# Patient Record
Sex: Female | Born: 1960 | Race: White | Hispanic: No | Marital: Married | Smoking: Former smoker
Health system: Southern US, Community
[De-identification: ages and names within clinical notes are randomized; demographics above are authoritative.]

## PROBLEM LIST (undated history)

## (undated) DIAGNOSIS — Z940 Kidney transplant status: Secondary | ICD-10-CM

## (undated) DIAGNOSIS — C50919 Malignant neoplasm of unspecified site of unspecified female breast: Secondary | ICD-10-CM

## (undated) DIAGNOSIS — Z8679 Personal history of other diseases of the circulatory system: Secondary | ICD-10-CM

## (undated) DIAGNOSIS — Z8632 Personal history of gestational diabetes: Secondary | ICD-10-CM

## (undated) DIAGNOSIS — D631 Anemia in chronic kidney disease: Secondary | ICD-10-CM

## (undated) DIAGNOSIS — Z923 Personal history of irradiation: Secondary | ICD-10-CM

## (undated) DIAGNOSIS — I10 Essential (primary) hypertension: Secondary | ICD-10-CM

## (undated) DIAGNOSIS — Z853 Personal history of malignant neoplasm of breast: Principal | ICD-10-CM

## (undated) DIAGNOSIS — I671 Cerebral aneurysm, nonruptured: Secondary | ICD-10-CM

## (undated) DIAGNOSIS — N039 Chronic nephritic syndrome with unspecified morphologic changes: Secondary | ICD-10-CM

## (undated) HISTORY — DX: Personal history of malignant neoplasm of breast: Z85.3

## (undated) HISTORY — DX: Kidney transplant status: Z94.0

## (undated) HISTORY — DX: Personal history of other diseases of the circulatory system: Z86.79

## (undated) HISTORY — PX: KIDNEY TRANSPLANT: SHX239

## (undated) HISTORY — DX: Anemia in chronic kidney disease: D63.1

## (undated) HISTORY — PX: CEREBRAL ANEURYSM REPAIR: SHX164

## (undated) HISTORY — DX: Cerebral aneurysm, nonruptured: I67.1

## (undated) HISTORY — DX: Chronic nephritic syndrome with unspecified morphologic changes: N03.9

## (undated) HISTORY — PX: BREAST BIOPSY: SHX20

## (undated) HISTORY — DX: Personal history of gestational diabetes: Z86.32

## (undated) HISTORY — PX: BREAST LUMPECTOMY: SHX2

---

## 1979-01-28 HISTORY — PX: CEREBRAL ANEURYSM REPAIR: SHX164

## 1998-02-24 ENCOUNTER — Ambulatory Visit (HOSPITAL_COMMUNITY): Admission: RE | Admit: 1998-02-24 | Discharge: 1998-02-24 | Payer: Self-pay | Admitting: Family Medicine

## 1998-02-24 ENCOUNTER — Encounter: Payer: Self-pay | Admitting: Family Medicine

## 1998-04-12 ENCOUNTER — Encounter: Admission: RE | Admit: 1998-04-12 | Discharge: 1998-07-11 | Payer: Self-pay | Admitting: *Deleted

## 1998-08-07 ENCOUNTER — Encounter: Admission: RE | Admit: 1998-08-07 | Discharge: 1998-11-05 | Payer: Self-pay | Admitting: *Deleted

## 1999-11-11 ENCOUNTER — Encounter: Payer: Self-pay | Admitting: Obstetrics and Gynecology

## 1999-11-11 ENCOUNTER — Encounter: Admission: RE | Admit: 1999-11-11 | Discharge: 1999-11-11 | Payer: Self-pay | Admitting: Obstetrics and Gynecology

## 1999-11-15 ENCOUNTER — Encounter: Payer: Self-pay | Admitting: Obstetrics and Gynecology

## 1999-11-15 ENCOUNTER — Encounter: Admission: RE | Admit: 1999-11-15 | Discharge: 1999-11-15 | Payer: Self-pay | Admitting: Obstetrics and Gynecology

## 2001-05-25 ENCOUNTER — Encounter: Payer: Self-pay | Admitting: Nephrology

## 2001-05-25 ENCOUNTER — Encounter: Admission: RE | Admit: 2001-05-25 | Discharge: 2001-05-25 | Payer: Self-pay | Admitting: Nephrology

## 2001-06-07 ENCOUNTER — Encounter (HOSPITAL_COMMUNITY): Admission: RE | Admit: 2001-06-07 | Discharge: 2001-09-05 | Payer: Self-pay | Admitting: Nephrology

## 2001-06-28 ENCOUNTER — Encounter: Admission: RE | Admit: 2001-06-28 | Discharge: 2001-06-28 | Payer: Self-pay | Admitting: Nephrology

## 2001-06-28 ENCOUNTER — Encounter: Payer: Self-pay | Admitting: Nephrology

## 2001-06-30 ENCOUNTER — Encounter: Payer: Self-pay | Admitting: General Surgery

## 2001-06-30 ENCOUNTER — Other Ambulatory Visit: Admission: RE | Admit: 2001-06-30 | Discharge: 2001-06-30 | Payer: Self-pay | Admitting: *Deleted

## 2001-07-01 ENCOUNTER — Ambulatory Visit (HOSPITAL_COMMUNITY): Admission: RE | Admit: 2001-07-01 | Discharge: 2001-07-01 | Payer: Self-pay | Admitting: General Surgery

## 2001-11-16 ENCOUNTER — Encounter (HOSPITAL_COMMUNITY): Admission: RE | Admit: 2001-11-16 | Discharge: 2002-02-14 | Payer: Self-pay | Admitting: Nephrology

## 2002-02-23 ENCOUNTER — Encounter (HOSPITAL_COMMUNITY): Admission: RE | Admit: 2002-02-23 | Discharge: 2002-05-24 | Payer: Self-pay | Admitting: Nephrology

## 2002-04-25 ENCOUNTER — Encounter: Admission: RE | Admit: 2002-04-25 | Discharge: 2002-07-24 | Payer: Self-pay

## 2002-07-05 ENCOUNTER — Encounter: Admission: RE | Admit: 2002-07-05 | Discharge: 2002-07-05 | Payer: Self-pay | Admitting: Family Medicine

## 2002-07-05 ENCOUNTER — Encounter: Payer: Self-pay | Admitting: Family Medicine

## 2003-05-25 ENCOUNTER — Encounter (HOSPITAL_COMMUNITY): Admission: RE | Admit: 2003-05-25 | Discharge: 2003-08-23 | Payer: Self-pay | Admitting: Nephrology

## 2003-07-05 ENCOUNTER — Ambulatory Visit (HOSPITAL_COMMUNITY): Admission: RE | Admit: 2003-07-05 | Discharge: 2003-07-05 | Payer: Self-pay | Admitting: Gastroenterology

## 2003-07-05 ENCOUNTER — Encounter (INDEPENDENT_AMBULATORY_CARE_PROVIDER_SITE_OTHER): Payer: Self-pay | Admitting: *Deleted

## 2003-08-15 ENCOUNTER — Encounter: Admission: RE | Admit: 2003-08-15 | Discharge: 2003-08-15 | Payer: Self-pay | Admitting: Family Medicine

## 2004-07-25 ENCOUNTER — Encounter (HOSPITAL_COMMUNITY): Admission: RE | Admit: 2004-07-25 | Discharge: 2004-08-06 | Payer: Self-pay | Admitting: Anesthesiology

## 2004-09-02 ENCOUNTER — Encounter: Admission: RE | Admit: 2004-09-02 | Discharge: 2004-09-02 | Payer: Self-pay | Admitting: Family Medicine

## 2004-11-04 ENCOUNTER — Encounter (HOSPITAL_COMMUNITY): Admission: RE | Admit: 2004-11-04 | Discharge: 2005-02-02 | Payer: Self-pay | Admitting: Nephrology

## 2005-08-04 ENCOUNTER — Encounter (HOSPITAL_COMMUNITY): Admission: RE | Admit: 2005-08-04 | Discharge: 2005-10-15 | Payer: Self-pay | Admitting: Nephrology

## 2005-12-01 ENCOUNTER — Encounter: Admission: RE | Admit: 2005-12-01 | Discharge: 2005-12-01 | Payer: Self-pay | Admitting: Family Medicine

## 2006-12-17 ENCOUNTER — Encounter: Admission: RE | Admit: 2006-12-17 | Discharge: 2006-12-17 | Payer: Self-pay | Admitting: Family Medicine

## 2007-12-27 ENCOUNTER — Encounter: Admission: RE | Admit: 2007-12-27 | Discharge: 2007-12-27 | Payer: Self-pay | Admitting: Family Medicine

## 2008-01-17 ENCOUNTER — Encounter: Admission: RE | Admit: 2008-01-17 | Discharge: 2008-01-17 | Payer: Self-pay | Admitting: Family Medicine

## 2009-01-22 ENCOUNTER — Encounter: Admission: RE | Admit: 2009-01-22 | Discharge: 2009-01-22 | Payer: Self-pay | Admitting: Family Medicine

## 2009-02-13 ENCOUNTER — Encounter: Admission: RE | Admit: 2009-02-13 | Discharge: 2009-02-13 | Payer: Self-pay | Admitting: Family Medicine

## 2009-02-14 ENCOUNTER — Encounter: Admission: RE | Admit: 2009-02-14 | Discharge: 2009-02-14 | Payer: Self-pay | Admitting: Family Medicine

## 2009-03-05 ENCOUNTER — Encounter: Admission: RE | Admit: 2009-03-05 | Discharge: 2009-03-05 | Payer: Self-pay | Admitting: General Surgery

## 2009-04-02 ENCOUNTER — Ambulatory Visit (HOSPITAL_COMMUNITY): Admission: RE | Admit: 2009-04-02 | Discharge: 2009-04-02 | Payer: Self-pay | Admitting: General Surgery

## 2009-04-02 ENCOUNTER — Other Ambulatory Visit (INDEPENDENT_AMBULATORY_CARE_PROVIDER_SITE_OTHER): Payer: Self-pay | Admitting: General Surgery

## 2009-04-02 ENCOUNTER — Encounter: Admission: RE | Admit: 2009-04-02 | Discharge: 2009-04-02 | Payer: Self-pay | Admitting: General Surgery

## 2009-04-20 ENCOUNTER — Ambulatory Visit: Payer: Self-pay | Admitting: Oncology

## 2009-05-21 ENCOUNTER — Ambulatory Visit: Admission: RE | Admit: 2009-05-21 | Discharge: 2009-07-19 | Payer: Self-pay | Admitting: Radiation Oncology

## 2009-06-13 ENCOUNTER — Encounter: Admission: RE | Admit: 2009-06-13 | Discharge: 2009-06-13 | Payer: Self-pay | Admitting: Oncology

## 2009-07-03 ENCOUNTER — Ambulatory Visit: Payer: Self-pay | Admitting: Oncology

## 2009-07-06 ENCOUNTER — Other Ambulatory Visit: Admission: RE | Admit: 2009-07-06 | Discharge: 2009-07-06 | Payer: Self-pay | Admitting: Family Medicine

## 2009-08-31 ENCOUNTER — Ambulatory Visit: Payer: Self-pay | Admitting: Oncology

## 2009-10-09 ENCOUNTER — Encounter (HOSPITAL_COMMUNITY): Admission: RE | Admit: 2009-10-09 | Discharge: 2009-10-18 | Payer: Self-pay | Admitting: Nephrology

## 2010-02-16 ENCOUNTER — Encounter: Payer: Self-pay | Admitting: Nephrology

## 2010-02-17 ENCOUNTER — Encounter: Payer: Self-pay | Admitting: Family Medicine

## 2010-03-07 ENCOUNTER — Other Ambulatory Visit: Payer: Self-pay | Admitting: Radiation Oncology

## 2010-03-07 DIAGNOSIS — Z9889 Other specified postprocedural states: Secondary | ICD-10-CM

## 2010-03-08 ENCOUNTER — Ambulatory Visit: Payer: Self-pay | Attending: Radiation Oncology | Admitting: Radiation Oncology

## 2010-04-01 ENCOUNTER — Ambulatory Visit
Admission: RE | Admit: 2010-04-01 | Discharge: 2010-04-01 | Disposition: A | Discharge status: Home / Self Care | Payer: Self-pay | Source: Ambulatory Visit | Attending: Radiation Oncology | Admitting: Radiation Oncology

## 2010-04-01 DIAGNOSIS — Z9889 Other specified postprocedural states: Secondary | ICD-10-CM

## 2010-04-16 ENCOUNTER — Encounter (HOSPITAL_COMMUNITY): Payer: 59 | Attending: Nephrology

## 2010-04-16 DIAGNOSIS — D509 Iron deficiency anemia, unspecified: Secondary | ICD-10-CM | POA: Insufficient documentation

## 2010-04-21 LAB — DIFFERENTIAL
Basophils Relative: 0 % (ref 0–1)
Lymphocytes Relative: 8 % — ABNORMAL LOW (ref 12–46)
Lymphs Abs: 0.6 10*3/uL — ABNORMAL LOW (ref 0.7–4.0)
Monocytes Absolute: 0.4 10*3/uL (ref 0.1–1.0)
Neutrophils Relative %: 88 % — ABNORMAL HIGH (ref 43–77)

## 2010-04-21 LAB — CBC
HCT: 31 % — ABNORMAL LOW (ref 36.0–46.0)
MCV: 78.6 fL (ref 78.0–100.0)
RDW: 14.3 % (ref 11.5–15.5)

## 2010-04-21 LAB — COMPREHENSIVE METABOLIC PANEL
ALT: 22 U/L (ref 0–35)
Alkaline Phosphatase: 56 U/L (ref 39–117)
BUN: 30 mg/dL — ABNORMAL HIGH (ref 6–23)
CO2: 23 mEq/L (ref 19–32)
Calcium: 8.4 mg/dL (ref 8.4–10.5)
GFR calc non Af Amer: 36 mL/min — ABNORMAL LOW (ref >60)
Glucose, Bld: 175 mg/dL — ABNORMAL HIGH (ref 70–99)
Sodium: 139 mEq/L (ref 135–145)
Total Bilirubin: 0.3 mg/dL (ref 0.3–1.2)

## 2010-04-21 LAB — GLUCOSE, CAPILLARY
Glucose-Capillary: 154 mg/dL — ABNORMAL HIGH (ref 70–99)
Glucose-Capillary: 207 mg/dL — ABNORMAL HIGH (ref 70–99)

## 2010-04-22 ENCOUNTER — Encounter (HOSPITAL_COMMUNITY): Payer: 59

## 2010-06-07 ENCOUNTER — Other Ambulatory Visit: Payer: Self-pay | Admitting: Oncology

## 2010-06-07 ENCOUNTER — Encounter (HOSPITAL_BASED_OUTPATIENT_CLINIC_OR_DEPARTMENT_OTHER): Payer: 59 | Admitting: Oncology

## 2010-06-07 DIAGNOSIS — Z170 Estrogen receptor positive status [ER+]: Secondary | ICD-10-CM

## 2010-06-07 DIAGNOSIS — C50419 Malignant neoplasm of upper-outer quadrant of unspecified female breast: Secondary | ICD-10-CM

## 2010-06-07 LAB — CBC WITH DIFFERENTIAL/PLATELET
BASO%: 0.1 % (ref 0.0–2.0)
EOS%: 0.1 % (ref 0.0–7.0)
MCH: 27.3 pg (ref 25.1–34.0)
MCV: 85.9 fL (ref 79.5–101.0)
MONO%: 3.4 % (ref 0.0–14.0)
RBC: 3.96 10*6/uL (ref 3.70–5.45)
RDW: 14.6 % — ABNORMAL HIGH (ref 11.2–14.5)
lymph#: 0.6 10*3/uL — ABNORMAL LOW (ref 0.9–3.3)
nRBC: 0 % (ref 0–0)

## 2010-06-13 ENCOUNTER — Encounter (HOSPITAL_BASED_OUTPATIENT_CLINIC_OR_DEPARTMENT_OTHER): Payer: 59 | Admitting: Oncology

## 2010-06-13 DIAGNOSIS — Z940 Kidney transplant status: Secondary | ICD-10-CM

## 2010-06-13 DIAGNOSIS — C50419 Malignant neoplasm of upper-outer quadrant of unspecified female breast: Secondary | ICD-10-CM

## 2010-06-13 DIAGNOSIS — Z170 Estrogen receptor positive status [ER+]: Secondary | ICD-10-CM

## 2010-06-14 NOTE — Op Note (Signed)
NAME:  Vanessa [name], Vanessa [name]                     ACCOUNT NO.:  [id number]   MEDICAL RECORD NO.:  [id number]                   PATIENT TYPE:  AMB   LOCATION:  ENDO                                 FACILITY:  [facility]   PHYSICIAN:  Danise Edge, M.D.                DATE OF BIRTH:  10-Dec-1960   DATE OF PROCEDURE:  07/05/2003  DATE OF DISCHARGE:                                 OPERATIVE REPORT   PROCEDURE:  Colonoscopy with polypectomy.   INDICATIONS:  Ms. Tionna Gigante is a 50 year old female born Apr 29, 1960.  Ms. Spradlin has iron deficiency anemia, based on a hemoglobin  of 9.0 and a low serum ferritin.  She reports no gastrointestinal bleeding.  Her father underwent surgery for colon cancer at age of 13.  Her twin sister  had a screening colonoscopy which was normal.  Her younger sister had a  screening colonoscopy, with removal of a colon polyp.   MEDICATION ALLERGIES:  NONE.   CHRONIC MEDICATIONS:  1. Prograf.  2. CellCept.  3. Prednisone.  4. Amaryl.  5. Lasix.  6. Atenolol.  7. Multivitamin.   PAST MEDICAL HISTORY:  1. End-stage kidney disease.  2. 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  3. Pre-eclampsia with her second child.  4. Living related donor kidney transplant, August 27, 2001.  5. Hypertension.  6. Obesity.  7. Type 2 diabetes mellitus.  8. Status post kidney transplant.  9. 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   FAMILY HISTORY:  Father diagnosed with colon cancer at age 13.  Sister with  colon polyp removed colonoscopically.   ENDOSCOPIST:  Charolett Bumpers, M.D.   PREMEDICATION:  1. Demerol 50 mg.  2. Versed 7.5 mg.   DESCRIPTION OF PROCEDURE:  After obtaining informed consent, Ms. Quimby  was placed in the left lateral decubitus position.  I administered  intravenous Demerol and intravenous Versed to achieve conscious sedation for  the procedure.  The patient's blood pressure, oxygen  saturation and cardiac  rhythm were monitored throughout the procedure and documented in the medical  record.   Anal inspection and digital rectal examinations were  normal.  The Olympus  adjustable pediatric video colonoscope was introduced into the rectum and  easily advanced to the cecum.  Colonic preparation for the examination today  was excellent.   FINDINGS:  RECTUM:  Normal.  SIGMOID COLON and DESCENDING COLON:  At approximately 50 cm from the anal  verge, two 2 mm polyps were removed with the electrocautery snare; submitted  for pathologic interpretation.  SPLENIC FLEXURE:  Normal.  TRANSVERSE COLON:  Normal.  HEPATIC FLEXURE:  Normal.  ASCENDING COLON:  Normal.  CECUM AND ILEOCECAL VALVE:  Normal.   ASSESSMENT:  1. Two small polyps were removed from the left colon with the electrocautery     snare; submitted for pathologic interpretation.  2. Otherwise normal proctocolonoscopy to  the cecum.   RECOMMENDATIONS:  Repeat colonoscopy in five years.                                               Danise Edge, M.D.    MJ/MEDQ  D:  07/05/2003  T:  07/05/2003  Job:  [id number]   cc:   Wilber Bihari. Caryn Section, M.D.  [address]  [place]  [place] [address]  Fax: [phone]   Elana Alm. Nicholos Johns, M.D.  [address]  [place]  [place] [address]  Fax: [phone]

## 2010-10-16 ENCOUNTER — Other Ambulatory Visit: Payer: Self-pay | Admitting: Family Medicine

## 2010-10-16 ENCOUNTER — Other Ambulatory Visit (HOSPITAL_COMMUNITY)
Admission: RE | Admit: 2010-10-16 | Discharge: 2010-10-16 | Disposition: A | Discharge status: Home / Self Care | Payer: 59 | Source: Ambulatory Visit | Attending: Family Medicine | Admitting: Family Medicine

## 2010-10-16 DIAGNOSIS — Z01419 Encounter for gynecological examination (general) (routine) without abnormal findings: Secondary | ICD-10-CM | POA: Insufficient documentation

## 2011-02-27 ENCOUNTER — Other Ambulatory Visit: Payer: Self-pay | Admitting: Radiation Oncology

## 2011-02-27 DIAGNOSIS — Z9889 Other specified postprocedural states: Secondary | ICD-10-CM

## 2011-02-27 DIAGNOSIS — Z853 Personal history of malignant neoplasm of breast: Secondary | ICD-10-CM

## 2011-04-02 ENCOUNTER — Ambulatory Visit
Admission: RE | Admit: 2011-04-02 | Discharge: 2011-04-02 | Disposition: A | Discharge status: Home / Self Care | Payer: BC Managed Care – PPO | Source: Ambulatory Visit | Attending: Radiation Oncology | Admitting: Radiation Oncology

## 2011-04-02 DIAGNOSIS — Z853 Personal history of malignant neoplasm of breast: Secondary | ICD-10-CM

## 2011-04-02 DIAGNOSIS — Z9889 Other specified postprocedural states: Secondary | ICD-10-CM

## 2011-06-06 ENCOUNTER — Other Ambulatory Visit: Payer: Self-pay | Admitting: Oncology

## 2011-06-09 ENCOUNTER — Other Ambulatory Visit: Payer: 59 | Admitting: Lab

## 2011-06-16 ENCOUNTER — Ambulatory Visit: Payer: 59 | Admitting: Oncology

## 2012-02-08 ENCOUNTER — Emergency Department (HOSPITAL_COMMUNITY)
Admission: EM | Admit: 2012-02-08 | Discharge: 2012-02-08 | Disposition: A | Discharge status: Home / Self Care | Payer: BC Managed Care – PPO | Source: Home / Self Care | Attending: Emergency Medicine | Admitting: Emergency Medicine

## 2012-02-08 ENCOUNTER — Encounter (HOSPITAL_COMMUNITY): Payer: Self-pay | Admitting: *Deleted

## 2012-02-08 ENCOUNTER — Emergency Department (INDEPENDENT_AMBULATORY_CARE_PROVIDER_SITE_OTHER): Payer: BC Managed Care – PPO

## 2012-02-08 DIAGNOSIS — M109 Gout, unspecified: Secondary | ICD-10-CM

## 2012-02-08 LAB — URIC ACID: Uric Acid, Serum: 8.4 mg/dL — ABNORMAL HIGH (ref 2.4–7.0)

## 2012-02-08 MED ORDER — COLCHICINE 0.6 MG PO TABS: ORAL_TABLET | ORAL | Status: DC

## 2012-02-08 MED ORDER — PREDNISONE 20 MG PO TABS: 20.0000 mg | ORAL_TABLET | Freq: Two times a day (BID) | ORAL | Status: DC

## 2012-02-08 NOTE — ED Provider Notes (Signed)
Chief Complaint  Patient presents with  . Foot Pain    History of Present Illness:   Vanessa [name] is a 52 year old female who has had a three-week history of pain over the dorsum of the right foot. This got better then got worse. She denies any injury to the foot or leg. It's swollen, red, hot, and tender. She denies any fever, chills, other joint pain, or previous history of pain or swelling like this before or history of gout. She does have a history of chronic kidney disease. She donated a kidney, then her remaining kidney failed and she had to have transplant. She follows up with her nephrologist once a year. Her kidney function has been good.  Review of Systems:  Other than noted above, the patient denies any of the following symptoms: Systemic:  No fevers, chills, sweats, or aches.  No fatigue or tiredness. Musculoskeletal:  No joint pain, arthritis, bursitis, swelling, back pain, or neck pain. Neurological:  No muscular weakness, paresthesias, headache, or trouble with speech or coordination.  No dizziness.  PMFSH:  Past medical history, family history, social history, meds, and allergies were reviewed.  Physical Exam:   Vital signs:  BP 131/77  Pulse 80  Temp 98.5 F (36.9 C) (Oral)  Resp 16  SpO2 98%  LMP 01/16/2012 Gen:  Alert and oriented times 3.  In no distress. Musculoskeletal:  There was erythema, swelling, and tenderness to palpation, and heat over the dorsum of the foot.  Otherwise, all joints had a full a ROM with no swelling, bruising or deformity.  No edema, pulses full. Extremities were warm and pink.  Capillary refill was brisk.  Skin:  Clear, warm and dry.  No rash. Neuro:  Alert and oriented times 3.  Muscle strength was normal.  Sensation was intact to light touch.   Radiology:  Dg Foot Complete Right  02/08/2012  *RADIOLOGY REPORT*  Clinical Data: Pain, swelling dorsally  RIGHT FOOT COMPLETE - 3+ VIEW  Comparison: None.  Findings: Normal alignment without acute  fracture.  Preserved joint spaces.  No significant arthropathy.  No focal soft tissue swelling.  Calcifications along the Achilles tendon posteriorly compatible with chronic tendonitis.  IMPRESSION: No acute osseous finding.   Original Report Authenticated By: Judie Petit. Miles Costain, M.D.    I reviewed the images independently and personally and concur with the radiologist's findings.  Results for orders placed during the hospital encounter of 02/08/12  URIC ACID      Component Value Range   Uric Acid, Serum 8.4 (*) 2.4 - 7.0 mg/dL   Assessment:  The encounter diagnosis was Gout.  Plan:   1.  The following meds were prescribed:   New Prescriptions   COLCHICINE 0.6 MG TABLET    Take 2 now and 1 in 1 hour.  May repeat dose once daily.  For gout attack.   PREDNISONE (DELTASONE) 20 MG TABLET    Take 1 tablet (20 mg total) by mouth 2 (two) times daily.   2.  The patient was instructed in symptomatic care, including rest and activity, elevation, application of ice and compression.  Appropriate handouts were given. 3.  The patient was told to return if becoming worse in any way, if no better in 3 or 4 days, and given some red flag symptoms that would indicate earlier return.   4.  The patient was told to follow up with her primary care physician about the elevated uric acid.    Reuben Likes, MD 02/08/12 1501

## 2012-02-08 NOTE — ED Notes (Signed)
Paptient complains of right foot pain that started 2-3 weeks ago but went away; pain came back last Thursday and progessed to point of not being able to bear weight on right foot. Some swelling +1 and redness right foot. Denies nausea, vomiting, diarrhea, fever/chills.

## 2012-04-01 ENCOUNTER — Other Ambulatory Visit: Payer: Self-pay | Admitting: Family Medicine

## 2012-04-01 DIAGNOSIS — Z853 Personal history of malignant neoplasm of breast: Secondary | ICD-10-CM

## 2012-04-06 ENCOUNTER — Other Ambulatory Visit: Payer: Self-pay | Admitting: Oncology

## 2012-05-06 ENCOUNTER — Other Ambulatory Visit: Payer: Self-pay | Admitting: Emergency Medicine

## 2012-05-06 MED ORDER — TAMOXIFEN CITRATE 20 MG PO TABS: 20.0000 mg | ORAL_TABLET | Freq: Every day | ORAL | Status: DC

## 2012-05-12 ENCOUNTER — Ambulatory Visit
Admission: RE | Admit: 2012-05-12 | Discharge: 2012-05-12 | Disposition: A | Discharge status: Home / Self Care | Payer: BC Managed Care – PPO | Source: Ambulatory Visit | Attending: Family Medicine | Admitting: Family Medicine

## 2012-05-12 DIAGNOSIS — Z853 Personal history of malignant neoplasm of breast: Secondary | ICD-10-CM

## 2012-12-15 ENCOUNTER — Other Ambulatory Visit: Payer: Self-pay | Admitting: Oncology

## 2012-12-15 DIAGNOSIS — C50412 Malignant neoplasm of upper-outer quadrant of left female breast: Secondary | ICD-10-CM

## 2012-12-15 NOTE — Telephone Encounter (Signed)
Dr. Darnelle Catalan notified of eRx Tamoxifen refill request.  Patient seen last on 06-13-2010.  FTKA 06-16-2011 with no pending appointments.  Noted 05-12-2012 mammogram was ordered by Dr. Elias Else with [facility] [phone]).  Verbal order received and read back from Dr. Darnelle Catalan for two months refill with f/u appointment wit [facility].  Called Mrs. Vanessa [name] to inform her the tamoxifen will be refilled x 2 months with an appointment request for January with Vanessa [name].  If appointment is not kept no further refills.  Requests an appointment as late as possible.  Will notify schedulers of this request.

## 2012-12-16 ENCOUNTER — Telehealth: Payer: Self-pay | Admitting: Oncology

## 2012-12-16 NOTE — Telephone Encounter (Signed)
, °

## 2013-02-02 ENCOUNTER — Telehealth: Payer: Self-pay | Admitting: *Deleted

## 2013-02-02 NOTE — Telephone Encounter (Signed)
Pt called to cancel her appts for 1.20.15 and to rs. gv appts for 2.18.15 w/ labs@ 2:45p and ov@ 3:15p. Pt is aware...td

## 2013-02-15 ENCOUNTER — Other Ambulatory Visit: Payer: BC Managed Care – PPO

## 2013-02-15 ENCOUNTER — Ambulatory Visit: Payer: BC Managed Care – PPO | Admitting: Physician Assistant

## 2013-02-19 ENCOUNTER — Other Ambulatory Visit: Payer: Self-pay | Admitting: Oncology

## 2013-03-07 ENCOUNTER — Other Ambulatory Visit: Payer: Self-pay | Admitting: Family Medicine

## 2013-03-07 DIAGNOSIS — Z853 Personal history of malignant neoplasm of breast: Secondary | ICD-10-CM

## 2013-03-16 ENCOUNTER — Telehealth: Payer: Self-pay | Admitting: Oncology

## 2013-03-16 ENCOUNTER — Ambulatory Visit (HOSPITAL_BASED_OUTPATIENT_CLINIC_OR_DEPARTMENT_OTHER): Payer: BC Managed Care – PPO | Admitting: Physician Assistant

## 2013-03-16 ENCOUNTER — Encounter: Payer: Self-pay | Admitting: Physician Assistant

## 2013-03-16 ENCOUNTER — Encounter (INDEPENDENT_AMBULATORY_CARE_PROVIDER_SITE_OTHER): Payer: Self-pay

## 2013-03-16 ENCOUNTER — Other Ambulatory Visit (HOSPITAL_BASED_OUTPATIENT_CLINIC_OR_DEPARTMENT_OTHER): Payer: BC Managed Care – PPO

## 2013-03-16 VITALS — BP 117/70 | HR 73 | Temp 98.8°F | Resp 18 | Ht 62.0 in | Wt 105.8 lb

## 2013-03-16 DIAGNOSIS — Z170 Estrogen receptor positive status [ER+]: Secondary | ICD-10-CM

## 2013-03-16 DIAGNOSIS — C50412 Malignant neoplasm of upper-outer quadrant of left female breast: Secondary | ICD-10-CM

## 2013-03-16 DIAGNOSIS — Z940 Kidney transplant status: Secondary | ICD-10-CM

## 2013-03-16 DIAGNOSIS — C50119 Malignant neoplasm of central portion of unspecified female breast: Secondary | ICD-10-CM

## 2013-03-16 DIAGNOSIS — Z8632 Personal history of gestational diabetes: Secondary | ICD-10-CM | POA: Insufficient documentation

## 2013-03-16 DIAGNOSIS — Z853 Personal history of malignant neoplasm of breast: Secondary | ICD-10-CM | POA: Insufficient documentation

## 2013-03-16 DIAGNOSIS — I671 Cerebral aneurysm, nonruptured: Secondary | ICD-10-CM | POA: Insufficient documentation

## 2013-03-16 DIAGNOSIS — N189 Chronic kidney disease, unspecified: Secondary | ICD-10-CM

## 2013-03-16 DIAGNOSIS — Z8679 Personal history of other diseases of the circulatory system: Secondary | ICD-10-CM | POA: Insufficient documentation

## 2013-03-16 DIAGNOSIS — D631 Anemia in chronic kidney disease: Secondary | ICD-10-CM

## 2013-03-16 DIAGNOSIS — N039 Chronic nephritic syndrome with unspecified morphologic changes: Secondary | ICD-10-CM

## 2013-03-16 HISTORY — DX: Anemia in chronic kidney disease: D63.1

## 2013-03-16 HISTORY — DX: Kidney transplant status: Z94.0

## 2013-03-16 HISTORY — DX: Personal history of malignant neoplasm of breast: Z85.3

## 2013-03-16 HISTORY — DX: Personal history of other diseases of the circulatory system: Z86.79

## 2013-03-16 HISTORY — DX: Personal history of gestational diabetes: Z86.32

## 2013-03-16 HISTORY — DX: Cerebral aneurysm, nonruptured: I67.1

## 2013-03-16 LAB — CBC WITH DIFFERENTIAL/PLATELET
BASO%: 0.3 % (ref 0.0–2.0)
BASOS ABS: 0 10*3/uL (ref 0.0–0.1)
EOS ABS: 0 10*3/uL (ref 0.0–0.5)
EOS%: 0.2 % (ref 0.0–7.0)
HEMATOCRIT: 29.3 % — AB (ref 34.8–46.6)
HEMOGLOBIN: 9.3 g/dL — AB (ref 11.6–15.9)
LYMPH%: 7.5 % — AB (ref 14.0–49.7)
MCH: 28.2 pg (ref 25.1–34.0)
MCHC: 31.6 g/dL (ref 31.5–36.0)
MCV: 89 fL (ref 79.5–101.0)
MONO#: 0.3 10*3/uL (ref 0.1–0.9)
MONO%: 4.9 % (ref 0.0–14.0)
NEUT%: 87.1 % — ABNORMAL HIGH (ref 38.4–76.8)
NEUTROS ABS: 5.7 10*3/uL (ref 1.5–6.5)
PLATELETS: 238 10*3/uL (ref 145–400)
RBC: 3.29 10*6/uL — ABNORMAL LOW (ref 3.70–5.45)
RDW: 14.5 % (ref 11.2–14.5)
WBC: 6.6 10*3/uL (ref 3.9–10.3)
lymph#: 0.5 10*3/uL — ABNORMAL LOW (ref 0.9–3.3)

## 2013-03-16 MED ORDER — TAMOXIFEN CITRATE 20 MG PO TABS: 20.0000 mg | ORAL_TABLET | Freq: Every day | ORAL | Status: DC

## 2013-03-16 NOTE — Progress Notes (Signed)
[facility]  Telephone:([phone]) [phone] Fax:(336) [phone]     ID: Vanessa Vanessa [name] OB: 1960-06-02  MR#: [id number]  DJM#:[id number]  PCP: Vanessa Austria, MD GYN:   SU: Vanessa Messing, MD [place]:  Vanessa Silversmith, MD OTHER MD: Vanessa Prose, MD;  Vanessa Skeens, MD;  Vanessa Grippe, MD  CHIEF COMPLAINT:  Hx left Breast Cancer  HISTORY OF PRESENT ILLNESS: From Vanessa Vanessa [name] original office note, dated 05/02/2009:  Vanessa Vanessa [name] (pronounced March-e-soda) had routine mammography 01/17/2009 which suggested a possible mass in the left breast.  She was recalled for diagnostic mammography and ultrasonography 02/13/2009.This showed very dense breasts but spot compression views confirmed a mass-like density in the upper central portion of the left breast, measuring up to 2.1 cm.  This was not palpable by Vanessa Vanessa [name].  An ultrasound showed only normal appearing fibroglandular tissue.    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  The tumor was strongly estrogen receptor positive at 98%, progesterone receptor positive at 99%, with a borderline high proliferation fraction at 23%, and Her-2 negative by CISH with a ratio of 1.36.    With this information, the patient was referred to Vanessa Vanessa [name], and bilateral breast MRIs, and she was set up for breast-specific gamma imaging at [facility] (the patient was not able to have an MRI because of prior surgeries) on February 23, 2009.  This showed intense focal increased activity at 11 o'clock in the left breast, corresponding with the prior biopsy site.  There was also patchy increased activity through both breasts, most prominently in the upper outer quadrant bilaterally.  Repeat ultrasound of both upper outer quadrants and biopsies were suggested.  These were performed on February 7th at [facility] and Vanessa Vanessa [name]  found multiple hypoechoic nodularities bilaterally.  In particular, in the upper outer quadrant of the right breast, there was a hypoechoic nodule at 11 o'clock and in the left breast, a similar nodule at 3 o'clock.  These two areas were biopsied ([id number]) and showed only fibrocytic changes.    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  No evidence of lymphovascular invasion and 0 of 6 lymph nodes involved.  (These 6 lymph nodes were hot and had blue dye per Vanessa Vanessa [name] description).  The patient accordingly stages as a T1b N0 MX.    Subsequent treatment history is as follows.   INTERVAL HISTORY: Vanessa Vanessa [name] returns alone today for routine followup of her left breast carcinoma, it has been almost 3 years since she was seen here in our office, her last visit having been in may of 2012. Overall, she tells me she is doing well. She has continued on tamoxifen with no complications, and specifically no hot flashes, vaginal changes, abnormal vaginal bleeding, or blood clots.  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 This was followed by her nephrologist, Dr. Joelyn [name], and she sees him at least every 3 months. She has been getting Aranesp through his office. Her hemoglobin was a little lower than usual today at 9.3. For the most part, she is asymptomatic, with no increased fatigue or shortness of breath. She denies  any signs of abnormal bleeding. I will mention that she is past due for her screening colonoscopy, last done in 2005.   REVIEW OF SYSTEMS: Vanessa Vanessa [name] has had no recent illnesses and denies any fevers or chills. She's had no rashes or skin changes and denies any abnormal bleeding. Her appetite is  good, and she has had no nausea or emesis. She denies any change in bowel or bladder habits. She is exercising on a daily basis, typically walking. She denies any cough, increased shortness of breath, peripheral swelling, chest pain, or palpitations. She also denies any abnormal headaches, dizziness, or change in vision, and has had no new or unusual myalgias, arthralgias, or bony pain.  A detailed review of systems is otherwise stable and noncontributory.   PAST MEDICAL HISTORY: Past Medical History  Diagnosis Date  . History of left breast cancer 03/16/2013  . Anemia in chronic kidney disease(285.21) 03/16/2013  . H/O kidney transplant 03/16/2013  . Brain aneurysm 03/16/2013    Age 53 (1981)  . Hx of primary hypertension 03/16/2013  . Hx gestational diabetes 03/16/2013    PAST SURGICAL HISTORY: History reviewed. No pertinent past surgical history.  FAMILY HISTORY:  (Updated February 2015) History reviewed. No pertinent family history. The patient's father is alive at age 80.  He has diabetes and Parkinson's disease. The patient's mother is 3.  She apparently had a glioblastoma removed and treated at [facility] in September of 2009.  This recurred in February 2014, but she is currenty doing well.  Again, her mother was the donor for Vanessa Vanessa [name]'s kidney.  The patient has her twin sister who is doing very well and a younger sister.  There is no history of breast or ovarian cancer in the family.    GYNECOLOGIC HISTORY: (Updated February 2015)  She is GX P2, first pregnancy to term at age 53.   Her periods are irregular, LMP March 2014.   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 Her husband of 30+ years, Vanessa Vanessa [name] (goes by Vanessa Vanessa [name]) is self-employed in maintenance although currently unemployed.  Their 55 year old Vanessa [name], Vanessa Vanessa [name], works full-time and is also in school.  Daughter Vanessa Vanessa [name], 13, goes to [employer].  The patient attends a friend's  church.    ADVANCED DIRECTIVES:    HEALTH MAINTENANCE:  (Updated February 2015) History  Substance Use Topics  . Smoking status: Former Smoker -- 0.50 packs/day for 2 years    Types: Cigarettes  . Smokeless tobacco: Never Used  . Alcohol Use: No     Colonoscopy:  2005/Dr. Wynetta Emery (was due again in 2010)  PAP:  Sept 2012/Dr. Sun  Bone density:  May 2011, Normal   Lipid panel:  [facility], Dr. Alyson Ingles   No Known Allergies  Current Outpatient Prescriptions  Medication Sig Dispense Refill  . colchicine 0.6 MG tablet Take 2 now and 1 in 1 hour.  May repeat dose once daily.  For gout attack.  12 tablet  0  . furosemide (LASIX) 20 MG tablet Take 20 mg by mouth daily. Take 1/2 tablet daily.      . mycophenolate (CELLCEPT) 500 MG tablet       . predniSONE (DELTASONE) 20 MG tablet Take 1 tablet (20 mg total) by mouth 2 (two) times daily.  10 tablet  0  . SYNTHROID 137 MCG tablet       . tacrolimus (PROGRAF) 1 MG capsule       . tamoxifen (NOLVADEX) 20 MG tablet  Take 1 tablet (20 mg total) by mouth daily.  90 tablet  3  . ULORIC 40 MG tablet       . amoxicillin (AMOXIL) 500 MG capsule        No current facility-administered medications for this visit.    OBJECTIVE:  Well-appearing Caucasian female, in no acute distress Filed Vitals:   03/16/13 1525  BP: 117/70  Pulse: 73  Temp: 98.8 F (37.1 C)  Resp: 18     Body mass index is 19.35 kg/(m^2).    ECOG FS: 0 Filed Weights   03/16/13 1525  Weight: 105 lb 12.8 oz (47.991 kg)   Physical Exam: HEENT:  Sclerae anicteric. Conjunctivae pale.  Oropharynx clear and moist. Neck supple, trachea midline. Palpable thyromegaly.  NODES:  No cervical or supraclavicular lymphadenopathy palpated.  BREAST EXAM:  Right breast is unremarkable. Left breast is status post lumpectomy with no suspicious nodularities or skin changes, and no evidence of local recurrence. Breast tissue is dense bilaterally. Axillae are benign bilaterally, no palpable  lymphadenopathy. LUNGS:  Clear to auscultation bilaterally with good excursion.  No wheezes or rhonchi HEART:  Regular rate and rhythm.  ABDOMEN:  Soft, thin, nontender.  Positive bowel sounds.  MSK:  No focal spinal tenderness to palpation. Good range of motion bilaterally in the upper extremities. EXTREMITIES:  No peripheral edema.   SKIN:  Benign with no visible rashes or skin lesions. No excessive ecchymoses and no petechiae. Mild pallor. NEURO:  Nonfocal. Well oriented.  Appropriate affect.     LAB RESULTS:    Lab Results  Component Value Date   WBC 6.6 03/16/2013   NEUTROABS 5.7 03/16/2013   HGB 9.3* 03/16/2013   HCT 29.3* 03/16/2013   MCV 89.0 03/16/2013   PLT 238 03/16/2013      Chemistry      Component Value Date/Time   NA 139 04/02/2009 0924   K 3.9 04/02/2009 0924   CL 111 04/02/2009 0924   CO2 23 04/02/2009 0924   BUN 30* 04/02/2009 0924   CREATININE 1.51* 04/02/2009 0924      Component Value Date/Time   CALCIUM 8.4 04/02/2009 0924   ALKPHOS 56 04/02/2009 0924   AST 18 04/02/2009 0924   ALT 22 04/02/2009 0924   BILITOT 0.3 04/02/2009 0924      STUDIES:  Most recent bilateral mammogram was 05/12/2012 was unremarkable with the exception of dense breast tissue bilaterally  Most recent documented bone density was in 2011, normal.  ASSESSMENT: 53 y.o. Vanessa Vanessa [name] woman: 1. Remote History of Renal transplant, with persistent anemia followed by her nephrologist, Dr. Joelyn [name]. Receives Aranesp injections as needed. 2. Status post left lumpectomy and sentinel lymph node sampling March 2011 for a T1b N0 grade 1 invasive ductal carcinoma, with an MIB - 1 of 23%.  The tumor was strongly estrogen and progesterone receptor positive, HER-2 negative.   3. She completed radiation in June 2011, at which point she started tamoxifen.      PLAN: Over half of our 45 minute appointment today was spent reviewing the patient's recent history since she had not been seen for almost 3 years. We  reviewed her history, answered her questions, discussed her treatment plan, and coordinating care.   With regards to her breast cancer, January seems to be doing well, and I have refilled her tamoxifen for another he is you're. We will see her again next February. The original plan was to continue tamoxifen for 5 years (through June 2016), but we  will need to discuss whether or not to discontinue tamoxifen after 5 years, or continue for total of 10.  In the meanwhile, Efrat will continue to be followed closely by her other physicians with regards to her comorbidities, including her anemia. I encouraged her to make an appointment with her gastroenterologist as soon as possible for a screening colonoscopy. It was recommended back in 2005 that this be repeated in 5 years, so she is actually 5 years past due. She is scheduled for her next bilateral mammogram in April, and will have a bone density at the same time.  All this was reviewed in detail with Vanessa Reichmann who voices understanding and agreement with our plan. She knows to call with any changes or problems prior to her next appointment.  Eupha Lobb, PA-C   03/16/2013 4:13 PM

## 2013-03-16 NOTE — Telephone Encounter (Signed)
, °

## 2013-04-23 ENCOUNTER — Other Ambulatory Visit: Payer: Self-pay | Admitting: Oncology

## 2013-05-16 ENCOUNTER — Other Ambulatory Visit: Payer: BC Managed Care – PPO

## 2013-05-20 ENCOUNTER — Other Ambulatory Visit: Payer: BC Managed Care – PPO

## 2013-06-03 ENCOUNTER — Ambulatory Visit
Admission: RE | Admit: 2013-06-03 | Discharge: 2013-06-03 | Disposition: A | Discharge status: Home / Self Care | Payer: BC Managed Care – PPO | Source: Ambulatory Visit | Attending: Physician Assistant | Admitting: Physician Assistant

## 2013-06-03 ENCOUNTER — Ambulatory Visit
Admission: RE | Admit: 2013-06-03 | Discharge: 2013-06-03 | Disposition: A | Discharge status: Home / Self Care | Payer: BC Managed Care – PPO | Source: Ambulatory Visit | Attending: Family Medicine | Admitting: Family Medicine

## 2013-06-03 ENCOUNTER — Encounter (INDEPENDENT_AMBULATORY_CARE_PROVIDER_SITE_OTHER): Payer: Self-pay

## 2013-06-03 DIAGNOSIS — Z853 Personal history of malignant neoplasm of breast: Secondary | ICD-10-CM

## 2013-07-01 ENCOUNTER — Other Ambulatory Visit: Payer: Self-pay | Admitting: Oncology

## 2013-09-27 ENCOUNTER — Other Ambulatory Visit: Payer: Self-pay | Admitting: Oncology

## 2013-09-27 DIAGNOSIS — Z853 Personal history of malignant neoplasm of breast: Secondary | ICD-10-CM

## 2013-11-08 ENCOUNTER — Other Ambulatory Visit: Payer: Self-pay | Admitting: *Deleted

## 2013-11-08 DIAGNOSIS — Z853 Personal history of malignant neoplasm of breast: Secondary | ICD-10-CM

## 2013-11-08 MED ORDER — TAMOXIFEN CITRATE 20 MG PO TABS: ORAL_TABLET | ORAL | Status: DC

## 2014-03-13 ENCOUNTER — Other Ambulatory Visit: Payer: Self-pay | Admitting: Emergency Medicine

## 2014-03-13 DIAGNOSIS — Z853 Personal history of malignant neoplasm of breast: Secondary | ICD-10-CM

## 2014-03-14 ENCOUNTER — Other Ambulatory Visit: Payer: BC Managed Care – PPO

## 2014-03-14 ENCOUNTER — Telehealth: Payer: Self-pay | Admitting: Oncology

## 2014-03-14 NOTE — Telephone Encounter (Signed)
, °

## 2014-03-21 ENCOUNTER — Ambulatory Visit: Payer: BC Managed Care – PPO | Admitting: Oncology

## 2014-03-21 ENCOUNTER — Other Ambulatory Visit: Payer: Self-pay

## 2014-04-03 ENCOUNTER — Other Ambulatory Visit: Payer: Self-pay | Admitting: Oncology

## 2014-04-16 ENCOUNTER — Other Ambulatory Visit: Payer: Self-pay | Admitting: Oncology

## 2014-04-18 ENCOUNTER — Other Ambulatory Visit (HOSPITAL_BASED_OUTPATIENT_CLINIC_OR_DEPARTMENT_OTHER): Payer: BLUE CROSS/BLUE SHIELD

## 2014-04-18 ENCOUNTER — Ambulatory Visit (HOSPITAL_BASED_OUTPATIENT_CLINIC_OR_DEPARTMENT_OTHER): Payer: BLUE CROSS/BLUE SHIELD | Admitting: Oncology

## 2014-04-18 ENCOUNTER — Telehealth: Payer: Self-pay | Admitting: Oncology

## 2014-04-18 VITALS — BP 149/86 | HR 77 | Temp 98.3°F | Resp 18 | Ht 62.0 in | Wt 112.3 lb

## 2014-04-18 DIAGNOSIS — D649 Anemia, unspecified: Secondary | ICD-10-CM

## 2014-04-18 DIAGNOSIS — C50912 Malignant neoplasm of unspecified site of left female breast: Secondary | ICD-10-CM | POA: Insufficient documentation

## 2014-04-18 DIAGNOSIS — Z170 Estrogen receptor positive status [ER+]: Secondary | ICD-10-CM

## 2014-04-18 DIAGNOSIS — D630 Anemia in neoplastic disease: Secondary | ICD-10-CM | POA: Insufficient documentation

## 2014-04-18 DIAGNOSIS — Z940 Kidney transplant status: Secondary | ICD-10-CM

## 2014-04-18 DIAGNOSIS — Z853 Personal history of malignant neoplasm of breast: Secondary | ICD-10-CM

## 2014-04-18 DIAGNOSIS — N189 Chronic kidney disease, unspecified: Secondary | ICD-10-CM

## 2014-04-18 DIAGNOSIS — D631 Anemia in chronic kidney disease: Secondary | ICD-10-CM

## 2014-04-18 LAB — COMPREHENSIVE METABOLIC PANEL (CC13)
ALT: 18 U/L (ref 0–55)
AST: 15 U/L (ref 5–34)
Albumin: 3.3 g/dL — ABNORMAL LOW (ref 3.5–5.0)
Alkaline Phosphatase: 128 U/L (ref 40–150)
Anion Gap: 8 mEq/L (ref 3–11)
BUN: 12.2 mg/dL (ref 7.0–26.0)
CALCIUM: 8.1 mg/dL — AB (ref 8.4–10.4)
CHLORIDE: 108 meq/L (ref 98–109)
CO2: 21 meq/L — AB (ref 22–29)
CREATININE: 0.8 mg/dL (ref 0.6–1.1)
EGFR: 79 mL/min/{1.73_m2} — ABNORMAL LOW (ref >90)
GLUCOSE: 91 mg/dL (ref 70–140)
Potassium: 4 mEq/L (ref 3.5–5.1)
SODIUM: 138 meq/L (ref 136–145)
Total Bilirubin: 0.35 mg/dL (ref 0.20–1.20)
Total Protein: 5.2 g/dL — ABNORMAL LOW (ref 6.4–8.3)

## 2014-04-18 LAB — CBC WITH DIFFERENTIAL/PLATELET
BASO%: 0.2 % (ref 0.0–2.0)
Basophils Absolute: 0 10*3/uL (ref 0.0–0.1)
EOS%: 2 % (ref 0.0–7.0)
Eosinophils Absolute: 0.1 10*3/uL (ref 0.0–0.5)
HCT: 32.8 % — ABNORMAL LOW (ref 34.8–46.6)
HGB: 10.4 g/dL — ABNORMAL LOW (ref 11.6–15.9)
LYMPH#: 0.6 10*3/uL — AB (ref 0.9–3.3)
LYMPH%: 10.2 % — AB (ref 14.0–49.7)
MCH: 28.1 pg (ref 25.1–34.0)
MCHC: 31.7 g/dL (ref 31.5–36.0)
MCV: 88.6 fL (ref 79.5–101.0)
MONO#: 0.4 10*3/uL (ref 0.1–0.9)
MONO%: 5.8 % (ref 0.0–14.0)
NEUT#: 5 10*3/uL (ref 1.5–6.5)
NEUT%: 81.8 % — ABNORMAL HIGH (ref 38.4–76.8)
Platelets: 178 10*3/uL (ref 145–400)
RBC: 3.7 10*6/uL (ref 3.70–5.45)
RDW: 14.9 % — AB (ref 11.2–14.5)
WBC: 6.1 10*3/uL (ref 3.9–10.3)

## 2014-04-18 MED ORDER — PREDNISONE 5 MG PO TABS: 5.0000 mg | ORAL_TABLET | Freq: Every day | ORAL | Status: DC

## 2014-04-18 MED ORDER — TAMOXIFEN CITRATE 20 MG PO TABS
ORAL_TABLET | ORAL | Status: DC
Start: 2014-04-18 — End: 2015-01-06

## 2014-04-18 NOTE — Telephone Encounter (Signed)
Appointments made and avs printed for patient,  The lab schedule is not open so a note was placed on heather appt to add later    Vanessa [name]

## 2014-04-18 NOTE — Progress Notes (Signed)
[place]  Telephone:(336) [phone] Fax:(336) [phone]     ID: Vanessa [name] OB: November 17, 1960  MR#: [id number]  HEN#:[id number]  PCP: Vena Austria, MD GYN:   SU: Autumn Messing, MD [place]:  Thea Silversmith, MD OTHER MD: Donald Prose, MD;  Lorne Skeens, MD;  Pearson Grippe, MD  CHIEF COMPLAINT:  Hx left Breast Cancer  HISTORY OF PRESENT ILLNESS: From Dr. Virgie Dad original office note, dated 05/02/2009:  Vanessa [name] (pronounced March-e-soda) had routine mammography 01/17/2009 which suggested a possible mass in the left breast.  She was recalled for diagnostic mammography and ultrasonography 02/13/2009.This showed very dense breasts but spot compression views confirmed a mass-like density in the upper central portion of the left breast, measuring up to 2.1 cm.  This was not palpable by Dr. Owens Shark.  An ultrasound showed only normal appearing fibroglandular tissue.    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  The tumor was strongly estrogen receptor positive at 98%, progesterone receptor positive at 99%, with a borderline high proliferation fraction at 23%, and Her-2 negative by CISH with a ratio of 1.36.    With this information, the patient was referred to Dr. Marlou Starks, and bilateral breast MRIs, and she was set up for breast-specific gamma imaging at [facility] (the patient was not able to have an MRI because of prior surgeries) on February 23, 2009.  This showed intense focal increased activity at 11 o'clock in the left breast, corresponding with the prior biopsy site.  There was also patchy increased activity through both breasts, most prominently in the upper outer quadrant bilaterally.  Repeat ultrasound of both upper outer quadrants and biopsies were suggested.  These were performed on February 7th at [facility] and Dr. Miquel Dunn  found multiple hypoechoic nodularities bilaterally.  In particular, in the upper outer quadrant of the right breast, there was a hypoechoic nodule at 11 o'clock and in the left breast, a similar nodule at 3 o'clock.  These two areas were biopsied ([id number]) and showed only fibrocytic changes.    With this information, the patient proceeded to definitive local treatment March 7th under Dr. 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  No evidence of lymphovascular invasion and 0 of 6 lymph nodes involved.  (These 6 lymph nodes were hot and had blue dye per Dr. Ethlyn Gallery description).  The patient accordingly stages as a T1b N0 MX.    Subsequent treatment history is as follows.   INTERVAL HISTORY: Vanessa [name] returns today for follow-up of her early stage estrogen receptor positive breast cancer. She continues on tamoxifen, which she is obtaining at a very good price. She doesn't have problems with hot flashes, vaginal discharge, or any other symptoms from the drug that she is aware of. She has just about completed 5 [name]. She is interested in considering another 5 [name].  REVIEW OF SYSTEMS: Vanessa [name] and gone off all her anti-hyperglycemic medications. She is still exercising extra, especially going upstairs. Sometimes she feels a little dizzy but she makes sure to hold on when she is doing that. She continues on her medications to prevent transplant rejection and is tolerating them well. A detailed review of systems today was otherwise entirely stable   PAST MEDICAL  HISTORY: Past Medical History  Diagnosis Date  . History of left breast cancer 03/16/2013  . Anemia in chronic kidney disease(285.21) 03/16/2013  . H/O kidney transplant 03/16/2013  . Brain aneurysm 03/16/2013    Age 54 (1983)  . Hx of primary hypertension 03/16/2013   . Hx gestational diabetes 03/16/2013    PAST SURGICAL HISTORY: No past surgical history on file.  FAMILY HISTORY:  (Updated February 2015) No family history on file. The patient's father is alive at age 65.  He has diabetes and Parkinson's disease. The patient's mother is 76.  She apparently had a glioblastoma removed and treated at [facility] in September of 2009.  This recurred in February 2014, but she is currenty doing well.  Again, her mother was the donor for Vanessa [name]'s kidney.  The patient has her twin sister who is doing very well and a younger sister.  There is no history of breast or ovarian cancer in the family.    GYNECOLOGIC HISTORY: (Updated February 2015)  She is GX P2, first pregnancy to term at age 50.   Her periods are irregular, LMP March 2014.   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 Her husband of 30+ [name], Vanessa [name] (goes by Vanessa [name]) is self-employed in maintenance although currently unemployed.  Their 56 year old [name], Vanessa [name], works full-time and is also in school.  Daughter Vanessa [name], 17, goes to [employer].  The patient attends a friend's church.    ADVANCED DIRECTIVES: Not in place; at her 04/18/2014 visit the patient was given the appropriate documents 2 complete and notarize at her discretion   HEALTH MAINTENANCE:  (Updated February 2015) History  Substance Use Topics  . Smoking status: Former Smoker -- 0.50 packs/day for 2 [name]    Types: Cigarettes  . Smokeless tobacco: Never Used  . Alcohol Use: No     Colonoscopy:  2005/Dr. Wynetta Emery (was due again in 2010)  PAP:  Sept 2012/Dr. Sun  Bone density:  May 2011, Normal   Lipid panel:  [facility], Dr. Alyson Ingles   No Known Allergies  Current Outpatient Prescriptions  Medication Sig Dispense Refill  . amoxicillin (AMOXIL) 500 MG capsule 2,000 mg. Take 4 tablets before dental procedures    . colchicine 0.6 MG tablet Take 2 now and 1 in 1 hour.  May repeat  dose once daily.  For gout attack. 12 tablet 0  . furosemide (LASIX) 20 MG tablet Take 20 mg by mouth daily. Take 1/2 tablet daily.    . mycophenolate (CELLCEPT) 500 MG tablet     . predniSONE (DELTASONE) 5 MG tablet Take 1 tablet (5 mg total) by mouth daily with breakfast. 90 tablet 4  . SYNTHROID 137 MCG tablet     . tacrolimus (PROGRAF) 1 MG capsule     . tamoxifen (NOLVADEX) 20 MG tablet TAKE 1 TABLET BY MOUTH EVERY DAY 30 tablet 5  . ULORIC 40 MG tablet      No current facility-administered medications for this visit.    OBJECTIVE:  Middle-aged white woman in no acute distress Filed Vitals:   04/18/14 0915  BP: 149/86  Pulse: 77  Temp: 98.3 F (36.8 C)  Resp: 18     Body mass index is 20.53 kg/(m^2).    ECOG FS: 0 Filed Weights   04/18/14 0915  Weight: 112 lb 4.8 oz (50.939 kg)   Indentation left frontal skull as previously noted Sclerae unicteric, pupils round and equal Oropharynx clear and moist--  no thrush or other lesions No cervical or supraclavicular adenopathy Lungs no rales or rhonchi Heart regular rate and rhythm Abd soft, nontender, positive bowel sounds MSK no focal spinal tenderness, no upper extremity lymphedema Neuro: nonfocal, well oriented, appropriate affect Breasts: The right breast is unremarkable. Left breast is status post lumpectomy and radiation. There is no evidence of local recurrence. The left axilla is benign.   LAB RESULTS:    Lab Results  Component Value Date   WBC 6.1 04/18/2014   NEUTROABS 5.0 04/18/2014   HGB 10.4* 04/18/2014   HCT 32.8* 04/18/2014   MCV 88.6 04/18/2014   PLT 178 04/18/2014      Chemistry      Component Value Date/Time   NA 138 04/18/2014 0907   NA 139 04/02/2009 0924   K 4.0 04/18/2014 0907   K 3.9 04/02/2009 0924   CL 111 04/02/2009 0924   CO2 21* 04/18/2014 0907   CO2 23 04/02/2009 0924   BUN 12.2 04/18/2014 0907   BUN 30* 04/02/2009 0924   CREATININE 0.8 04/18/2014 0907   CREATININE 1.51*  04/02/2009 0924      Component Value Date/Time   CALCIUM 8.1* 04/18/2014 0907   CALCIUM 8.4 04/02/2009 0924   ALKPHOS 128 04/18/2014 0907   ALKPHOS 56 04/02/2009 0924   AST 15 04/18/2014 0907   AST 18 04/02/2009 0924   ALT 18 04/18/2014 0907   ALT 22 04/02/2009 0924   BILITOT 0.35 04/18/2014 0907   BILITOT 0.3 04/02/2009 0924      STUDIES: Mammography 06/03/2013 was unremarkable  ASSESSMENT: 54 y.o. Milus Glazier woman: 1. Remote History of Renal transplant, with persistent anemia followed by her nephrologist, Dr. Joelyn Oms. Receives Aranesp injections as needed. 2. Status post left lumpectomy and sentinel lymph node sampling March 2011 for a T1b N0 grade 1 invasive ductal carcinoma, with an MIB - 1 of 23%.  The tumor was strongly estrogen and progesterone receptor positive, HER-2 negative.   3. She completed radiation in June 2011, at which point she started tamoxifen.      PLAN: Keili is doing remarkably well overall, we discussed the possibility of "graduating" today. However she is interested in continuing tamoxifen, particularly since she is tolerating it with no side effects and obtaining it at no significant cost.  We reviewed the data that shows that 10 [name] of tamoxifen is superior to 5 [name] of tamoxifen, at least in patients who have larger tumors or positive lymph nodes.  In all I am very comfortable with her remaining on tamoxifen. We are going to move her appointments to May so we can see her after her mammography in the future. She will see my 14 assistant next year and then see me the following year. We will keep "tag team in her" until she completes her 10 [name] of follow-up.  Timica has a good understanding of the overall plan. She agrees with it. She knows the role of treatment in her case is cure. She will call with any problems that may develop before her next visit here.  Chauncey Cruel, MD   04/18/2014 9:46 AM

## 2014-05-08 ENCOUNTER — Other Ambulatory Visit: Payer: Self-pay

## 2014-05-08 DIAGNOSIS — Z1231 Encounter for screening mammogram for malignant neoplasm of breast: Secondary | ICD-10-CM

## 2014-06-21 ENCOUNTER — Other Ambulatory Visit: Payer: Self-pay | Admitting: Family Medicine

## 2014-06-21 DIAGNOSIS — Z853 Personal history of malignant neoplasm of breast: Secondary | ICD-10-CM

## 2014-07-18 ENCOUNTER — Ambulatory Visit
Admission: RE | Admit: 2014-07-18 | Discharge: 2014-07-18 | Disposition: A | Discharge status: Home / Self Care | Payer: BLUE CROSS/BLUE SHIELD | Source: Ambulatory Visit | Attending: Family Medicine | Admitting: Family Medicine

## 2014-07-18 DIAGNOSIS — Z853 Personal history of malignant neoplasm of breast: Secondary | ICD-10-CM

## 2014-10-08 ENCOUNTER — Other Ambulatory Visit: Payer: Self-pay | Admitting: Oncology

## 2015-01-06 ENCOUNTER — Other Ambulatory Visit: Payer: Self-pay | Admitting: Oncology

## 2015-04-21 ENCOUNTER — Emergency Department (HOSPITAL_COMMUNITY)
Admission: EM | Admit: 2015-04-21 | Discharge: 2015-04-21 | Disposition: A | Discharge status: Nursing Home (Includes ICF) | Payer: BLUE CROSS/BLUE SHIELD | Source: Home / Self Care | Attending: Family Medicine | Admitting: Family Medicine

## 2015-04-21 ENCOUNTER — Emergency Department (INDEPENDENT_AMBULATORY_CARE_PROVIDER_SITE_OTHER): Payer: BLUE CROSS/BLUE SHIELD

## 2015-04-21 ENCOUNTER — Encounter (HOSPITAL_COMMUNITY): Payer: Self-pay | Admitting: *Deleted

## 2015-04-21 ENCOUNTER — Inpatient Hospital Stay (HOSPITAL_COMMUNITY)
Admission: EM | Admit: 2015-04-21 | Discharge: 2015-04-23 | DRG: 603 | Disposition: A | Discharge status: Home / Self Care | Payer: BLUE CROSS/BLUE SHIELD | Attending: Internal Medicine | Admitting: Internal Medicine

## 2015-04-21 ENCOUNTER — Encounter (HOSPITAL_COMMUNITY): Payer: Self-pay | Admitting: Nurse Practitioner

## 2015-04-21 ENCOUNTER — Inpatient Hospital Stay (HOSPITAL_COMMUNITY): Payer: BLUE CROSS/BLUE SHIELD

## 2015-04-21 DIAGNOSIS — I10 Essential (primary) hypertension: Secondary | ICD-10-CM | POA: Diagnosis present

## 2015-04-21 DIAGNOSIS — Z8249 Family history of ischemic heart disease and other diseases of the circulatory system: Secondary | ICD-10-CM | POA: Diagnosis not present

## 2015-04-21 DIAGNOSIS — C50912 Malignant neoplasm of unspecified site of left female breast: Secondary | ICD-10-CM | POA: Diagnosis present

## 2015-04-21 DIAGNOSIS — Z87891 Personal history of nicotine dependence: Secondary | ICD-10-CM | POA: Diagnosis not present

## 2015-04-21 DIAGNOSIS — D72819 Decreased white blood cell count, unspecified: Secondary | ICD-10-CM | POA: Diagnosis not present

## 2015-04-21 DIAGNOSIS — M7989 Other specified soft tissue disorders: Secondary | ICD-10-CM | POA: Diagnosis present

## 2015-04-21 DIAGNOSIS — N189 Chronic kidney disease, unspecified: Secondary | ICD-10-CM | POA: Diagnosis not present

## 2015-04-21 DIAGNOSIS — D631 Anemia in chronic kidney disease: Secondary | ICD-10-CM | POA: Diagnosis present

## 2015-04-21 DIAGNOSIS — E871 Hypo-osmolality and hyponatremia: Secondary | ICD-10-CM | POA: Diagnosis present

## 2015-04-21 DIAGNOSIS — L0390 Cellulitis, unspecified: Secondary | ICD-10-CM | POA: Diagnosis present

## 2015-04-21 DIAGNOSIS — Z820 Family history of epilepsy and other diseases of the nervous system: Secondary | ICD-10-CM | POA: Diagnosis not present

## 2015-04-21 DIAGNOSIS — E039 Hypothyroidism, unspecified: Secondary | ICD-10-CM | POA: Diagnosis present

## 2015-04-21 DIAGNOSIS — Z833 Family history of diabetes mellitus: Secondary | ICD-10-CM | POA: Diagnosis not present

## 2015-04-21 DIAGNOSIS — Z808 Family history of malignant neoplasm of other organs or systems: Secondary | ICD-10-CM

## 2015-04-21 DIAGNOSIS — Z79810 Long term (current) use of selective estrogen receptor modulators (SERMs): Secondary | ICD-10-CM | POA: Diagnosis not present

## 2015-04-21 DIAGNOSIS — T148 Other injury of unspecified body region: Secondary | ICD-10-CM

## 2015-04-21 DIAGNOSIS — Z7952 Long term (current) use of systemic steroids: Secondary | ICD-10-CM

## 2015-04-21 DIAGNOSIS — T148XXA Other injury of unspecified body region, initial encounter: Secondary | ICD-10-CM

## 2015-04-21 DIAGNOSIS — Z940 Kidney transplant status: Secondary | ICD-10-CM | POA: Diagnosis not present

## 2015-04-21 DIAGNOSIS — L03116 Cellulitis of left lower limb: Secondary | ICD-10-CM

## 2015-04-21 DIAGNOSIS — D849 Immunodeficiency, unspecified: Secondary | ICD-10-CM

## 2015-04-21 DIAGNOSIS — Z79899 Other long term (current) drug therapy: Secondary | ICD-10-CM | POA: Diagnosis not present

## 2015-04-21 DIAGNOSIS — D899 Disorder involving the immune mechanism, unspecified: Secondary | ICD-10-CM

## 2015-04-21 DIAGNOSIS — L089 Local infection of the skin and subcutaneous tissue, unspecified: Secondary | ICD-10-CM

## 2015-04-21 LAB — CBC WITH DIFFERENTIAL/PLATELET
Basophils Absolute: 0 10*3/uL (ref 0.0–0.1)
Basophils Relative: 0 %
EOS PCT: 0 %
Eosinophils Absolute: 0 10*3/uL (ref 0.0–0.7)
HCT: 31.8 % — ABNORMAL LOW (ref 36.0–46.0)
Hemoglobin: 10.1 g/dL — ABNORMAL LOW (ref 12.0–15.0)
LYMPHS PCT: 7 %
Lymphs Abs: 0.4 10*3/uL — ABNORMAL LOW (ref 0.7–4.0)
MCH: 28.5 pg (ref 26.0–34.0)
MCHC: 31.8 g/dL (ref 30.0–36.0)
MCV: 89.6 fL (ref 78.0–100.0)
MONOS PCT: 6 %
Monocytes Absolute: 0.3 10*3/uL (ref 0.1–1.0)
Neutro Abs: 5 10*3/uL (ref 1.7–7.7)
Neutrophils Relative %: 87 %
Platelets: 211 10*3/uL (ref 150–400)
RBC: 3.55 MIL/uL — AB (ref 3.87–5.11)
RDW: 13.8 % (ref 11.5–15.5)
WBC: 5.7 10*3/uL (ref 4.0–10.5)

## 2015-04-21 LAB — BASIC METABOLIC PANEL
Anion gap: 9 (ref 5–15)
BUN: 14 mg/dL (ref 6–20)
CALCIUM: 8.1 mg/dL — AB (ref 8.9–10.3)
CHLORIDE: 101 mmol/L (ref 101–111)
CO2: 19 mmol/L — ABNORMAL LOW (ref 22–32)
Creatinine, Ser: 0.96 mg/dL (ref 0.44–1.00)
GFR calc Af Amer: >60 mL/min (ref >60)
GFR calc non Af Amer: >60 mL/min (ref >60)
Glucose, Bld: 122 mg/dL — ABNORMAL HIGH (ref 65–99)
POTASSIUM: 4.3 mmol/L (ref 3.5–5.1)
Sodium: 129 mmol/L — ABNORMAL LOW (ref 135–145)

## 2015-04-21 LAB — URINALYSIS, ROUTINE W REFLEX MICROSCOPIC
BILIRUBIN URINE: NEGATIVE
Glucose, UA: NEGATIVE mg/dL
HGB URINE DIPSTICK: NEGATIVE
KETONES UR: NEGATIVE mg/dL
Leukocytes, UA: NEGATIVE
Nitrite: NEGATIVE
PROTEIN: NEGATIVE mg/dL
Specific Gravity, Urine: 1.011 (ref 1.005–1.030)
pH: 7 (ref 5.0–8.0)

## 2015-04-21 LAB — CREATININE, URINE, RANDOM: Creatinine, Urine: 47.95 mg/dL

## 2015-04-21 LAB — I-STAT CG4 LACTIC ACID, ED
LACTIC ACID, VENOUS: 0.57 mmol/L (ref 0.5–2.0)
Lactic Acid, Venous: 0.87 mmol/L (ref 0.5–2.0)

## 2015-04-21 LAB — SODIUM, URINE, RANDOM: SODIUM UR: 72 mmol/L

## 2015-04-21 LAB — OSMOLALITY, URINE: OSMOLALITY UR: 340 mosm/kg (ref 300–900)

## 2015-04-21 MED ORDER — ACETAMINOPHEN 325 MG PO TABS: 650.0000 mg | ORAL_TABLET | Freq: Four times a day (QID) | ORAL | Status: DC | PRN

## 2015-04-21 MED ORDER — ONDANSETRON HCL 4 MG PO TABS: 4.0000 mg | ORAL_TABLET | Freq: Four times a day (QID) | ORAL | Status: DC | PRN

## 2015-04-21 MED ORDER — MYCOPHENOLATE MOFETIL 250 MG PO CAPS
1000.0000 mg | ORAL_CAPSULE | Freq: Every day | ORAL | Status: DC
  Administered 2015-04-22 – 2015-04-23 (×2): 1000 mg via ORAL
  Filled 2015-04-21 (×4): qty 4

## 2015-04-21 MED ORDER — ENOXAPARIN SODIUM 40 MG/0.4ML ~~LOC~~ SOLN
40.0000 mg | SUBCUTANEOUS | Status: DC
  Administered 2015-04-21 – 2015-04-22 (×2): 40 mg via SUBCUTANEOUS
  Filled 2015-04-21 (×2): qty 0.4

## 2015-04-21 MED ORDER — MYCOPHENOLATE MOFETIL 250 MG PO CAPS
500.0000 mg | ORAL_CAPSULE | Freq: Every day | ORAL | Status: DC
  Administered 2015-04-21 – 2015-04-22 (×2): 500 mg via ORAL
  Filled 2015-04-21 (×2): qty 2

## 2015-04-21 MED ORDER — ONDANSETRON HCL 4 MG/2ML IJ SOLN: 4.0000 mg | Freq: Four times a day (QID) | INTRAMUSCULAR | Status: DC | PRN

## 2015-04-21 MED ORDER — FEBUXOSTAT 40 MG PO TABS
40.0000 mg | ORAL_TABLET | Freq: Every day | ORAL | Status: DC
  Administered 2015-04-22 – 2015-04-23 (×2): 40 mg via ORAL
  Filled 2015-04-21 (×4): qty 1

## 2015-04-21 MED ORDER — TETANUS-DIPHTH-ACELL PERTUSSIS 5-2.5-18.5 LF-MCG/0.5 IM SUSP
0.5000 mL | Freq: Once | INTRAMUSCULAR | Status: AC
  Administered 2015-04-21: 0.5 mL via INTRAMUSCULAR

## 2015-04-21 MED ORDER — SODIUM CHLORIDE 0.9 % IV SOLN
INTRAVENOUS | Status: AC
  Administered 2015-04-21: 22:00:00 via INTRAVENOUS

## 2015-04-21 MED ORDER — TETANUS-DIPHTH-ACELL PERTUSSIS 5-2.5-18.5 LF-MCG/0.5 IM SUSP
INTRAMUSCULAR | Status: AC
  Filled 2015-04-21: qty 0.5

## 2015-04-21 MED ORDER — ACETAMINOPHEN 650 MG RE SUPP: 650.0000 mg | Freq: Four times a day (QID) | RECTAL | Status: DC | PRN

## 2015-04-21 MED ORDER — VANCOMYCIN HCL 500 MG IV SOLR
500.0000 mg | Freq: Two times a day (BID) | INTRAVENOUS | Status: DC
  Administered 2015-04-22 – 2015-04-23 (×3): 500 mg via INTRAVENOUS
  Filled 2015-04-21 (×5): qty 500

## 2015-04-21 MED ORDER — TACROLIMUS 1 MG PO CAPS
1.0000 mg | ORAL_CAPSULE | Freq: Two times a day (BID) | ORAL | Status: DC
  Administered 2015-04-21 – 2015-04-23 (×4): 1 mg via ORAL
  Filled 2015-04-21 (×6): qty 1

## 2015-04-21 MED ORDER — BACITRACIN ZINC 500 UNIT/GM EX OINT
TOPICAL_OINTMENT | Freq: Two times a day (BID) | CUTANEOUS | Status: DC
  Administered 2015-04-21 – 2015-04-22 (×3): 31.5556 via TOPICAL
  Administered 2015-04-23: 10:00:00 via TOPICAL
  Filled 2015-04-21: qty 28.35

## 2015-04-21 MED ORDER — CEFAZOLIN SODIUM 1-5 GM-% IV SOLN
1.0000 g | Freq: Once | INTRAVENOUS | Status: AC
  Administered 2015-04-21: 1 g via INTRAVENOUS
  Filled 2015-04-21: qty 50

## 2015-04-21 MED ORDER — HYDROCODONE-ACETAMINOPHEN 5-325 MG PO TABS: 1.0000 | ORAL_TABLET | ORAL | Status: DC | PRN

## 2015-04-21 MED ORDER — PREDNISONE 5 MG PO TABS
5.0000 mg | ORAL_TABLET | Freq: Every day | ORAL | Status: DC
  Administered 2015-04-22 – 2015-04-23 (×2): 5 mg via ORAL
  Filled 2015-04-21 (×2): qty 1

## 2015-04-21 MED ORDER — LEVOTHYROXINE SODIUM 25 MCG PO TABS
137.0000 ug | ORAL_TABLET | Freq: Every day | ORAL | Status: DC
  Administered 2015-04-22 – 2015-04-23 (×2): 137 ug via ORAL
  Filled 2015-04-21 (×2): qty 1

## 2015-04-21 MED ORDER — VANCOMYCIN HCL IN DEXTROSE 1-5 GM/200ML-% IV SOLN
1000.0000 mg | Freq: Once | INTRAVENOUS | Status: AC
  Administered 2015-04-21: 1000 mg via INTRAVENOUS
  Filled 2015-04-21: qty 200

## 2015-04-21 NOTE — ED Provider Notes (Signed)
CSN: [id number]     Arrival date & time 04/21/15  1428 History   First MD Initiated Contact with Patient 04/21/15 1616     Chief Complaint  Patient presents with  . Cellulitis     (Consider location/radiation/quality/duration/timing/severity/associated sxs/prior Treatment) The history is provided by the patient and medical records. No language interpreter was used.     Vanessa [name] is a 55 y.o. female  with a hx of left breast cancer (on tamoxifen), anemia, kidney transplant (on cellcept, prednisone and prograf), HTN presents to the Emergency Department complaining of gradual, persistent, progressively worsening swelling and redness to the left anterior lower extremity onset 2 days ago after a puncture wound resulting from a fall approximately 1 week ago. Patient reports that the leg was sore but appeared normal until yesterday when swelling and erythema began. She reports pain in the site and drainage of a large amount of pus while she was at the urgent care earlier today. She denies fever, chills, nausea, vomiting.  Patient denies a previous hospitalizations for infections.  Associated abrasion to the right elbow which is healing without signs of infection. Patient has used absent salt and Neosporin without relief. Nothing makes it better or worse.  Past Medical History  Diagnosis Date  . History of left breast cancer 03/16/2013  . Anemia in chronic kidney disease(285.21) 03/16/2013  . H/O kidney transplant 03/16/2013  . Brain aneurysm 03/16/2013    Age 88 (1983)  . Hx of primary hypertension 03/16/2013  . Hx gestational diabetes 03/16/2013   History reviewed. No pertinent past surgical history. Family History  Problem Relation Age of Onset  . Brain cancer Mother   . Diabetes type II Father   . Parkinson's disease Father    Social History  Substance Use Topics  . Smoking status: Former Smoker -- 0.50 packs/day for 2 years    Types: Cigarettes  . Smokeless tobacco: Never Used    . Alcohol Use: No   OB History    No data available     Review of Systems  Constitutional: Negative for fever, diaphoresis, appetite change, fatigue and unexpected weight change.  HENT: Negative for mouth sores.   Eyes: Negative for visual disturbance.  Respiratory: Negative for cough, chest tightness, shortness of breath and wheezing.   Cardiovascular: Negative for chest pain.  Gastrointestinal: Negative for nausea, vomiting, abdominal pain, diarrhea and constipation.  Endocrine: Negative for polydipsia, polyphagia and polyuria.  Genitourinary: Negative for dysuria, urgency, frequency and hematuria.  Musculoskeletal: Negative for back pain and neck stiffness.  Skin: Positive for color change and wound. Negative for rash.  Allergic/Immunologic: Negative for immunocompromised state.  Neurological: Negative for syncope, light-headedness and headaches.  Hematological: Does not bruise/bleed easily.  Psychiatric/Behavioral: Negative for sleep disturbance. The patient is not nervous/anxious.       Allergies  Review of patient's allergies indicates no known allergies.  Home Medications   Prior to Admission medications   Medication Sig Start Date End Date Taking? Authorizing Provider  acetaminophen (TYLENOL) 500 MG tablet Take 1,000 mg by mouth every 6 (six) hours as needed for moderate pain.   Yes Historical Provider, MD  amoxicillin (AMOXIL) 500 MG capsule Take 2,000 mg by mouth daily as needed (for dental procedures). Take 4 tablets before dental procedures 03/03/13  Yes Historical Provider, MD  colchicine 0.6 MG tablet Take 2 now and 1 in 1 hour.  May repeat dose once daily.  For gout attack. Patient taking differently: Take 0.6 mg by mouth  daily as needed (for flareup). Take 2 now and 1 in 1 hour.  May repeat dose once daily.  For gout attack. 02/08/12  Yes Harden Mo, MD  [facility] VITAMIN B12 1000 MCG tablet Take 2,000 mcg by mouth daily. 04/03/15  Yes Historical Provider, MD   furosemide (LASIX) 20 MG tablet Take 20 mg by mouth daily as needed for fluid or edema. Take 1/2 tablet daily.   Yes Historical Provider, MD  mycophenolate (CELLCEPT) 500 MG tablet Take 500-1,000 mg by mouth 2 (two) times daily. Takes 2 tabs in am and 1 tab in pm 02/21/13  Yes Historical Provider, MD  predniSONE (DELTASONE) 5 MG tablet Take 1 tablet (5 mg total) by mouth daily with breakfast. 04/18/14  Yes Chauncey Cruel, MD  SYNTHROID 137 MCG tablet Take 137 mcg by mouth daily before breakfast.  01/25/13  Yes Historical Provider, MD  tacrolimus (PROGRAF) 1 MG capsule Take 1 mg by mouth 2 (two) times daily.  02/27/13  Yes Historical Provider, MD  tamoxifen (NOLVADEX) 20 MG tablet TAKE 1 TABLET BY MOUTH EVERY DAY 10/09/14  Yes Chauncey Cruel, MD  ULORIC 40 MG tablet Take 40 mg by mouth daily.  02/18/13  Yes Historical Provider, MD  Vitamin D, Ergocalciferol, (DRISDOL) 50000 units CAPS capsule Take 1 capsule by mouth every Sunday. 03/29/15  Yes Historical Provider, MD  tamoxifen (NOLVADEX) 20 MG tablet TAKE 1 TABLET BY MOUTH EVERY DAY 01/08/15   Chauncey Cruel, MD   BP 121/82 mmHg  Pulse 73  Temp(Src) 98.4 F (36.9 C) (Oral)  Resp 18  Wt 52.663 kg  SpO2 100%  LMP 01/16/2012 Physical Exam  Constitutional: She appears well-developed and well-nourished. No distress.  Awake, alert, nontoxic appearance  HENT:  Head: Normocephalic and atraumatic.  Mouth/Throat: Oropharynx is clear and moist. No oropharyngeal exudate.  Eyes: Conjunctivae are normal. No scleral icterus.  Neck: Normal range of motion. Neck supple.  Cardiovascular: Normal rate, regular rhythm and intact distal pulses.   Pulmonary/Chest: Effort normal and breath sounds normal. No respiratory distress. She has no wheezes.  Equal chest expansion  Abdominal: Soft. Bowel sounds are normal. She exhibits no mass. There is no tenderness. There is no rebound and no guarding.  Musculoskeletal: Normal range of motion. She exhibits edema (  1+, bilateral lower extremities).  Full range of motion of the left knee and ankle  Neurological: She is alert.  Speech is clear and goal oriented Moves extremities without ataxia  Skin: Skin is warm and dry. She is not diaphoretic. There is erythema.  Large area of erythema, induration and increased warmth to the anterior left lower leg extending from the ankle to just distal to the left knee; large open wound in the center of this  Psychiatric: She has a normal mood and affect.  Nursing note and vitals reviewed.     ED Course  Procedures (including critical care time) Labs Review Labs Reviewed  CBC WITH DIFFERENTIAL/PLATELET - Abnormal; Notable for the following:    RBC 3.55 (*)    Hemoglobin 10.1 (*)    HCT 31.8 (*)    Lymphs Abs 0.4 (*)    All other components within normal limits  BASIC METABOLIC PANEL - Abnormal; Notable for the following:    Sodium 129 (*)    CO2 19 (*)    Glucose, Bld 122 (*)    Calcium 8.1 (*)    All other components within normal limits  I-STAT CG4 LACTIC ACID, ED  Imaging Review Dg Tibia/fibula Left  04/21/2015  CLINICAL DATA:  Pt fell against the door frame x 1 week ago, pt has a wound at the mid tib-fib area, wound has a scab on it and the Dr did a wound culture, pt states it had white puss in it, leg is red and swollen and tender to touch EXAM: LEFT TIBIA AND FIBULA - 2 VIEW COMPARISON:  None. FINDINGS: There is skin defect along the anterior margin of the mid lower extremity pitting 8 x 4 mm. No foreign body evident. No osseous abnormality. IMPRESSION: Soft tissue ulceration.  No osseous abnormality. Electronically Signed   By: Suzy Bouchard M.D.   On: 04/21/2015 14:07   I have personally reviewed and evaluated these images and lab results as part of my medical decision-making.    MDM   Final diagnoses:  Cellulitis of left lower extremity  Immunosuppressed status ([place])  H/O kidney transplant   Saren S Findling presents with  worsening cellulitis of the left lower extremity.  No evidence of SIRS or sepsis.  No evidence of AKI.  Pt will need admission for IV abx.  Normal lactic acid.    6:35PM Discussed with Dr. Roel Cluck who will admit to med-surg.    6:58 PM Discussed with Dr. Jonnie Finner who recommends that she continue her cellcept, prograf and prednisone.    Jarrett Soho Livi Mcgann, PA-C 04/21/15 [place], MD 04/21/15 [phone]

## 2015-04-21 NOTE — H&P (Addendum)
PCP: Vena Austria, MD  Oncology Magrinat SU: Autumn Messing, MD [place]: Thea Silversmith, MD Nephrology Dr. Joelyn Oms   Referring provider Jarrett Soho PA   Chief Complaint:  Leg swelling and pain  HPI: Vanessa [name] is a 55 y.o. female   has a past medical history of History of left breast cancer (03/16/2013); Anemia in chronic kidney disease(285.21) (03/16/2013); H/O kidney transplant (03/16/2013); Brain aneurysm (03/16/2013); primary hypertension (03/16/2013); and gestational diabetes (03/16/2013).   Presented with 1 week ago she was caring a heavy box and fell she noted a small puncture wound on her left leg she has been using epson salts and salt at first she did well but few days later noted some pus. She went to urgent care and MD have squeezed the wound extracting some purulent material. She was given a tetanus shot . She reports Pain   irritation with erythema associated induration and warmth.   IN ER: Na 129 bicarb 19, Cr 0.96, WBC 5.7 hemoglobin 10.1, Lactic acid 0.87 Patient afebrile no evidence of sepsis Started on Ancef EF provided discussed case with nephrology Dr. Tessa Lerner who recommended continuation of immunosuppressive medications at this point and following creatinine  Regarding pertinent past history: Breast carcinoma diagnosed since 2010 which was localized with no lymphovascular invasion she is currently on tamoxifen been on this for the past 5 years. She has lost over 80 pounds and has not required any more antihypertensive medications and now off diabetes medications as well for the past 3 years.   In 1975 patient donated one of her kidneys to her twin sister but years later due to diabetes and hypertension her one remaining kidney failed and patient has undergone kidney transplant in 2003 from live donor here mother. Currently on CellCept and Prograf prednisone she has history of anemia of chronic renal disease on Aranesp injections as needed. A hemoglobin at  baseline between 9 and 10  Hospitalist was called for admission for lower extremity cellulitis  Review of Systems:    Pertinent positives include: leg swelling and erythema  Constitutional:  No weight loss, night sweats, Fevers, chills, fatigue, weight loss  HEENT:  No headaches, Difficulty swallowing,Tooth/dental problems,Sore throat,  No sneezing, itching, ear ache, nasal congestion, post nasal drip,  Cardio-vascular:  No chest pain, Orthopnea, PND, anasarca, dizziness, palpitations.no Bilateral lower extremity swelling  GI:  No heartburn, indigestion, abdominal pain, nausea, vomiting, diarrhea, change in bowel habits, loss of appetite, melena, blood in stool, hematemesis Resp:  no shortness of breath at rest. No dyspnea on exertion, No excess mucus, no productive cough, No non-productive cough, No coughing up of blood.No change in color of mucus.No wheezing. Skin:  no rash or lesions. No jaundice GU:  no dysuria, change in color of urine, no urgency or frequency. No straining to urinate.  No flank pain.  Musculoskeletal:  No joint pain or no joint swelling. No decreased range of motion. No back pain.  Psych:  No change in mood or affect. No depression or anxiety. No memory loss.  Neuro: no localizing neurological complaints, no tingling, no weakness, no double vision, no gait abnormality, no slurred speech, no confusion  Otherwise ROS are negative except for above, 10 systems were reviewed  Past Medical History: Past Medical History  Diagnosis Date  . History of left breast cancer 03/16/2013  . Anemia in chronic kidney disease(285.21) 03/16/2013  . H/O kidney transplant 03/16/2013  . Brain aneurysm 03/16/2013    Age 35 (1983)  . Hx of primary hypertension  03/16/2013  . Hx gestational diabetes 03/16/2013   History reviewed. No pertinent past surgical history.   Medications: Prior to Admission medications   Medication Sig Start Date End Date Taking? Authorizing Provider    acetaminophen (TYLENOL) 500 MG tablet Take 1,000 mg by mouth every 6 (six) hours as needed for moderate pain.   Yes Historical Provider, MD  amoxicillin (AMOXIL) 500 MG capsule Take 2,000 mg by mouth daily as needed (for dental procedures). Take 4 tablets before dental procedures 03/03/13  Yes Historical Provider, MD  colchicine 0.6 MG tablet Take 2 now and 1 in 1 hour.  May repeat dose once daily.  For gout attack. Patient taking differently: Take 0.6 mg by mouth daily as needed (for flareup). Take 2 now and 1 in 1 hour.  May repeat dose once daily.  For gout attack. 02/08/12  Yes Harden Mo, MD  [facility] VITAMIN B12 1000 MCG tablet Take 2,000 mcg by mouth daily. 04/03/15  Yes Historical Provider, MD  furosemide (LASIX) 20 MG tablet Take 20 mg by mouth daily as needed for fluid or edema. Take 1/2 tablet daily.   Yes Historical Provider, MD  mycophenolate (CELLCEPT) 500 MG tablet Take 500-1,000 mg by mouth 2 (two) times daily. Takes 2 tabs in am and 1 tab in pm 02/21/13  Yes Historical Provider, MD  predniSONE (DELTASONE) 5 MG tablet Take 1 tablet (5 mg total) by mouth daily with breakfast. 04/18/14  Yes Chauncey Cruel, MD  SYNTHROID 137 MCG tablet Take 137 mcg by mouth daily before breakfast.  01/25/13  Yes Historical Provider, MD  tacrolimus (PROGRAF) 1 MG capsule Take 1 mg by mouth 2 (two) times daily.  02/27/13  Yes Historical Provider, MD  tamoxifen (NOLVADEX) 20 MG tablet TAKE 1 TABLET BY MOUTH EVERY DAY 10/09/14  Yes Chauncey Cruel, MD  ULORIC 40 MG tablet Take 40 mg by mouth daily.  02/18/13  Yes Historical Provider, MD  Vitamin D, Ergocalciferol, (DRISDOL) 50000 units CAPS capsule Take 1 capsule by mouth every Sunday. 03/29/15  Yes Historical Provider, MD  tamoxifen (NOLVADEX) 20 MG tablet TAKE 1 TABLET BY MOUTH EVERY DAY 01/08/15   Chauncey Cruel, MD    Allergies:  No Known Allergies  Social History:  Ambulatory   independently   Lives at home With family husband of 79 years      reports that she has quit smoking. Her smoking use included Cigarettes. She has a 1 pack-year smoking history. She has never used smokeless tobacco. She reports that she does not drink alcohol or use illicit drugs.     Family History: family history includes Brain cancer in her mother; Diabetes type II in her father; Parkinson's disease in her father.    Physical Exam: Patient Vitals for the past 24 hrs:  BP Temp Temp src Pulse Resp SpO2 Weight  04/21/15 1745 121/82 mmHg - - 73 - 100 % -  04/21/15 1715 149/86 mmHg - - 77 - 100 % -  04/21/15 1700 128/80 mmHg - - 76 - 99 % -  04/21/15 1645 133/85 mmHg - - 75 - 100 % -  04/21/15 1507 120/71 mmHg 98.4 F (36.9 C) Oral 78 18 98 % 52.663 kg (116 lb 1.6 oz)    1. General:  in No Acute distress 2. Psychological: Alert and   Oriented 3. Head/ENT:   Moist Mucous Membranes  Head Non traumatic, neck supple                          Normal  Dentition 4. SKIN: normal  Skin turgor,  Skin clean Dry erythema and induration over left lower leg 1 cm wound noted with purulent material    Seemed to have improved compared to the prior markings from urgent care  5. Heart: Regular rate and rhythm no  Murmur, Rub or gallop 6. Lungs:  Clear to auscultation bilaterally, no wheezes or crackles   7. Abdomen: Soft, non-tender, Non distended 8. Lower extremities: no clubbing, cyanosis, or edema 9. Neurologically Grossly intact, moving all 4 extremities equally 10. MSK: Normal range of motion  body mass index is 21.23 kg/(m^2).   Labs on Admission:   Results for orders placed or performed during the hospital encounter of 04/21/15 (from the past 24 hour(s))  CBC with Differential     Status: Abnormal   Collection Time: 04/21/15  5:00 PM  Result Value Ref Range   WBC 5.7 4.0 - 10.5 K/uL   RBC 3.55 (L) 3.87 - 5.11 MIL/uL   Hemoglobin 10.1 (L) 12.0 - 15.0 g/dL   HCT 31.8 (L) 36.0 - 46.0 %   MCV 89.6 78.0 - 100.0 fL   MCH 28.5  26.0 - 34.0 pg   MCHC 31.8 30.0 - 36.0 g/dL   RDW 13.8 11.5 - 15.5 %   Platelets 211 150 - 400 K/uL   Neutrophils Relative % 87 %   Neutro Abs 5.0 1.7 - 7.7 K/uL   Lymphocytes Relative 7 %   Lymphs Abs 0.4 (L) 0.7 - 4.0 K/uL   Monocytes Relative 6 %   Monocytes Absolute 0.3 0.1 - 1.0 K/uL   Eosinophils Relative 0 %   Eosinophils Absolute 0.0 0.0 - 0.7 K/uL   Basophils Relative 0 %   Basophils Absolute 0.0 0.0 - 0.1 K/uL  Basic metabolic panel     Status: Abnormal   Collection Time: 04/21/15  5:00 PM  Result Value Ref Range   Sodium 129 (L) 135 - 145 mmol/L   Potassium 4.3 3.5 - 5.1 mmol/L   Chloride 101 101 - 111 mmol/L   CO2 19 (L) 22 - 32 mmol/L   Glucose, Bld 122 (H) 65 - 99 mg/dL   BUN 14 6 - 20 mg/dL   Creatinine, Ser 0.96 0.44 - 1.00 mg/dL   Calcium 8.1 (L) 8.9 - 10.3 mg/dL   GFR calc non Af Amer >60 >60 mL/min   GFR calc Af Amer >60 >60 mL/min   Anion gap 9 5 - 15  I-Stat CG4 Lactic Acid, ED     Status: None   Collection Time: 04/21/15  5:09 PM  Result Value Ref Range   Lactic Acid, Venous 0.87 0.5 - 2.0 mmol/L    UA no evidence of UTI  No results found for: HGBA1C  CrCl cannot be calculated (Unknown ideal weight.).  BNP (last 3 results) No results for input(s): PROBNP in the last 8760 hours.  Other results:  I have pearsonaly reviewed this: ECG REPORT Not obtained  Filed Weights   04/21/15 1507  Weight: 52.663 kg (116 lb 1.6 oz)     Cultures: No results found for: SDES, SPECREQUEST, CULT, REPTSTATUS   Radiological Exams on Admission: Dg Tibia/fibula Left  04/21/2015  CLINICAL DATA:  Pt fell against the door frame x 1 week ago, pt has a wound at the mid tib-fib  area, wound has a scab on it and the Dr did a wound culture, pt states it had white puss in it, leg is red and swollen and tender to touch EXAM: LEFT TIBIA AND FIBULA - 2 VIEW COMPARISON:  None. FINDINGS: There is skin defect along the anterior margin of the mid lower extremity pitting 8 x 4 mm.  No foreign body evident. No osseous abnormality. IMPRESSION: Soft tissue ulceration.  No osseous abnormality. Electronically Signed   By: Suzy Bouchard M.D.   On: 04/21/2015 14:07    Chart has been reviewed  Family   at  Bedside    Assessment/Plan   55 year old female with history of status post renal transplant currently on immunosuppression, left breast localized cancer status post lumpectomy and currently on tamoxifen presents of left lower leg cellulitis Present on Admission:  . Cellulitis/ wound infection -admit per  cellulitis protocol will change to vancomycin given purulent discharge, plain films showed no retained object tetanus shot has been updated. Will obtain MRSA screening, attempt to obtain wound culture,  obtain blood cultures  To help with further antibiotic adjustment  . Anemia in chronic renal disease currently stable continue to monitor  . Hypertension - currently under good control patient has not been on any medications  . Breast cancer, left breast (HCC) - chronic tamoxifen on hold while hospitalized defer to oncology food like to continued Hyponatremia-mild asymptomatic obtain urine electrolytes, even decreased by mouth intake for the past 24 hours secondary to patient stating she's been they busy give gentle fluids to see if they'll help. Per literature review CellCept can also cause hyponatremia possible contributor  Prophylaxis:  Lovenox   CODE STATUS:  FULL CODE   as per patient    Disposition:   To home once workup is complete and patient is stable  Other plan as per orders.  I have spent a total of  36min on this admission   Electa Sterry 04/21/2015, 7:43 PM   [facility]  Pager [phone]   after 2 AM please page floor coverage PA If 7AM-7PM, please contact the day team taking care of the patient  [email or web address]  Password [email or web address]

## 2015-04-21 NOTE — Progress Notes (Signed)
Pharmacy Antibiotic Note  Vanessa [name] is a 55 y.o. female admitted on 04/21/2015 with cellulitis.  Pharmacy has been consulted for vancomycin dosing. 1000mg  IV vancomycin ordered in ED -WBC= 5.7, afebrile, SCr= 0.96 and CrCl ~70  Plan: -Vancomycin 500mg  IV q12h -Will follow renal function, cultures and clinical progress   Weight: 116 lb 1.6 oz (52.663 kg)  Temp (24hrs), Avg:98.4 F (36.9 C), Min:98.4 F (36.9 C), Max:98.4 F (36.9 C)   Recent Labs Lab 04/21/15 1700 04/21/15 1709 04/21/15 2027  WBC 5.7  --   --   CREATININE 0.96  --   --   LATICACIDVEN  --  0.87 0.57    CrCl cannot be calculated (Unknown ideal weight.).    No Known Allergies  Antimicrobials this admission: 3/25 vanc>>   Microbiology results: 3/25 wound cx 3/25 blood x2  Thank you for allowing pharmacy to be a part of this patient's care.  Hildred Laser, Pharm D 04/21/2015 9:11 PM

## 2015-04-21 NOTE — ED Notes (Signed)
Pt  Reports       Symptoms  Of  Draining   Boil l  Lower  Leg     With     Symptoms  Since last    Sunday   It  Is  Draining   As  Well     As painfull   It  Is  Red  As  Well

## 2015-04-21 NOTE — ED Notes (Signed)
Pt was sent from [facility] for evaluation of antibiotics for cellulitis of LLE. Pt injured her LLE on barn door last week and has had increasing redness, pain, swelling at site since.

## 2015-04-21 NOTE — ED Provider Notes (Signed)
CSN: [id number]     Arrival date & time 04/21/15  1302 History   First MD Initiated Contact with Patient 04/21/15 1324     Chief Complaint  Patient presents with  . Leg Swelling   (Consider location/radiation/quality/duration/timing/severity/associated sxs/prior Treatment) Patient is a 55 y.o. female presenting with abscess. The history is provided by the patient.  Abscess Location:  Leg Leg abscess location:  L lower leg Abscess quality: draining, induration, painful, redness, warmth and weeping   Red streaking: no   Progression:  Worsening Pain details:    Quality:  Aching   Severity:  Moderate   Duration:  6 days   Progression:  Worsening Chronicity:  New Context: immunosuppression   Context comment:  Fell and scraped left leg on out building floor, small skin injury has gotten worse   Past Medical History  Diagnosis Date  . History of left breast cancer 03/16/2013  . Anemia in chronic kidney disease(285.21) 03/16/2013  . H/O kidney transplant 03/16/2013  . Brain aneurysm 03/16/2013    Age 49 (1983)  . Hx of primary hypertension 03/16/2013  . Hx gestational diabetes 03/16/2013   History reviewed. No pertinent past surgical history. History reviewed. No pertinent family history. Social History  Substance Use Topics  . Smoking status: Former Smoker -- 0.50 packs/day for 2 years    Types: Cigarettes  . Smokeless tobacco: Never Used  . Alcohol Use: No   OB History    No data available     Review of Systems  Constitutional: Negative.   Musculoskeletal: Positive for gait problem.  Skin: Positive for wound.  All other systems reviewed and are negative.   Allergies  Review of patient's allergies indicates no known allergies.  Home Medications   Prior to Admission medications   Medication Sig Start Date End Date Taking? Authorizing Provider  amoxicillin (AMOXIL) 500 MG capsule 2,000 mg. Take 4 tablets before dental procedures 03/03/13   Historical Provider, MD    colchicine 0.6 MG tablet Take 2 now and 1 in 1 hour.  May repeat dose once daily.  For gout attack. 02/08/12   Harden Mo, MD  furosemide (LASIX) 20 MG tablet Take 20 mg by mouth daily. Take 1/2 tablet daily.    Historical Provider, MD  mycophenolate (CELLCEPT) 500 MG tablet  02/21/13   Historical Provider, MD  predniSONE (DELTASONE) 5 MG tablet Take 1 tablet (5 mg total) by mouth daily with breakfast. 04/18/14   Chauncey Cruel, MD  SYNTHROID 137 MCG tablet  01/25/13   Historical Provider, MD  tacrolimus (PROGRAF) 1 MG capsule  02/27/13   Historical Provider, MD  tamoxifen (NOLVADEX) 20 MG tablet TAKE 1 TABLET BY MOUTH EVERY DAY 10/09/14   Chauncey Cruel, MD  tamoxifen (NOLVADEX) 20 MG tablet TAKE 1 TABLET BY MOUTH EVERY DAY 01/08/15   Chauncey Cruel, MD  ULORIC 40 MG tablet  02/18/13   Historical Provider, MD   Meds Ordered and Administered this Visit   Medications  Tdap (BOOSTRIX) injection 0.5 mL (0.5 mLs Intramuscular Given 04/21/15 1353)    LMP 01/16/2012 No data found.   Physical Exam  Constitutional: She is oriented to person, place, and time. She appears well-developed and well-nourished.  Musculoskeletal: She exhibits tenderness.  Neurological: She is alert and oriented to person, place, and time.  Skin: Skin is warm and dry. Lesion and rash noted. Rash is pustular. There is erythema.     Nursing note and vitals reviewed.   ED Course  Procedures (including critical care time)  Labs Review Labs Reviewed  CULTURE, ROUTINE-ABSCESS    Imaging Review Dg Tibia/fibula Left  04/21/2015  CLINICAL DATA:  Pt fell against the door frame x 1 week ago, pt has a wound at the mid tib-fib area, wound has a scab on it and the Dr did a wound culture, pt states it had white puss in it, leg is red and swollen and tender to touch EXAM: LEFT TIBIA AND FIBULA - 2 VIEW COMPARISON:  None. FINDINGS: There is skin defect along the anterior margin of the mid lower extremity pitting 8 x 4  mm. No foreign body evident. No osseous abnormality. IMPRESSION: Soft tissue ulceration.  No osseous abnormality. Electronically Signed   By: Suzy Bouchard M.D.   On: 04/21/2015 14:07   X-rays reviewed and report per radiologist.   Visual Acuity Review  Right Eye Distance:   Left Eye Distance:   Bilateral Distance:    Right Eye Near:   Left Eye Near:    Bilateral Near:         MDM  No diagnosis found. Meds ordered this encounter  Medications  . Tdap (BOOSTRIX) injection 0.5 mL    Sig:    Sent for prob iv abx or ortho eval in immunocomp woman with worsening cellulitis to left lower leg.    Billy Fischer, MD 04/21/15 1414

## 2015-04-22 LAB — COMPREHENSIVE METABOLIC PANEL
ALBUMIN: 2.9 g/dL — AB (ref 3.5–5.0)
ALK PHOS: 94 U/L (ref 38–126)
ALT: 18 U/L (ref 14–54)
ANION GAP: 8 (ref 5–15)
AST: 21 U/L (ref 15–41)
BUN: 16 mg/dL (ref 6–20)
CO2: 21 mmol/L — AB (ref 22–32)
Calcium: 7.8 mg/dL — ABNORMAL LOW (ref 8.9–10.3)
Chloride: 101 mmol/L (ref 101–111)
Creatinine, Ser: 0.98 mg/dL (ref 0.44–1.00)
GFR calc Af Amer: >60 mL/min (ref >60)
GFR calc non Af Amer: >60 mL/min (ref >60)
GLUCOSE: 75 mg/dL (ref 65–99)
POTASSIUM: 3.9 mmol/L (ref 3.5–5.1)
SODIUM: 130 mmol/L — AB (ref 135–145)
Total Bilirubin: 0.2 mg/dL — ABNORMAL LOW (ref 0.3–1.2)
Total Protein: 4.9 g/dL — ABNORMAL LOW (ref 6.5–8.1)

## 2015-04-22 LAB — CBC
HCT: 27.7 % — ABNORMAL LOW (ref 36.0–46.0)
HEMOGLOBIN: 8.9 g/dL — AB (ref 12.0–15.0)
MCH: 29 pg (ref 26.0–34.0)
MCHC: 32.1 g/dL (ref 30.0–36.0)
MCV: 90.2 fL (ref 78.0–100.0)
Platelets: 175 10*3/uL (ref 150–400)
RBC: 3.07 MIL/uL — ABNORMAL LOW (ref 3.87–5.11)
RDW: 14 % (ref 11.5–15.5)
WBC: 3.7 10*3/uL — ABNORMAL LOW (ref 4.0–10.5)

## 2015-04-22 LAB — TSH: TSH: 1.908 u[IU]/mL (ref 0.350–4.500)

## 2015-04-22 LAB — OSMOLALITY: OSMOLALITY: 271 mosm/kg — AB (ref 275–295)

## 2015-04-22 LAB — MAGNESIUM: Magnesium: 1.5 mg/dL — ABNORMAL LOW (ref 1.7–2.4)

## 2015-04-22 LAB — PHOSPHORUS: Phosphorus: 3.3 mg/dL (ref 2.5–4.6)

## 2015-04-22 LAB — MRSA PCR SCREENING: MRSA BY PCR: POSITIVE — AB

## 2015-04-22 MED ORDER — MUPIROCIN 2 % EX OINT
1.0000 "application " | TOPICAL_OINTMENT | Freq: Two times a day (BID) | CUTANEOUS | Status: DC
  Administered 2015-04-22 – 2015-04-23 (×4): 1 via NASAL
  Filled 2015-04-22: qty 22

## 2015-04-22 MED ORDER — CHLORHEXIDINE GLUCONATE CLOTH 2 % EX PADS
6.0000 | MEDICATED_PAD | Freq: Every day | CUTANEOUS | Status: DC
  Administered 2015-04-22 – 2015-04-23 (×2): 6 via TOPICAL

## 2015-04-22 NOTE — Progress Notes (Signed)
Utilization review completed.  

## 2015-04-22 NOTE — Progress Notes (Addendum)
[facility] PROGRESS NOTE  Vanessa [name] [id number] DOB: 1960-11-22 DOA: 04/21/2015 PCP: Vena Austria, MD  Assessment/Plan: 1. Cellulitis/infected wound- patient was started on vancomycin, wound culture has been obtained. It has improved from yesterday. We'll continue with vancomycin. Await wound culture results. 2. Hypertension- blood pressure well controlled, patient is not on medications. 3. Hyponatremia- patient has mild hyponatremia, sodium 130. Could be secondary to CellCept. Check serum osmolality. 4. Status post renal transplant- continue CellCept, Prograf, prednisone. 5. Hypothyroidism- continue Synthroid 6. DVT prophylaxis- Lovenox  Code Status: Full code Family Communication: No family at bedside Disposition Plan: Home when medically stable   Consultants:  None  Procedures:  None  Antibiotics:  Vancomycin  HPI/Subjective: 55 year old female with a history of breast cancer, currently on tamoxifen, renal transplant 2015, currently on CellCept and Prograf, prednisone who came to the hospital for cellulitis. One week ago she was getting heavy box which fell and noted a small puncture wound on the left leg and she has been using Epsom salt. Patient went to urgent care where the wound expressed pus and she was sent to the ER for further evaluation. Patient was started on vancomycin  This morning patient feels better. Wound redness has improved.  Objective: Filed Vitals:   04/21/15 2100 04/22/15 0519  BP: 133/84 136/77  Pulse: 67 72  Temp: 97.8 F (36.6 C) 98.9 F (37.2 C)  Resp: 18 18    Intake/Output Summary (Last 24 hours) at 04/22/15 1350 Last data filed at 04/22/15 0600  Gross per 24 hour  Intake    960 ml  Output      0 ml  Net    960 ml   Filed Weights   04/21/15 1507 04/21/15 2100  Weight: 52.663 kg (116 lb 1.6 oz) 52.6 kg (115 lb 15.4 oz)    Exam:   General:  Appears in no acute distress  Cardiovascular: S1-S2  regular  Respiratory: Clear to auscultation bilaterally  Abdomen: Soft, nontender, no organomegaly  Musculoskeletal:  Open wound noted in the anterior aspect of left lower leg. With surrounding erythema, nontender to palpation. Warm to touch. Small amount of purulent material noted in the wound.  Data Reviewed: Basic Metabolic Panel:  Recent Labs Lab 04/21/15 1700 04/22/15 0554  NA 129* 130*  K 4.3 3.9  CL 101 101  CO2 19* 21*  GLUCOSE 122* 75  BUN 14 16  CREATININE 0.96 0.98  CALCIUM 8.1* 7.8*  MG  --  1.5*  PHOS  --  3.3   Liver Function Tests:  Recent Labs Lab 04/22/15 0554  AST 21  ALT 18  ALKPHOS 94  BILITOT 0.2*  PROT 4.9*  ALBUMIN 2.9*   No results for input(s): LIPASE, AMYLASE in the last 168 hours. No results for input(s): AMMONIA in the last 168 hours. CBC:  Recent Labs Lab 04/21/15 1700 04/22/15 0554  WBC 5.7 3.7*  NEUTROABS 5.0  --   HGB 10.1* 8.9*  HCT 31.8* 27.7*  MCV 89.6 90.2  PLT 211 175    CBG: No results for input(s): GLUCAP in the last 168 hours.  Recent Results (from the past 240 hour(s))  MRSA PCR Screening     Status: Abnormal   Collection Time: 04/21/15 11:47 PM  Result Value Ref Range Status   MRSA by PCR POSITIVE (A) NEGATIVE Final    Comment:        The GeneXpert MRSA Assay (FDA approved for NASAL specimens only), is one component of a comprehensive MRSA  colonization surveillance program. It is not intended to diagnose MRSA infection nor to guide or monitor treatment for MRSA infections. RESULT CALLED TO, READ BACK BY AND VERIFIED WITH: WHITEHORN,S RN [phone] AT 0219 SKEEN,P      Studies: Dg Tibia/fibula Left  04/21/2015  CLINICAL DATA:  Pt fell against the door frame x 1 week ago, pt has a wound at the mid tib-fib area, wound has a scab on it and the Dr did a wound culture, pt states it had white puss in it, leg is red and swollen and tender to touch EXAM: LEFT TIBIA AND FIBULA - 2 VIEW COMPARISON:  None.  FINDINGS: There is skin defect along the anterior margin of the mid lower extremity pitting 8 x 4 mm. No foreign body evident. No osseous abnormality. IMPRESSION: Soft tissue ulceration.  No osseous abnormality. Electronically Signed   By: Suzy Bouchard M.D.   On: 04/21/2015 14:07   [place] Misc Soft Tissue  04/21/2015  CLINICAL DATA:  Patient fell 1 week ago with injury to the left leg. Now there is redness and swelling with an open wound. EXAM: ULTRASOUND left LOWER EXTREMITY LIMITED TECHNIQUE: Ultrasound examination of the lower extremity soft tissues was performed in the area of clinical concern. COMPARISON:  Left lower leg radiographs 04/21/2015 FINDINGS: Imaging obtained of the area of left calf wound. There is a focal skin defect corresponding to the wound. Echogenic foci are demonstrated within the skin defect possibly indicating foreign material or debris. There is edema in the soft tissues. No loculated fluid collections. Linear echogenic focus demonstrated in area remote from the skin defect. Color flow Doppler imaging suggests that this lesion is present within a vessel and is likely a phlebolith. IMPRESSION: Focal skin defect corresponding to skin wound. Echogenic foci within the wound suggesting foreign material. Infiltration in the soft tissues consistent with edema or cellulitis. Additional echogenic focus appears to be within a vessel and probably represents a phlebolith. Electronically Signed   By: Lucienne Capers M.D.   On: 04/21/2015 23:26    Scheduled Meds: . bacitracin   Topical BID  . Chlorhexidine Gluconate Cloth  6 each Topical Q0600  . enoxaparin (LOVENOX) injection  40 mg Subcutaneous Q24H  . febuxostat  40 mg Oral Daily  . levothyroxine  137 mcg Oral QAC breakfast  . mupirocin ointment  1 application Nasal BID  . mycophenolate  1,000 mg Oral Daily  . mycophenolate  500 mg Oral QHS  . predniSONE  5 mg Oral Q breakfast  . tacrolimus  1 mg Oral BID  . vancomycin  500 mg  Intravenous Q12H   Continuous Infusions:   Active Problems:   Anemia in chronic renal disease   H/O kidney transplant   Breast cancer, left breast ([place])   Cellulitis   Hypertension   Hyponatremia   Infected wound ([place])    Time spent: 25 min    [facility] Pager [phone]. If 7PM-7AM, please contact night-coverage at [email or web address], password [facility] 04/22/2015, 1:50 PM  LOS: 1 day

## 2015-04-23 DIAGNOSIS — L03116 Cellulitis of left lower limb: Principal | ICD-10-CM

## 2015-04-23 DIAGNOSIS — D631 Anemia in chronic kidney disease: Secondary | ICD-10-CM

## 2015-04-23 DIAGNOSIS — Z940 Kidney transplant status: Secondary | ICD-10-CM

## 2015-04-23 DIAGNOSIS — N189 Chronic kidney disease, unspecified: Secondary | ICD-10-CM

## 2015-04-23 DIAGNOSIS — E871 Hypo-osmolality and hyponatremia: Secondary | ICD-10-CM

## 2015-04-23 LAB — BASIC METABOLIC PANEL
Anion gap: 6 (ref 5–15)
BUN: 15 mg/dL (ref 6–20)
CHLORIDE: 109 mmol/L (ref 101–111)
CO2: 19 mmol/L — ABNORMAL LOW (ref 22–32)
Calcium: 8 mg/dL — ABNORMAL LOW (ref 8.9–10.3)
Creatinine, Ser: 0.94 mg/dL (ref 0.44–1.00)
GFR calc non Af Amer: >60 mL/min (ref >60)
Glucose, Bld: 78 mg/dL (ref 65–99)
POTASSIUM: 4.1 mmol/L (ref 3.5–5.1)
SODIUM: 134 mmol/L — AB (ref 135–145)

## 2015-04-23 LAB — CBC
HEMATOCRIT: 27.8 % — AB (ref 36.0–46.0)
HEMOGLOBIN: 8.7 g/dL — AB (ref 12.0–15.0)
MCH: 28.1 pg (ref 26.0–34.0)
MCHC: 31.3 g/dL (ref 30.0–36.0)
MCV: 89.7 fL (ref 78.0–100.0)
Platelets: 196 10*3/uL (ref 150–400)
RBC: 3.1 MIL/uL — AB (ref 3.87–5.11)
RDW: 14 % (ref 11.5–15.5)
WBC: 4 10*3/uL (ref 4.0–10.5)

## 2015-04-23 LAB — HEMOGLOBIN A1C
Hgb A1c MFr Bld: 4.9 % (ref 4.8–5.6)
MEAN PLASMA GLUCOSE: 94 mg/dL

## 2015-04-23 MED ORDER — CLINDAMYCIN HCL 300 MG PO CAPS: 300.0000 mg | ORAL_CAPSULE | Freq: Three times a day (TID) | ORAL | Status: DC

## 2015-04-23 NOTE — Discharge Instructions (Signed)
Cellulitis Cellulitis is an infection of the skin and the tissue beneath it. The infected area is usually red and tender. Cellulitis occurs most often in the arms and lower legs.  CAUSES  Cellulitis is caused by bacteria that enter the skin through cracks or cuts in the skin. The most common types of bacteria that cause cellulitis are staphylococci and streptococci. SIGNS AND SYMPTOMS   Redness and warmth.  Swelling.  Tenderness or pain.  Fever. DIAGNOSIS  Your health care provider can usually determine what is wrong based on a physical exam. Blood tests may also be done. TREATMENT  Treatment usually involves taking an antibiotic medicine. HOME CARE INSTRUCTIONS   Take your antibiotic medicine as directed by your health care provider. Finish the antibiotic even if you start to feel better.  Keep the infected arm or leg elevated to reduce swelling.  Apply a warm cloth to the affected area up to 4 times per day to relieve pain.  Take medicines only as directed by your health care provider.  Keep all follow-up visits as directed by your health care provider. SEEK MEDICAL CARE IF:   You notice red streaks coming from the infected area.  Your red area gets larger or turns dark in color.  Your bone or joint underneath the infected area becomes painful after the skin has healed.  Your infection returns in the same area or another area.  You notice a swollen bump in the infected area.  You develop new symptoms.  You have a fever. SEEK IMMEDIATE MEDICAL CARE IF:   You feel very sleepy.  You develop vomiting or diarrhea.  You have a general ill feeling (malaise) with muscle aches and pains.   This information is not intended to replace advice given to you by your health care provider. Make sure you discuss any questions you have with your health care provider.   Document Released: 10/23/2004 Document Revised: 10/04/2014 Document Reviewed: 03/31/2011 Elsevier Interactive  Patient Education 2016 Elsevier Inc. Clindamycin capsules What is this medicine? CLINDAMYCIN ([place] sin) is a lincosamide antibiotic. It is used to treat certain kinds of bacterial infections. It will not work for colds, flu, or other viral infections. This medicine may be used for other purposes; ask your health care provider or pharmacist if you have questions. What should I tell my health care provider before I take this medicine? They need to know if you have any of these conditions: -kidney disease -liver disease -stomach problems like colitis -an unusual or allergic reaction to clindamycin, lincomycin, or other medicines, foods, dyes like tartrazine or preservatives -pregnant or trying to get pregnant -breast-feeding How should I use this medicine? Take this medicine by mouth with a full glass of water. Follow the directions on the prescription label. You can take this medicine with food or on an empty stomach. If the medicine upsets your stomach, take it with food. Take your medicine at regular intervals. Do not take your medicine more often than directed. Take all of your medicine as directed even if you think your are better. Do not skip doses or stop your medicine early. Talk to your pediatrician regarding the use of this medicine in children. Special care may be needed. Overdosage: If you think you have taken too much of this medicine contact a poison control center or emergency room at once. NOTE: This medicine is only for you. Do not share this medicine with others. What if I miss a dose? If you miss a  dose, take it as soon as you can. If it is almost time for your next dose, take only that dose. Do not take double or extra doses. What may interact with this medicine? -birth control pills -chloramphenicol -erythromycin -kaolin products This list may not describe all possible interactions. Give your health care provider a list of all the medicines, herbs, non-prescription  drugs, or dietary supplements you use. Also tell them if you smoke, drink alcohol, or use illegal drugs. Some items may interact with your medicine. What should I watch for while using this medicine? Tell your doctor or healthcare professional if your symptoms do not start to get better or if they get worse. Do not treat diarrhea with over the counter products. Contact your doctor if you have diarrhea that lasts more than 2 days or if it is severe and watery. What side effects may I notice from receiving this medicine? Side effects that you should report to your doctor or health care professional as soon as possible: -allergic reactions like skin rash, itching or hives, swelling of the face, lips, or tongue -dark urine -pain on swallowing -redness, blistering, peeling or loosening of the skin, including inside the mouth -unusual bleeding or bruising -unusually weak or tired -yellowing of eyes or skin Side effects that usually do not require medical attention (report to your doctor or health care professional if they continue or are bothersome): -diarrhea -itching in the rectal or genital area -joint pain -nausea, vomiting -stomach pain This list may not describe all possible side effects. Call your doctor for medical advice about side effects. You may report side effects to FDA at 1-800-FDA-1088. Where should I keep my medicine? Keep out of the reach of children. Store at room temperature between 20 and 25 degrees C (68 and 77 degrees F). Throw away any unused medicine after the expiration date. NOTE: This sheet is a summary. It may not cover all possible information. If you have questions about this medicine, talk to your doctor, pharmacist, or health care provider.    2016, Elsevier/Gold Standard. (2012-08-19 16:12:32)

## 2015-04-23 NOTE — Discharge Summary (Signed)
Physician Discharge Summary  Vanessa [name] [id number] DOB: 12-Jun-1960 DOA: 04/21/2015  PCP: Vena Austria, MD  Admit date: 04/21/2015 Discharge date: 04/23/2015  Recommendations for Outpatient Follow-up:  1. Continue clindamycin for 8 days and discharged for treatment of cellulitis.  LLE wound Measurement: 0.7cm x 1.0cm x 0.4cm  Wound bed:100% yellow slough Drainage (amount, consistency, odor) minimal, serosanguinous  Periwound: intact with redness that has improved since admission Dressing procedure/placement/frequency: Wound is 100% slough in base but patient reports MD sending her home with antibiotic ointment. - Patient aware of dressing changes which she knows how to do. Supplies provided.  Discharge Diagnoses:  Active Problems:   Anemia in chronic renal disease   H/O kidney transplant   Breast cancer, left breast (HCC)   Cellulitis   Hypertension   Hyponatremia   Infected wound ([place])   Discharge Condition: stable; pt wants to go home today   Diet recommendation: as tolerated   History of present illness:  55 year old female with a history of breast cancer, on tamoxifen, renal transplant 2015, on CellCept and Prograf, prednisone. Patient presented to [facility] with worsening redness on left lower extremity after she had a small puncture wound. She used Epsom salt with no significant improvement. She then started to notice pus coming out from the wound. She was admitted to hospital for IV antibiotics. She was started on vancomycin.  Hospital Course:   Principal problem: Left lower extremity cellulitis / leukopenia - Wound looks much better, no open wound at this time. - She was getting vancomycin in hospital and she will continue taking clindamycin on discharge for 8 days which would complete antibiotic treatment for a total of 10 days altogether. - Seen by WOC in hospital   Hyponatremia - Likely prerenal - Improved with IV fluids    Status post renal transplant - Continue CellCept, Prograf, prednisone  Hypothyroidism - Continue Synthroid  Anemia of chronic renal disease - Hemoglobin stable at 8.9 and 8.7 in past 48 hours - She did have drop in hemoglobin since admission value of 10.1 but this is likely dilutional  - No reports of bleeding   DVT prophylaxis - Lovenox sub Q in hospital   Code Status: Full code Family Communication: No family at bedside   Consultants:  None  Procedures:  None  Antibiotics:  Vancomycin in hospital    Signed:  Leisa Lenz, MD  [facility] 04/23/2015, 11:29 AM  Pager #: [phone]  Time spent in minutes: more than 30 minutes   Discharge Exam: Filed Vitals:   04/22/15 2029 04/23/15 0540  BP: 125/67 135/67  Pulse: 74 76  Temp: 98.9 F (37.2 C) 97.9 F (36.6 C)  Resp: 18 18   Filed Vitals:   04/22/15 0519 04/22/15 1412 04/22/15 2029 04/23/15 0540  BP: 136/77 134/82 125/67 135/67  Pulse: 72 74 74 76  Temp: 98.9 F (37.2 C) 98.4 F (36.9 C) 98.9 F (37.2 C) 97.9 F (36.6 C)  TempSrc: Oral Oral Oral Oral  Resp: 18 18 18 18   Weight:      SpO2: 100% 100% 99% 97%    General: Pt is alert, follows commands appropriately, not in acute distress Cardiovascular: Regular rate and rhythm, S1/S2 +, no murmurs Respiratory: Clear to auscultation bilaterally, no wheezing, no crackles, no rhonchi Abdominal: Soft, non tender, non distended, bowel sounds +, no guarding Extremities: Cellulitis on LLE improving, small central area of scaling but no open wound where cellulitis is on anterior shaft lower extremity  Neuro:  Grossly nonfocal  Discharge Instructions  Discharge Instructions    Call MD for:  difficulty breathing, headache or visual disturbances    Complete by:  As directed      Call MD for:  persistant dizziness or light-headedness    Complete by:  As directed      Call MD for:  persistant nausea and vomiting    Complete by:  As directed       Call MD for:  severe uncontrolled pain    Complete by:  As directed      Diet - low sodium heart healthy    Complete by:  As directed      Discharge instructions    Complete by:  As directed   Continue clindamycin for 8 days on discharge.     Increase activity slowly    Complete by:  As directed             Medication List    STOP taking these medications        amoxicillin 500 MG capsule  Commonly known as:  AMOXIL      TAKE these medications        acetaminophen 500 MG tablet  Commonly known as:  TYLENOL  Take 1,000 mg by mouth every 6 (six) hours as needed for moderate pain.     clindamycin 300 MG capsule  Commonly known as:  CLEOCIN  Take 1 capsule (300 mg total) by mouth 3 (three) times daily.     colchicine 0.6 MG tablet  Take 2 now and 1 in 1 hour.  May repeat dose once daily.  For gout attack.     [employer] VITAMIN B12 1000 MCG tablet  Generic drug:  cyanocobalamin  Take 2,000 mcg by mouth daily.     furosemide 20 MG tablet  Commonly known as:  LASIX  Take 20 mg by mouth daily as needed for fluid or edema. Take 1/2 tablet daily.     mycophenolate 500 MG tablet  Commonly known as:  CELLCEPT  Take 500-1,000 mg by mouth 2 (two) times daily. Takes 2 tabs in am and 1 tab in pm     predniSONE 5 MG tablet  Commonly known as:  DELTASONE  Take 1 tablet (5 mg total) by mouth daily with breakfast.     SYNTHROID 137 MCG tablet  Generic drug:  levothyroxine  Take 137 mcg by mouth daily before breakfast.     tacrolimus 1 MG capsule  Commonly known as:  PROGRAF  Take 1 mg by mouth 2 (two) times daily.     tamoxifen 20 MG tablet  Commonly known as:  NOLVADEX  TAKE 1 TABLET BY MOUTH EVERY DAY     tamoxifen 20 MG tablet  Commonly known as:  NOLVADEX  TAKE 1 TABLET BY MOUTH EVERY DAY     ULORIC 40 MG tablet  Generic drug:  febuxostat  Take 40 mg by mouth daily.     Vitamin D (Ergocalciferol) 50000 units Caps capsule  Commonly known as:  DRISDOL  Take 1  capsule by mouth every Sunday.           Follow-up Information    Follow up with Vena Austria, MD. Schedule an appointment as soon as possible for a visit in 1 week.   Specialty:  Family Medicine   Why:  Follow up appt after recent hospitalization   Contact information:   [address] [place] [address] [phone]  The results of significant diagnostics from this hospitalization (including imaging, microbiology, ancillary and laboratory) are listed below for reference.    Significant Diagnostic Studies: Dg Tibia/fibula Left  04/21/2015  CLINICAL DATA:  Pt fell against the door frame x 1 week ago, pt has a wound at the mid tib-fib area, wound has a scab on it and the Dr did a wound culture, pt states it had white puss in it, leg is red and swollen and tender to touch EXAM: LEFT TIBIA AND FIBULA - 2 VIEW COMPARISON:  None. FINDINGS: There is skin defect along the anterior margin of the mid lower extremity pitting 8 x 4 mm. No foreign body evident. No osseous abnormality. IMPRESSION: Soft tissue ulceration.  No osseous abnormality. Electronically Signed   By: Suzy Bouchard M.D.   On: 04/21/2015 14:07   [place] Misc Soft Tissue  04/21/2015  CLINICAL DATA:  Patient fell 1 week ago with injury to the left leg. Now there is redness and swelling with an open wound. EXAM: ULTRASOUND left LOWER EXTREMITY LIMITED TECHNIQUE: Ultrasound examination of the lower extremity soft tissues was performed in the area of clinical concern. COMPARISON:  Left lower leg radiographs 04/21/2015 FINDINGS: Imaging obtained of the area of left calf wound. There is a focal skin defect corresponding to the wound. Echogenic foci are demonstrated within the skin defect possibly indicating foreign material or debris. There is edema in the soft tissues. No loculated fluid collections. Linear echogenic focus demonstrated in area remote from the skin defect. Color flow Doppler imaging suggests  that this lesion is present within a vessel and is likely a phlebolith. IMPRESSION: Focal skin defect corresponding to skin wound. Echogenic foci within the wound suggesting foreign material. Infiltration in the soft tissues consistent with edema or cellulitis. Additional echogenic focus appears to be within a vessel and probably represents a phlebolith. Electronically Signed   By: Lucienne Capers M.D.   On: 04/21/2015 23:26    Microbiology: Recent Results (from the past 240 hour(s))  Culture, blood (routine x 2)     Status: None (Preliminary result)   Collection Time: 04/21/15  9:51 PM  Result Value Ref Range Status   Specimen Description BLOOD LEFT ANTECUBITAL  Final   Special Requests BOTTLES DRAWN AEROBIC AND ANAEROBIC 5CC  Final   Culture NO GROWTH 2 DAYS  Final   Report Status PENDING  Incomplete  Culture, blood (routine x 2)     Status: None (Preliminary result)   Collection Time: 04/21/15  9:57 PM  Result Value Ref Range Status   Specimen Description BLOOD LEFT ANTECUBITAL  Final   Special Requests BOTTLES DRAWN AEROBIC AND ANAEROBIC 5CC  Final   Culture NO GROWTH 2 DAYS  Final   Report Status PENDING  Incomplete  MRSA PCR Screening     Status: Abnormal   Collection Time: 04/21/15 11:47 PM  Result Value Ref Range Status   MRSA by PCR POSITIVE (A) NEGATIVE Final    Comment:        The GeneXpert MRSA Assay (FDA approved for NASAL specimens only), is one component of a comprehensive MRSA colonization surveillance program. It is not intended to diagnose MRSA infection nor to guide or monitor treatment for MRSA infections. RESULT CALLED TO, READ BACK BY AND VERIFIED WITH: WHITEHORN,S RN [id number] AT 0219 SKEEN,P      Labs: Basic Metabolic Panel:  Recent Labs Lab 04/21/15 1700 04/22/15 0554 04/23/15 0452  NA 129* 130* 134*  K 4.3 3.9 4.1  CL 101 101 109  CO2 19* 21* 19*  GLUCOSE 122* 75 78  BUN 14 16 15   CREATININE 0.96 0.98 0.94  CALCIUM 8.1* 7.8* 8.0*  MG  --   1.5*  --   PHOS  --  3.3  --    Liver Function Tests:  Recent Labs Lab 04/22/15 0554  AST 21  ALT 18  ALKPHOS 94  BILITOT 0.2*  PROT 4.9*  ALBUMIN 2.9*   No results for input(s): LIPASE, AMYLASE in the last 168 hours. No results for input(s): AMMONIA in the last 168 hours. CBC:  Recent Labs Lab 04/21/15 1700 04/22/15 0554 04/23/15 0452  WBC 5.7 3.7* 4.0  NEUTROABS 5.0  --   --   HGB 10.1* 8.9* 8.7*  HCT 31.8* 27.7* 27.8*  MCV 89.6 90.2 89.7  PLT 211 175 196   Cardiac Enzymes: No results for input(s): CKTOTAL, CKMB, CKMBINDEX, TROPONINI in the last 168 hours. BNP: BNP (last 3 results) No results for input(s): BNP in the last 8760 hours.  ProBNP (last 3 results) No results for input(s): PROBNP in the last 8760 hours.  CBG: No results for input(s): GLUCAP in the last 168 hours.

## 2015-04-23 NOTE — Progress Notes (Signed)
Pt ready for discharge. Education/instructions reviewed with pt and all questions/concerns addressed. IV removed and belongings gathered. Pt will be transported out via wheelchair. Will continue to monitor 

## 2015-04-23 NOTE — Consult Note (Signed)
WOC wound consult note Reason for Consult: LLE wound Patient fell with box of magazines and tripped while stepping up into an out building. Sustained injury which led to cellulitis in this LE Wound type: trauma Measurement: 0.7cm x 1.0cm x 0.4cm  Wound bed:100% yellow slough Drainage (amount, consistency, odor) minimal, serosanguinous  Periwound: intact with redness that has improved since admission Dressing procedure/placement/frequency: When I arrived dressing just changed. MD has ordered bacitracin.  Wound is 100% slough in base but patient reports MD sending her home with antibiotic ointment. Will not change POC at this time.  Bacitracin per MD Orders, cover with dry dressing. Change daily. FU with PCP for wound assessment after DC.    Discussed POC with patient and bedside nurse.  Re consult if needed, will not follow at this time. Thanks  Vanessa [name], [place] [phone])

## 2015-04-25 LAB — CULTURE, ROUTINE-ABSCESS

## 2015-04-25 LAB — WOUND CULTURE

## 2015-04-25 NOTE — Progress Notes (Signed)
WCx result with mod mrsa sens to clinda Pt d/c on clinda for 7 days on discharge   [employer]

## 2015-04-26 LAB — CULTURE, BLOOD (ROUTINE X 2)
CULTURE: NO GROWTH
Culture: NO GROWTH

## 2015-04-30 DIAGNOSIS — D631 Anemia in chronic kidney disease: Secondary | ICD-10-CM | POA: Diagnosis not present

## 2015-04-30 DIAGNOSIS — Z22322 Carrier or suspected carrier of Methicillin resistant Staphylococcus aureus: Secondary | ICD-10-CM | POA: Diagnosis not present

## 2015-04-30 DIAGNOSIS — L03116 Cellulitis of left lower limb: Secondary | ICD-10-CM | POA: Diagnosis not present

## 2015-05-07 DIAGNOSIS — L03116 Cellulitis of left lower limb: Secondary | ICD-10-CM | POA: Diagnosis not present

## 2015-05-07 DIAGNOSIS — Z22322 Carrier or suspected carrier of Methicillin resistant Staphylococcus aureus: Secondary | ICD-10-CM | POA: Diagnosis not present

## 2015-05-11 ENCOUNTER — Telehealth: Payer: Self-pay | Admitting: Nurse Practitioner

## 2015-05-11 NOTE — Telephone Encounter (Signed)
lvm to inform pt of appt date/time change 5/1 to 5/8 due to HB being on PAL

## 2015-05-28 ENCOUNTER — Ambulatory Visit: Payer: BLUE CROSS/BLUE SHIELD | Admitting: Nurse Practitioner

## 2015-05-29 ENCOUNTER — Ambulatory Visit: Payer: BLUE CROSS/BLUE SHIELD | Admitting: Nurse Practitioner

## 2015-05-30 ENCOUNTER — Other Ambulatory Visit: Payer: Self-pay

## 2015-05-30 DIAGNOSIS — Z1231 Encounter for screening mammogram for malignant neoplasm of breast: Secondary | ICD-10-CM

## 2015-06-04 ENCOUNTER — Ambulatory Visit: Payer: BLUE CROSS/BLUE SHIELD | Admitting: Nurse Practitioner

## 2015-06-04 ENCOUNTER — Other Ambulatory Visit: Payer: BLUE CROSS/BLUE SHIELD

## 2015-06-04 DIAGNOSIS — E559 Vitamin D deficiency, unspecified: Secondary | ICD-10-CM | POA: Diagnosis not present

## 2015-06-04 DIAGNOSIS — D631 Anemia in chronic kidney disease: Secondary | ICD-10-CM | POA: Diagnosis not present

## 2015-06-04 DIAGNOSIS — E782 Mixed hyperlipidemia: Secondary | ICD-10-CM | POA: Diagnosis not present

## 2015-06-04 DIAGNOSIS — Z940 Kidney transplant status: Secondary | ICD-10-CM | POA: Diagnosis not present

## 2015-06-04 DIAGNOSIS — E038 Other specified hypothyroidism: Secondary | ICD-10-CM | POA: Diagnosis not present

## 2015-06-04 DIAGNOSIS — E1022 Type 1 diabetes mellitus with diabetic chronic kidney disease: Secondary | ICD-10-CM | POA: Diagnosis not present

## 2015-06-05 DIAGNOSIS — E782 Mixed hyperlipidemia: Secondary | ICD-10-CM | POA: Diagnosis not present

## 2015-06-05 DIAGNOSIS — I10 Essential (primary) hypertension: Secondary | ICD-10-CM | POA: Diagnosis not present

## 2015-06-05 DIAGNOSIS — Z940 Kidney transplant status: Secondary | ICD-10-CM | POA: Diagnosis not present

## 2015-06-05 DIAGNOSIS — E1022 Type 1 diabetes mellitus with diabetic chronic kidney disease: Secondary | ICD-10-CM | POA: Diagnosis not present

## 2015-06-21 DIAGNOSIS — Z940 Kidney transplant status: Secondary | ICD-10-CM | POA: Diagnosis not present

## 2015-06-21 DIAGNOSIS — N189 Chronic kidney disease, unspecified: Secondary | ICD-10-CM | POA: Diagnosis not present

## 2015-06-21 DIAGNOSIS — I10 Essential (primary) hypertension: Secondary | ICD-10-CM | POA: Diagnosis not present

## 2015-06-21 DIAGNOSIS — D631 Anemia in chronic kidney disease: Secondary | ICD-10-CM | POA: Diagnosis not present

## 2015-07-04 ENCOUNTER — Other Ambulatory Visit: Payer: BLUE CROSS/BLUE SHIELD

## 2015-07-04 ENCOUNTER — Ambulatory Visit: Payer: BLUE CROSS/BLUE SHIELD | Admitting: Nurse Practitioner

## 2015-07-11 ENCOUNTER — Other Ambulatory Visit: Payer: Self-pay

## 2015-07-11 DIAGNOSIS — C50912 Malignant neoplasm of unspecified site of left female breast: Secondary | ICD-10-CM

## 2015-07-12 ENCOUNTER — Other Ambulatory Visit (HOSPITAL_BASED_OUTPATIENT_CLINIC_OR_DEPARTMENT_OTHER): Payer: BLUE CROSS/BLUE SHIELD

## 2015-07-12 ENCOUNTER — Telehealth: Payer: Self-pay | Admitting: Nurse Practitioner

## 2015-07-12 ENCOUNTER — Other Ambulatory Visit: Payer: Self-pay | Admitting: *Deleted

## 2015-07-12 ENCOUNTER — Encounter: Payer: Self-pay | Admitting: Nurse Practitioner

## 2015-07-12 ENCOUNTER — Ambulatory Visit (HOSPITAL_BASED_OUTPATIENT_CLINIC_OR_DEPARTMENT_OTHER): Payer: BLUE CROSS/BLUE SHIELD | Admitting: Nurse Practitioner

## 2015-07-12 VITALS — BP 118/76 | HR 71 | Temp 98.2°F | Resp 18 | Ht 62.0 in | Wt 122.0 lb

## 2015-07-12 DIAGNOSIS — Z853 Personal history of malignant neoplasm of breast: Secondary | ICD-10-CM

## 2015-07-12 DIAGNOSIS — Z940 Kidney transplant status: Secondary | ICD-10-CM

## 2015-07-12 DIAGNOSIS — C50912 Malignant neoplasm of unspecified site of left female breast: Secondary | ICD-10-CM

## 2015-07-12 DIAGNOSIS — C50112 Malignant neoplasm of central portion of left female breast: Secondary | ICD-10-CM

## 2015-07-12 LAB — CBC WITH DIFFERENTIAL/PLATELET
BASO%: 0.2 % (ref 0.0–2.0)
Basophils Absolute: 0 10*3/uL (ref 0.0–0.1)
EOS%: 0.2 % (ref 0.0–7.0)
Eosinophils Absolute: 0 10*3/uL (ref 0.0–0.5)
HEMATOCRIT: 32 % — AB (ref 34.8–46.6)
HGB: 10.4 g/dL — ABNORMAL LOW (ref 11.6–15.9)
LYMPH#: 0.6 10*3/uL — AB (ref 0.9–3.3)
LYMPH%: 9 % — ABNORMAL LOW (ref 14.0–49.7)
MCH: 28.7 pg (ref 25.1–34.0)
MCHC: 32.5 g/dL (ref 31.5–36.0)
MCV: 88.2 fL (ref 79.5–101.0)
MONO#: 0.3 10*3/uL (ref 0.1–0.9)
MONO%: 4.9 % (ref 0.0–14.0)
NEUT#: 5.6 10*3/uL (ref 1.5–6.5)
NEUT%: 85.7 % — AB (ref 38.4–76.8)
Platelets: 169 10*3/uL (ref 145–400)
RBC: 3.63 10*6/uL — ABNORMAL LOW (ref 3.70–5.45)
RDW: 13.6 % (ref 11.2–14.5)
WBC: 6.5 10*3/uL (ref 3.9–10.3)

## 2015-07-12 LAB — COMPREHENSIVE METABOLIC PANEL
ALT: 17 U/L (ref 0–55)
AST: 14 U/L (ref 5–34)
Albumin: 3.5 g/dL (ref 3.5–5.0)
Alkaline Phosphatase: 85 U/L (ref 40–150)
Anion Gap: 7 mEq/L (ref 3–11)
BUN: 21.7 mg/dL (ref 7.0–26.0)
CALCIUM: 8.6 mg/dL (ref 8.4–10.4)
CHLORIDE: 107 meq/L (ref 98–109)
CO2: 22 meq/L (ref 22–29)
CREATININE: 1.1 mg/dL (ref 0.6–1.1)
EGFR: 57 mL/min/{1.73_m2} — ABNORMAL LOW (ref >90)
GLUCOSE: 103 mg/dL (ref 70–140)
POTASSIUM: 4.8 meq/L (ref 3.5–5.1)
SODIUM: 135 meq/L — AB (ref 136–145)
Total Bilirubin: 0.33 mg/dL (ref 0.20–1.20)
Total Protein: 5.6 g/dL — ABNORMAL LOW (ref 6.4–8.3)

## 2015-07-12 MED ORDER — TAMOXIFEN CITRATE 20 MG PO TABS: 20.0000 mg | ORAL_TABLET | Freq: Every day | ORAL | Status: DC

## 2015-07-12 NOTE — Telephone Encounter (Signed)
appt made and avs printed °

## 2015-07-12 NOTE — Progress Notes (Signed)
[place]  Telephone:(336) [phone] Fax:(336) [phone]     ID: Vanessa [name] OB: 05-10-1960  MR#: [id number]  SFK#:[id number]  PCP: Vena Austria, MD GYN:   SU: Autumn Messing, MD [place]:  Thea Silversmith, MD OTHER MD: Donald Prose, MD;  Lorne Skeens, MD;  Pearson Grippe, MD  CHIEF COMPLAINT:  Hx left Breast Cancer  CURRENT TREATMENT: Tamoxifen  BREAST CANCER HISTORY: From Dr. Virgie Dad original office note, dated 05/02/2009:  Vanessa [name] (pronounced March-e-soda) had routine mammography 01/17/2009 which suggested a possible mass in the left breast.  She was recalled for diagnostic mammography and ultrasonography 02/13/2009.This showed very dense breasts but spot compression views confirmed a mass-like density in the upper central portion of the left breast, measuring up to 2.1 cm.  This was not palpable by Dr. Owens Shark.  An ultrasound showed only normal appearing fibroglandular tissue.    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  The tumor was strongly estrogen receptor positive at 98%, progesterone receptor positive at 99%, with a borderline high proliferation fraction at 23%, and Her-2 negative by CISH with a ratio of 1.36.    With this information, the patient was referred to Dr. Marlou Starks, and bilateral breast MRIs, and she was set up for breast-specific gamma imaging at [facility] (the patient was not able to have an MRI because of prior surgeries) on February 23, 2009.  This showed intense focal increased activity at 11 o'clock in the left breast, corresponding with the prior biopsy site.  There was also patchy increased activity through both breasts, most prominently in the upper outer quadrant bilaterally.  Repeat ultrasound of both upper outer quadrants and biopsies were suggested.  These were performed on February 7th at [facility] and Dr. Miquel Dunn found multiple hypoechoic nodularities bilaterally.  In particular, in the upper outer quadrant of the right breast, there was a hypoechoic nodule at 11 o'clock and in the left breast, a similar nodule at 3 o'clock.  These two areas were biopsied ([id number]) and showed only fibrocytic changes.    With this information, the patient proceeded to definitive local treatment March 7th under Dr. 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  No evidence of lymphovascular invasion and 0 of 6 lymph nodes involved.  (These 6 lymph nodes were hot and had blue dye per Dr. Ethlyn Gallery description).  The patient accordingly stages as a T1b N0 MX.    Subsequent treatment history is as follows.   INTERVAL HISTORY: Vanessa [name] returns today for follow-up of her early stage estrogen receptor positive breast cancer. She has been on tamoxifen since June 2011 and tolerates this drug well with no side effects that she is aware of. She denies hot flashes or vaginal changes. The interval history is generally unremarkable.  REVIEW OF SYSTEMS: Vanessa [name] is spending more time caring for her aging father, so she has gained a little weight. She still walks 4 times weekly and climbs stairs for exercises. She is very dedicated to her diet and does not intend to get over 125lbs. A detailed review of systems is otherwise entirely negative.   PAST MEDICAL HISTORY: Past Medical History  Diagnosis Date  . History of left breast cancer 03/16/2013  . Anemia in chronic kidney disease(285.21) 03/16/2013  .  H/O kidney transplant 03/16/2013  . Brain aneurysm 03/16/2013    Age 55 (1983)  . Hx of primary hypertension 03/16/2013  . Hx gestational diabetes 03/16/2013    PAST SURGICAL HISTORY: No past surgical history on file.  FAMILY HISTORY:  (Updated February 2015) Family  History  Problem Relation Age of Onset  . Brain cancer Mother   . Diabetes type II Father   . Parkinson's disease Father    The patient's father is alive at age 16.  He has diabetes and Parkinson's disease. The patient's mother is 45.  She apparently had a glioblastoma removed and treated at [facility] in September of 2009.  This recurred in February 2014, but she is currenty doing well.  Again, her mother was the donor for Vanessa [name]'s kidney.  The patient has her twin sister who is doing very well and a younger sister.  There is no history of breast or ovarian cancer in the family.    GYNECOLOGIC HISTORY: (Updated February 2015)  She is GX P2, first pregnancy to term at age 71.   Her periods are irregular, LMP March 2014.   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 Her husband of 30+ years, Evette Doffing (goes by Vanessa [name]) is self-employed in maintenance although currently unemployed.  Their 57 year old [name], Vanessa [name], works full-time and is also in school.  Daughter Vanessa [name], 55, goes to [employer].  The patient attends a friend's church.    ADVANCED DIRECTIVES: Not in place; at her 04/18/2014 visit the patient was given the appropriate documents 2 complete and notarize at her discretion   HEALTH MAINTENANCE:  (Updated February 2015) Social History  Substance Use Topics  . Smoking status: Former Smoker -- 0.50 packs/day for 2 years    Types: Cigarettes  . Smokeless tobacco: Never Used  . Alcohol Use: No     Colonoscopy:  2005/Dr. Wynetta Emery (was due again in 2010)  PAP:  Sept 2012/Dr. Sun  Bone density:  May 2011, Normal   Lipid panel:  UTD, Dr. Alyson Ingles   No Known Allergies  Current Outpatient Prescriptions  Medication Sig Dispense Refill  . acetaminophen (TYLENOL) 500 MG tablet Take 1,000 mg by mouth every 6 (six) hours as needed for moderate pain.    Marland Kitchen atenolol (TENORMIN) 25 MG tablet     . colchicine 0.6 MG tablet Take 2 now and 1  in 1 hour.  May repeat dose once daily.  For gout attack. (Patient taking differently: Take 0.6 mg by mouth daily as needed (for flareup). Take 2 now and 1 in 1 hour.  May repeat dose once daily.  For gout attack.) 12 tablet 0  . [facility]S VITAMIN B12 1000 MCG tablet Take 2,000 mcg by mouth daily.  12  . mycophenolate (CELLCEPT) 500 MG tablet Take 500-1,000 mg by mouth 2 (two) times daily. Takes 2 tabs in am and 1 tab in pm    . predniSONE (DELTASONE) 5 MG tablet Take 1 tablet (5 mg total) by mouth daily with breakfast. 90 tablet 4  . SYNTHROID 137 MCG tablet Take 137 mcg by mouth daily before breakfast.     . tacrolimus (PROGRAF) 1 MG capsule Take 1 mg by mouth 2 (two) times daily.     Marland Kitchen ULORIC 40 MG tablet Take 40 mg by mouth daily.     . Vitamin D, Ergocalciferol, (DRISDOL) 50000 units CAPS capsule Take 1 capsule by mouth every Sunday.  0  . amoxicillin (AMOXIL)  500 MG capsule Reported on 07/12/2015  3  . furosemide (LASIX) 20 MG tablet Take 20 mg by mouth daily as needed for fluid or edema. Reported on 07/12/2015    . tamoxifen (NOLVADEX) 20 MG tablet Take 1 tablet (20 mg total) by mouth daily. 90 tablet 4   No current facility-administered medications for this visit.    OBJECTIVE:  Middle-aged white woman in no acute distress Filed Vitals:   07/12/15 1455  BP: 118/76  Pulse: 71  Temp: 98.2 F (36.8 C)  Resp: 18     Body mass index is 22.31 kg/(m^2).    ECOG FS: 0 Filed Weights   07/12/15 1455  Weight: 122 lb (55.339 kg)   Skin: warm, dry  HEENT: sclerae anicteric, conjunctivae pink, oropharynx clear. No thrush or mucositis.  Lymph Nodes: No cervical or supraclavicular lymphadenopathy  Lungs: clear to auscultation bilaterally, no rales, wheezes, or rhonci  Heart: regular rate and rhythm  Abdomen: round, soft, non tender, positive bowel sounds  Musculoskeletal: No focal spinal tenderness, no peripheral edema  Neuro: non focal, well oriented, positive affect  Breasts: left breast status  post lumpectomy and radiation. No evidence of recurrent disease. Left axilla benign. Right breast unremarkable.  LAB RESULTS:    Lab Results  Component Value Date   WBC 6.5 07/12/2015   NEUTROABS 5.6 07/12/2015   HGB 10.4* 07/12/2015   HCT 32.0* 07/12/2015   MCV 88.2 07/12/2015   PLT 169 07/12/2015      Chemistry      Component Value Date/Time   NA 134* 04/23/2015 0452   NA 138 04/18/2014 0907   K 4.1 04/23/2015 0452   K 4.0 04/18/2014 0907   CL 109 04/23/2015 0452   CO2 19* 04/23/2015 0452   CO2 21* 04/18/2014 0907   BUN 15 04/23/2015 0452   BUN 12.2 04/18/2014 0907   CREATININE 0.94 04/23/2015 0452   CREATININE 0.8 04/18/2014 0907      Component Value Date/Time   CALCIUM 8.0* 04/23/2015 0452   CALCIUM 8.1* 04/18/2014 0907   ALKPHOS 94 04/22/2015 0554   ALKPHOS 128 04/18/2014 0907   AST 21 04/22/2015 0554   AST 15 04/18/2014 0907   ALT 18 04/22/2015 0554   ALT 18 04/18/2014 0907   BILITOT 0.2* 04/22/2015 0554   BILITOT 0.35 04/18/2014 0907      STUDIES: No results found.  ASSESSMENT: 55 y.o. Vanessa [name] woman: 1. Remote History of Renal transplant, with persistent anemia followed by her nephrologist, Dr. Joelyn Oms. Receives Aranesp injections as needed. 2. Status post left lumpectomy and sentinel lymph node sampling March 2011 for a T1b N0 grade 1 invasive ductal carcinoma, with an MIB - 1 of 23%.  The tumor was strongly estrogen and progesterone receptor positive, HER-2 negative.   3. She completed radiation in June 2011, at which point she started tamoxifen.    PLAN: Jeannemarie is doing well as far as her breast cancer is concerned. She is now 6 years out from her definitive surgery with no evidence of recurrent disease. The labs were reviewed in detail and were stable. She is scheduled for her annual mammogram at the end of this month. She is tolerating the tamoxifen well and will continue this drug for 10 years of antiestrogen therapy.   Jniyah will return in 1  year for labs and a follow up visit with Dr. Jana Hakim. She understands and agrees with this plan. She knows the goal of treatment in her case is cure. She has  been encouraged to call with any issues tha tmighta rise before her next visit here.    Laurie Panda, NP   07/12/2015 3:27 PM

## 2015-07-23 ENCOUNTER — Ambulatory Visit: Payer: BLUE CROSS/BLUE SHIELD

## 2015-07-23 ENCOUNTER — Ambulatory Visit
Admission: RE | Admit: 2015-07-23 | Discharge: 2015-07-23 | Disposition: A | Discharge status: Home / Self Care | Payer: BLUE CROSS/BLUE SHIELD | Source: Ambulatory Visit | Attending: Family Medicine | Admitting: Family Medicine

## 2015-07-23 ENCOUNTER — Ambulatory Visit
Admission: RE | Admit: 2015-07-23 | Discharge: 2015-07-23 | Disposition: A | Discharge status: Home / Self Care | Payer: BLUE CROSS/BLUE SHIELD | Source: Ambulatory Visit

## 2015-07-23 ENCOUNTER — Other Ambulatory Visit: Payer: Self-pay | Admitting: Family Medicine

## 2015-07-23 DIAGNOSIS — R922 Inconclusive mammogram: Secondary | ICD-10-CM | POA: Diagnosis not present

## 2015-07-23 DIAGNOSIS — Z853 Personal history of malignant neoplasm of breast: Secondary | ICD-10-CM

## 2015-07-23 DIAGNOSIS — Z1231 Encounter for screening mammogram for malignant neoplasm of breast: Secondary | ICD-10-CM

## 2015-08-04 ENCOUNTER — Other Ambulatory Visit: Payer: Self-pay | Admitting: Oncology

## 2015-09-03 DIAGNOSIS — Z940 Kidney transplant status: Secondary | ICD-10-CM | POA: Diagnosis not present

## 2015-09-03 DIAGNOSIS — E1022 Type 1 diabetes mellitus with diabetic chronic kidney disease: Secondary | ICD-10-CM | POA: Diagnosis not present

## 2015-09-03 DIAGNOSIS — E038 Other specified hypothyroidism: Secondary | ICD-10-CM | POA: Diagnosis not present

## 2015-09-03 DIAGNOSIS — D631 Anemia in chronic kidney disease: Secondary | ICD-10-CM | POA: Diagnosis not present

## 2015-09-03 DIAGNOSIS — E559 Vitamin D deficiency, unspecified: Secondary | ICD-10-CM | POA: Diagnosis not present

## 2015-09-03 DIAGNOSIS — E782 Mixed hyperlipidemia: Secondary | ICD-10-CM | POA: Diagnosis not present

## 2015-09-06 DIAGNOSIS — E1022 Type 1 diabetes mellitus with diabetic chronic kidney disease: Secondary | ICD-10-CM | POA: Diagnosis not present

## 2015-09-06 DIAGNOSIS — Z940 Kidney transplant status: Secondary | ICD-10-CM | POA: Diagnosis not present

## 2015-09-06 DIAGNOSIS — I10 Essential (primary) hypertension: Secondary | ICD-10-CM | POA: Diagnosis not present

## 2015-09-06 DIAGNOSIS — E782 Mixed hyperlipidemia: Secondary | ICD-10-CM | POA: Diagnosis not present

## 2015-11-05 DIAGNOSIS — E119 Type 2 diabetes mellitus without complications: Secondary | ICD-10-CM | POA: Diagnosis not present

## 2015-11-05 DIAGNOSIS — Z961 Presence of intraocular lens: Secondary | ICD-10-CM | POA: Diagnosis not present

## 2015-12-06 DIAGNOSIS — E038 Other specified hypothyroidism: Secondary | ICD-10-CM | POA: Diagnosis not present

## 2015-12-06 DIAGNOSIS — E782 Mixed hyperlipidemia: Secondary | ICD-10-CM | POA: Diagnosis not present

## 2015-12-06 DIAGNOSIS — Z940 Kidney transplant status: Secondary | ICD-10-CM | POA: Diagnosis not present

## 2015-12-06 DIAGNOSIS — D631 Anemia in chronic kidney disease: Secondary | ICD-10-CM | POA: Diagnosis not present

## 2015-12-06 DIAGNOSIS — E559 Vitamin D deficiency, unspecified: Secondary | ICD-10-CM | POA: Diagnosis not present

## 2015-12-06 DIAGNOSIS — E1022 Type 1 diabetes mellitus with diabetic chronic kidney disease: Secondary | ICD-10-CM | POA: Diagnosis not present

## 2015-12-11 DIAGNOSIS — E782 Mixed hyperlipidemia: Secondary | ICD-10-CM | POA: Diagnosis not present

## 2015-12-11 DIAGNOSIS — Z940 Kidney transplant status: Secondary | ICD-10-CM | POA: Diagnosis not present

## 2015-12-11 DIAGNOSIS — I10 Essential (primary) hypertension: Secondary | ICD-10-CM | POA: Diagnosis not present

## 2015-12-11 DIAGNOSIS — E1022 Type 1 diabetes mellitus with diabetic chronic kidney disease: Secondary | ICD-10-CM | POA: Diagnosis not present

## 2015-12-17 DIAGNOSIS — Z940 Kidney transplant status: Secondary | ICD-10-CM | POA: Diagnosis not present

## 2015-12-17 DIAGNOSIS — I10 Essential (primary) hypertension: Secondary | ICD-10-CM | POA: Diagnosis not present

## 2015-12-17 DIAGNOSIS — D899 Disorder involving the immune mechanism, unspecified: Secondary | ICD-10-CM | POA: Diagnosis not present

## 2015-12-17 DIAGNOSIS — Z524 Kidney donor: Secondary | ICD-10-CM | POA: Diagnosis not present

## 2016-01-03 DIAGNOSIS — L821 Other seborrheic keratosis: Secondary | ICD-10-CM | POA: Diagnosis not present

## 2016-01-03 DIAGNOSIS — D485 Neoplasm of uncertain behavior of skin: Secondary | ICD-10-CM | POA: Diagnosis not present

## 2016-03-07 DIAGNOSIS — Z940 Kidney transplant status: Secondary | ICD-10-CM | POA: Diagnosis not present

## 2016-03-07 DIAGNOSIS — E1129 Type 2 diabetes mellitus with other diabetic kidney complication: Secondary | ICD-10-CM | POA: Diagnosis not present

## 2016-03-10 DIAGNOSIS — E782 Mixed hyperlipidemia: Secondary | ICD-10-CM | POA: Diagnosis not present

## 2016-03-10 DIAGNOSIS — Z940 Kidney transplant status: Secondary | ICD-10-CM | POA: Diagnosis not present

## 2016-03-10 DIAGNOSIS — E1122 Type 2 diabetes mellitus with diabetic chronic kidney disease: Secondary | ICD-10-CM | POA: Diagnosis not present

## 2016-03-10 DIAGNOSIS — I129 Hypertensive chronic kidney disease with stage 1 through stage 4 chronic kidney disease, or unspecified chronic kidney disease: Secondary | ICD-10-CM | POA: Diagnosis not present

## 2016-06-02 DIAGNOSIS — Z940 Kidney transplant status: Secondary | ICD-10-CM | POA: Diagnosis not present

## 2016-06-02 DIAGNOSIS — E559 Vitamin D deficiency, unspecified: Secondary | ICD-10-CM | POA: Diagnosis not present

## 2016-06-02 DIAGNOSIS — E782 Mixed hyperlipidemia: Secondary | ICD-10-CM | POA: Diagnosis not present

## 2016-06-02 DIAGNOSIS — E1022 Type 1 diabetes mellitus with diabetic chronic kidney disease: Secondary | ICD-10-CM | POA: Diagnosis not present

## 2016-06-02 DIAGNOSIS — D631 Anemia in chronic kidney disease: Secondary | ICD-10-CM | POA: Diagnosis not present

## 2016-06-02 DIAGNOSIS — E790 Hyperuricemia without signs of inflammatory arthritis and tophaceous disease: Secondary | ICD-10-CM | POA: Diagnosis not present

## 2016-06-02 DIAGNOSIS — E039 Hypothyroidism, unspecified: Secondary | ICD-10-CM | POA: Diagnosis not present

## 2016-06-02 DIAGNOSIS — N183 Chronic kidney disease, stage 3 (moderate): Secondary | ICD-10-CM | POA: Diagnosis not present

## 2016-06-05 DIAGNOSIS — E1022 Type 1 diabetes mellitus with diabetic chronic kidney disease: Secondary | ICD-10-CM | POA: Diagnosis not present

## 2016-06-05 DIAGNOSIS — E782 Mixed hyperlipidemia: Secondary | ICD-10-CM | POA: Diagnosis not present

## 2016-06-05 DIAGNOSIS — Z940 Kidney transplant status: Secondary | ICD-10-CM | POA: Diagnosis not present

## 2016-06-05 DIAGNOSIS — I129 Hypertensive chronic kidney disease with stage 1 through stage 4 chronic kidney disease, or unspecified chronic kidney disease: Secondary | ICD-10-CM | POA: Diagnosis not present

## 2016-06-10 DIAGNOSIS — Z940 Kidney transplant status: Secondary | ICD-10-CM | POA: Diagnosis not present

## 2016-06-10 DIAGNOSIS — D899 Disorder involving the immune mechanism, unspecified: Secondary | ICD-10-CM | POA: Diagnosis not present

## 2016-06-10 DIAGNOSIS — I10 Essential (primary) hypertension: Secondary | ICD-10-CM | POA: Diagnosis not present

## 2016-06-10 DIAGNOSIS — Z524 Kidney donor: Secondary | ICD-10-CM | POA: Diagnosis not present

## 2016-06-20 ENCOUNTER — Other Ambulatory Visit: Payer: Self-pay | Admitting: Family Medicine

## 2016-06-20 DIAGNOSIS — Z853 Personal history of malignant neoplasm of breast: Secondary | ICD-10-CM

## 2016-07-10 ENCOUNTER — Ambulatory Visit: Payer: BLUE CROSS/BLUE SHIELD | Admitting: Oncology

## 2016-07-10 ENCOUNTER — Other Ambulatory Visit: Payer: BLUE CROSS/BLUE SHIELD

## 2016-07-25 ENCOUNTER — Ambulatory Visit
Admission: RE | Admit: 2016-07-25 | Discharge: 2016-07-25 | Disposition: A | Discharge status: Home / Self Care | Payer: BLUE CROSS/BLUE SHIELD | Source: Ambulatory Visit | Attending: Family Medicine | Admitting: Family Medicine

## 2016-07-25 DIAGNOSIS — Z853 Personal history of malignant neoplasm of breast: Secondary | ICD-10-CM

## 2016-07-25 DIAGNOSIS — R922 Inconclusive mammogram: Secondary | ICD-10-CM | POA: Diagnosis not present

## 2016-07-29 ENCOUNTER — Other Ambulatory Visit: Payer: Self-pay

## 2016-07-29 DIAGNOSIS — C50112 Malignant neoplasm of central portion of left female breast: Secondary | ICD-10-CM

## 2016-07-31 ENCOUNTER — Ambulatory Visit (HOSPITAL_BASED_OUTPATIENT_CLINIC_OR_DEPARTMENT_OTHER): Payer: BLUE CROSS/BLUE SHIELD | Admitting: Adult Health

## 2016-07-31 ENCOUNTER — Other Ambulatory Visit (HOSPITAL_BASED_OUTPATIENT_CLINIC_OR_DEPARTMENT_OTHER): Payer: BLUE CROSS/BLUE SHIELD

## 2016-07-31 VITALS — BP 139/82 | HR 83 | Temp 98.4°F | Resp 20 | Ht 62.0 in | Wt 118.7 lb

## 2016-07-31 DIAGNOSIS — Z170 Estrogen receptor positive status [ER+]: Secondary | ICD-10-CM | POA: Diagnosis not present

## 2016-07-31 DIAGNOSIS — C50112 Malignant neoplasm of central portion of left female breast: Secondary | ICD-10-CM

## 2016-07-31 DIAGNOSIS — Z1239 Encounter for other screening for malignant neoplasm of breast: Secondary | ICD-10-CM

## 2016-07-31 DIAGNOSIS — Z853 Personal history of malignant neoplasm of breast: Secondary | ICD-10-CM

## 2016-07-31 DIAGNOSIS — Z940 Kidney transplant status: Secondary | ICD-10-CM

## 2016-07-31 LAB — COMPREHENSIVE METABOLIC PANEL
ALBUMIN: 3.6 g/dL (ref 3.5–5.0)
ALK PHOS: 78 U/L (ref 40–150)
ALT: 19 U/L (ref 0–55)
AST: 17 U/L (ref 5–34)
Anion Gap: 6 mEq/L (ref 3–11)
BUN: 20.1 mg/dL (ref 7.0–26.0)
CALCIUM: 9.1 mg/dL (ref 8.4–10.4)
CO2: 28 mEq/L (ref 22–29)
CREATININE: 1.1 mg/dL (ref 0.6–1.1)
Chloride: 104 mEq/L (ref 98–109)
EGFR: 55 mL/min/{1.73_m2} — ABNORMAL LOW (ref >90)
Glucose: 94 mg/dl (ref 70–140)
Potassium: 5 mEq/L (ref 3.5–5.1)
Sodium: 138 mEq/L (ref 136–145)
Total Bilirubin: 0.32 mg/dL (ref 0.20–1.20)
Total Protein: 5.7 g/dL — ABNORMAL LOW (ref 6.4–8.3)

## 2016-07-31 LAB — CBC WITH DIFFERENTIAL/PLATELET
BASO%: 0 % (ref 0.0–2.0)
BASOS ABS: 0 10*3/uL (ref 0.0–0.1)
EOS%: 0.1 % (ref 0.0–7.0)
Eosinophils Absolute: 0 10*3/uL (ref 0.0–0.5)
HEMATOCRIT: 33.3 % — AB (ref 34.8–46.6)
HGB: 10.8 g/dL — ABNORMAL LOW (ref 11.6–15.9)
LYMPH#: 0.8 10*3/uL — AB (ref 0.9–3.3)
LYMPH%: 10.7 % — ABNORMAL LOW (ref 14.0–49.7)
MCH: 29.1 pg (ref 25.1–34.0)
MCHC: 32.4 g/dL (ref 31.5–36.0)
MCV: 89.8 fL (ref 79.5–101.0)
MONO#: 0.3 10*3/uL (ref 0.1–0.9)
MONO%: 4.1 % (ref 0.0–14.0)
NEUT%: 85.1 % — ABNORMAL HIGH (ref 38.4–76.8)
NEUTROS ABS: 6.2 10*3/uL (ref 1.5–6.5)
Platelets: 198 10*3/uL (ref 145–400)
RBC: 3.71 10*6/uL (ref 3.70–5.45)
RDW: 13.5 % (ref 11.2–14.5)
WBC: 7.3 10*3/uL (ref 3.9–10.3)

## 2016-07-31 MED ORDER — TAMOXIFEN CITRATE 20 MG PO TABS: 20.0000 mg | ORAL_TABLET | Freq: Every day | ORAL | 4 refills | Status: DC

## 2016-07-31 NOTE — Progress Notes (Signed)
[place]  Telephone:(336) [phone] Fax:(336) [phone]     ID: Vanessa Vanessa [name] OB: October 11, 1960  MR#: [id number]  FXT#:[id number]  PCP: Vanessa Dus, MD GYN:   SU: Vanessa Messing, MD [place]:  Vanessa Silversmith, MD OTHER MD: Vanessa Prose, MD;  Vanessa Skeens, MD;  Vanessa Grippe, MD  CHIEF COMPLAINT:  Hx left Breast Cancer  CURRENT TREATMENT: Tamoxifen  BREAST CANCER HISTORY: From Vanessa Vanessa [name] original office note, dated 05/02/2009:  Vanessa Vanessa [name] (pronounced March-e-soda) had routine mammography 01/17/2009 which suggested a possible mass in the left breast.  She was recalled for diagnostic mammography and ultrasonography 02/13/2009.This showed very dense breasts but spot compression views confirmed a mass-like density in the upper central portion of the left breast, measuring up to 2.1 cm.  This was not palpable by Dr. Owens Vanessa [name].  An ultrasound showed only normal appearing fibroglandular tissue.    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  The tumor was strongly estrogen receptor positive at 98%, progesterone receptor positive at 99%, with a borderline high proliferation fraction at 23%, and Her-2 negative by CISH with a ratio of 1.36.    With this information, the patient was referred to Vanessa Vanessa [name], and bilateral breast MRIs, and she was set up for breast-specific gamma imaging at [facility] (the patient was not able to have an MRI because of prior surgeries) on February 23, 2009.  This showed intense focal increased activity at 11 o'clock in the left breast, corresponding with the prior biopsy site.  There was also patchy increased activity through both breasts, most prominently in the upper outer quadrant bilaterally.  Repeat ultrasound of both upper outer quadrants and biopsies were suggested.  These were performed on February 7th at [facility]  and Vanessa Vanessa [name] found multiple hypoechoic nodularities bilaterally.  In particular, in the upper outer quadrant of the right breast, there was a hypoechoic nodule at 11 o'clock and in the left breast, a similar nodule at 3 o'clock.  These two areas were biopsied ([id number]) and showed only fibrocytic changes.    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  No evidence of lymphovascular invasion and 0 of 6 lymph nodes involved.  (These 6 lymph nodes were hot and had blue dye per Vanessa Vanessa [name] description).  The patient accordingly stages as a T1b N0 MX.    Subsequent treatment history is as follows.   INTERVAL HISTORY: Vanessa Vanessa [name] returns today for follow-up of her early stage estrogen receptor positive breast cancer. She is doing well today.  She continues to take Tamoxifen daily and is tolerating it well.  She denies any issues today and is without any questions or concerns.    REVIEW OF SYSTEMS: A detailed ROS was conducted and is non contributory  PAST MEDICAL HISTORY: Past Medical History:  Diagnosis Date  . Anemia in chronic kidney disease(285.21) 03/16/2013  . Brain aneurysm 03/16/2013   Age 24 (1983)  . H/O kidney transplant 03/16/2013  . History of left breast cancer 03/16/2013  . Hx gestational diabetes 03/16/2013  . Hx of primary hypertension 03/16/2013    PAST SURGICAL HISTORY: No past surgical history on file.  FAMILY HISTORY:  (Updated February 2015)  Family History  Problem Relation Age of Onset  . Brain cancer Mother   . Diabetes type II Father   . Parkinson's disease Father    The patient's father is alive at age 29.  He has diabetes and Parkinson's disease. The patient's mother is 83.  She apparently had a glioblastoma removed and treated  at [facility] in September of 2009.  This recurred in February 2014, but she is currenty doing well.  Again, her mother was the donor for Vanessa Vanessa [name]'s kidney.  The patient has her twin sister who is doing very well and a younger sister.  There is no history of breast or ovarian cancer in the family.    GYNECOLOGIC HISTORY: (Updated February 2015)  She is GX P2, first pregnancy to term at age 89.   Her periods are irregular, LMP March 2014.   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 Her husband of 30+ years, Vanessa Vanessa [name] (goes by Vanessa Vanessa [name]) is self-employed in maintenance although currently unemployed.  Their 58 year old Vanessa [name], Vanessa Vanessa [name], works full-time and is also in school.  Daughter Vanessa Vanessa [name], 27, goes to [employer].  The patient attends a friend's church.    ADVANCED DIRECTIVES: Not in place; at her 04/18/2014 visit the patient was given the appropriate documents 2 complete and notarize at her discretion   HEALTH MAINTENANCE:  (Updated February 2015) Social History  Substance Use Topics  . Smoking status: Former Smoker    Packs/day: 0.50    Years: 2.00    Types: Cigarettes  . Smokeless tobacco: Never Used  . Alcohol use No     Colonoscopy:  2005/Dr. Wynetta Emery (was due again in 2010)  PAP:  Sept 2012/Dr. Sun  Bone density:  May 2011, Normal   Lipid panel:  UTD, Dr. Alyson Ingles   Allergies  Allergen Reactions  . Codeine Other (See Comments)    Unknown    Current Outpatient Prescriptions  Medication Sig Dispense Refill  . acetaminophen (TYLENOL) 500 MG tablet Take 1,000 mg by mouth every 6 (six) hours as needed for moderate pain.    Marland Kitchen amoxicillin (AMOXIL) 500 MG capsule Reported on 07/12/2015  3  . atenolol (TENORMIN) 25 MG tablet     . colchicine 0.6 MG tablet Take 2 now and 1 in 1 hour.  May repeat dose once daily.  For gout attack. (Patient taking differently: Take 0.6 mg by mouth daily as needed (for flareup). Take 2 now and 1 in 1  hour.  May repeat dose once daily.  For gout attack.) 12 tablet 0  . CVS VITAMIN B12 1000 MCG tablet Take 2,000 mcg by mouth daily.  12  . furosemide (LASIX) 20 MG tablet Take 20 mg by mouth daily as needed for fluid or edema. Reported on 07/12/2015    . mycophenolate (CELLCEPT) 500 MG tablet Take 500-1,000 mg by mouth 2 (two) times daily. Takes 2 tabs in am and 1 tab in pm    . predniSONE (DELTASONE) 5 MG tablet Take 1 tablet (5 mg total) by mouth daily with breakfast. 90 tablet 4  . SYNTHROID 137 MCG tablet Take 137 mcg by mouth daily before breakfast.     . tacrolimus (PROGRAF) 1 MG capsule Take 1 mg by mouth 2 (two) times daily.     . tamoxifen (NOLVADEX) 20 MG tablet Take 1 tablet (20 mg total) by mouth daily. 90 tablet 4  . ULORIC 40 MG tablet Take 40 mg by mouth daily.     Marland Kitchen  Vitamin D, Ergocalciferol, (DRISDOL) 50000 units CAPS capsule Take 1 capsule by mouth every Sunday.  0   No current facility-administered medications for this visit.     OBJECTIVE:   Vitals:   07/31/16 1506  BP: 139/82  Pulse: 83  Resp: 20  Temp: 98.4 F (36.9 C)     Body mass index is 21.71 kg/m.    ECOG FS: 0 Filed Weights   07/31/16 1506  Weight: 118 lb 11.2 oz (53.8 kg)   GENERAL: Patient is a well appearing female in no acute distress HEENT:  Sclerae anicteric.  Oropharynx clear and moist. No ulcerations or evidence of oropharyngeal candidiasis. Neck is supple.  NODES:  No cervical, supraclavicular, or axillary lymphadenopathy palpated.  BREAST EXAM:  Left breast s/p lumpectomy, scar well healed, no nodularity, no skin/nipple changes, right breast without any nodules, masses, skin or nipple changes.  Benign bilateral breast exam.   LUNGS:  Clear to auscultation bilaterally.  No wheezes or rhonchi. HEART:  Regular rate and rhythm. No murmur appreciated. ABDOMEN:  Soft, nontender.  Positive, normoactive bowel sounds. No organomegaly palpated. MSK:  No focal spinal tenderness to palpation. Full range of  motion bilaterally in the upper extremities. EXTREMITIES:  No peripheral edema.   SKIN:  Clear with no obvious rashes or skin changes. No nail dyscrasia. NEURO:  Nonfocal. Well oriented.  Appropriate affect.    LAB RESULTS:    Lab Results  Component Value Date   WBC 7.3 07/31/2016   NEUTROABS 6.2 07/31/2016   HGB 10.8 (L) 07/31/2016   HCT 33.3 (L) 07/31/2016   MCV 89.8 07/31/2016   PLT 198 07/31/2016      Chemistry      Component Value Date/Time   NA 138 07/31/2016 1410   K 5.0 07/31/2016 1410   CL 109 04/23/2015 0452   CO2 28 07/31/2016 1410   BUN 20.1 07/31/2016 1410   CREATININE 1.1 07/31/2016 1410      Component Value Date/Time   CALCIUM 9.1 07/31/2016 1410   ALKPHOS 78 07/31/2016 1410   AST 17 07/31/2016 1410   ALT 19 07/31/2016 1410   BILITOT 0.32 07/31/2016 1410      STUDIES: Mm Diag Breast Tomo Bilateral  Result Date: 07/25/2016 CLINICAL DATA:  Malignant lumpectomy of the upper central left breast in March, 2011, pathology invasive duct carcinoma and DCIS. Patient is currently undergoing hormonal chemoprevention with tamoxifen.Annual evaluation. EXAM: 2D DIGITAL DIAGNOSTIC BILATERAL MAMMOGRAM WITH CAD AND ADJUNCT TOMO COMPARISON:  07/23/2015, 07/18/2014 and earlier. ACR Breast Density Category c: The breast tissue is heterogeneously dense, which may obscure small masses. FINDINGS: Standard 2D and tomosynthesis full field CC and MLO views of both breasts were obtained. A standard spot magnification MLO view of the lumpectomy site in the left breast was also obtained. Post lumpectomy scar/architectural distortion with associated dystrophic calcification at the lumpectomy site in the upper left breast at posterior depth. No new or suspicious findings in the left breast. No findings suspicious for malignancy in the right breast. Mammographic images were processed with CAD. IMPRESSION: No mammographic evidence of malignancy involving either breast. Expected post  lumpectomy changes in the left breast. RECOMMENDATION: As the patient is now 7 years out from her lumpectomy, she may return to annual screening mammography. Screening mammogram in one year is recommended.(Code:SM-B-01Y) I have discussed the findings and recommendations with the patient. Results were also provided in writing at the conclusion of the visit. If applicable, a reminder letter will be sent to the  patient regarding the next appointment. BI-RADS CATEGORY  2: Benign. Electronically Signed   By: Evangeline Dakin M.D.   On: 07/25/2016 16:30    ASSESSMENT: 56 y.o. Milus Glazier woman: 1. Remote History of Renal transplant, with persistent anemia followed by her nephrologist, Dr. Joelyn Oms. Receives Aranesp injections as needed. 2. Status post left lumpectomy and sentinel lymph node sampling March 2011 for a T1b N0 grade 1 invasive ductal carcinoma, with an MIB - 1 of 23%.  The tumor was strongly estrogen and progesterone receptor positive, HER-2 negative.   3. She completed radiation in June 2011, at which point she started tamoxifen.    PLAN: Jamylah is doing well today.  Her mammogram results above from 07/25/2016.  Her exam is normal, she is tolerating Tamoxifen well.  She has no clinical or radiographic sign of recurrence.  She will undergo bilateral screening mammogram next year.  She will continue taking Tamoxifen.  We reviewed healthy diet and exercise recommendations.  She will return in one year.  She verbalized understanding and knows to call for any questions or concerns prior to her next appt here.    A total of (30) minutes of face-to-face time was spent with this patient with greater than 50% of that time in counseling and care-coordination.    Scot Dock, NP   08/06/2016 3:24 PM

## 2016-08-06 ENCOUNTER — Encounter: Payer: Self-pay | Admitting: Adult Health

## 2016-09-08 DIAGNOSIS — N183 Chronic kidney disease, stage 3 (moderate): Secondary | ICD-10-CM | POA: Diagnosis not present

## 2016-09-08 DIAGNOSIS — E1022 Type 1 diabetes mellitus with diabetic chronic kidney disease: Secondary | ICD-10-CM | POA: Diagnosis not present

## 2016-09-08 DIAGNOSIS — Z940 Kidney transplant status: Secondary | ICD-10-CM | POA: Diagnosis not present

## 2016-09-08 DIAGNOSIS — E559 Vitamin D deficiency, unspecified: Secondary | ICD-10-CM | POA: Diagnosis not present

## 2016-09-08 DIAGNOSIS — E790 Hyperuricemia without signs of inflammatory arthritis and tophaceous disease: Secondary | ICD-10-CM | POA: Diagnosis not present

## 2016-09-08 DIAGNOSIS — E782 Mixed hyperlipidemia: Secondary | ICD-10-CM | POA: Diagnosis not present

## 2016-09-08 DIAGNOSIS — D631 Anemia in chronic kidney disease: Secondary | ICD-10-CM | POA: Diagnosis not present

## 2016-09-08 DIAGNOSIS — E039 Hypothyroidism, unspecified: Secondary | ICD-10-CM | POA: Diagnosis not present

## 2016-09-12 DIAGNOSIS — E1022 Type 1 diabetes mellitus with diabetic chronic kidney disease: Secondary | ICD-10-CM | POA: Diagnosis not present

## 2016-09-12 DIAGNOSIS — Z940 Kidney transplant status: Secondary | ICD-10-CM | POA: Diagnosis not present

## 2016-09-12 DIAGNOSIS — E782 Mixed hyperlipidemia: Secondary | ICD-10-CM | POA: Diagnosis not present

## 2016-09-12 DIAGNOSIS — I129 Hypertensive chronic kidney disease with stage 1 through stage 4 chronic kidney disease, or unspecified chronic kidney disease: Secondary | ICD-10-CM | POA: Diagnosis not present

## 2016-12-09 DIAGNOSIS — E790 Hyperuricemia without signs of inflammatory arthritis and tophaceous disease: Secondary | ICD-10-CM | POA: Diagnosis not present

## 2016-12-09 DIAGNOSIS — E559 Vitamin D deficiency, unspecified: Secondary | ICD-10-CM | POA: Diagnosis not present

## 2016-12-09 DIAGNOSIS — Z940 Kidney transplant status: Secondary | ICD-10-CM | POA: Diagnosis not present

## 2016-12-09 DIAGNOSIS — N183 Chronic kidney disease, stage 3 (moderate): Secondary | ICD-10-CM | POA: Diagnosis not present

## 2016-12-09 DIAGNOSIS — E782 Mixed hyperlipidemia: Secondary | ICD-10-CM | POA: Diagnosis not present

## 2016-12-09 DIAGNOSIS — E039 Hypothyroidism, unspecified: Secondary | ICD-10-CM | POA: Diagnosis not present

## 2016-12-09 DIAGNOSIS — E1022 Type 1 diabetes mellitus with diabetic chronic kidney disease: Secondary | ICD-10-CM | POA: Diagnosis not present

## 2016-12-09 DIAGNOSIS — D631 Anemia in chronic kidney disease: Secondary | ICD-10-CM | POA: Diagnosis not present

## 2016-12-11 DIAGNOSIS — Z961 Presence of intraocular lens: Secondary | ICD-10-CM | POA: Diagnosis not present

## 2016-12-11 DIAGNOSIS — E119 Type 2 diabetes mellitus without complications: Secondary | ICD-10-CM | POA: Diagnosis not present

## 2016-12-12 DIAGNOSIS — E1022 Type 1 diabetes mellitus with diabetic chronic kidney disease: Secondary | ICD-10-CM | POA: Diagnosis not present

## 2016-12-12 DIAGNOSIS — Z833 Family history of diabetes mellitus: Secondary | ICD-10-CM | POA: Diagnosis not present

## 2016-12-12 DIAGNOSIS — Z841 Family history of disorders of kidney and ureter: Secondary | ICD-10-CM | POA: Diagnosis not present

## 2016-12-12 DIAGNOSIS — E782 Mixed hyperlipidemia: Secondary | ICD-10-CM | POA: Diagnosis not present

## 2016-12-12 DIAGNOSIS — I129 Hypertensive chronic kidney disease with stage 1 through stage 4 chronic kidney disease, or unspecified chronic kidney disease: Secondary | ICD-10-CM | POA: Diagnosis not present

## 2016-12-12 DIAGNOSIS — Z940 Kidney transplant status: Secondary | ICD-10-CM | POA: Diagnosis not present

## 2017-01-01 DIAGNOSIS — I10 Essential (primary) hypertension: Secondary | ICD-10-CM | POA: Diagnosis not present

## 2017-01-01 DIAGNOSIS — Z940 Kidney transplant status: Secondary | ICD-10-CM | POA: Diagnosis not present

## 2017-01-01 DIAGNOSIS — D899 Disorder involving the immune mechanism, unspecified: Secondary | ICD-10-CM | POA: Diagnosis not present

## 2017-01-01 DIAGNOSIS — Z524 Kidney donor: Secondary | ICD-10-CM | POA: Diagnosis not present

## 2017-03-09 DIAGNOSIS — E039 Hypothyroidism, unspecified: Secondary | ICD-10-CM | POA: Diagnosis not present

## 2017-03-09 DIAGNOSIS — D631 Anemia in chronic kidney disease: Secondary | ICD-10-CM | POA: Diagnosis not present

## 2017-03-09 DIAGNOSIS — N183 Chronic kidney disease, stage 3 (moderate): Secondary | ICD-10-CM | POA: Diagnosis not present

## 2017-03-09 DIAGNOSIS — E782 Mixed hyperlipidemia: Secondary | ICD-10-CM | POA: Diagnosis not present

## 2017-03-09 DIAGNOSIS — Z940 Kidney transplant status: Secondary | ICD-10-CM | POA: Diagnosis not present

## 2017-03-09 DIAGNOSIS — E790 Hyperuricemia without signs of inflammatory arthritis and tophaceous disease: Secondary | ICD-10-CM | POA: Diagnosis not present

## 2017-03-09 DIAGNOSIS — E559 Vitamin D deficiency, unspecified: Secondary | ICD-10-CM | POA: Diagnosis not present

## 2017-03-09 DIAGNOSIS — E1022 Type 1 diabetes mellitus with diabetic chronic kidney disease: Secondary | ICD-10-CM | POA: Diagnosis not present

## 2017-03-13 DIAGNOSIS — E1122 Type 2 diabetes mellitus with diabetic chronic kidney disease: Secondary | ICD-10-CM | POA: Diagnosis not present

## 2017-03-13 DIAGNOSIS — E782 Mixed hyperlipidemia: Secondary | ICD-10-CM | POA: Diagnosis not present

## 2017-03-13 DIAGNOSIS — Z940 Kidney transplant status: Secondary | ICD-10-CM | POA: Diagnosis not present

## 2017-03-13 DIAGNOSIS — I129 Hypertensive chronic kidney disease with stage 1 through stage 4 chronic kidney disease, or unspecified chronic kidney disease: Secondary | ICD-10-CM | POA: Diagnosis not present

## 2017-06-08 DIAGNOSIS — E782 Mixed hyperlipidemia: Secondary | ICD-10-CM | POA: Diagnosis not present

## 2017-06-08 DIAGNOSIS — Z940 Kidney transplant status: Secondary | ICD-10-CM | POA: Diagnosis not present

## 2017-06-08 DIAGNOSIS — E790 Hyperuricemia without signs of inflammatory arthritis and tophaceous disease: Secondary | ICD-10-CM | POA: Diagnosis not present

## 2017-06-08 DIAGNOSIS — D631 Anemia in chronic kidney disease: Secondary | ICD-10-CM | POA: Diagnosis not present

## 2017-06-08 DIAGNOSIS — E039 Hypothyroidism, unspecified: Secondary | ICD-10-CM | POA: Diagnosis not present

## 2017-06-08 DIAGNOSIS — E1022 Type 1 diabetes mellitus with diabetic chronic kidney disease: Secondary | ICD-10-CM | POA: Diagnosis not present

## 2017-06-08 DIAGNOSIS — N183 Chronic kidney disease, stage 3 (moderate): Secondary | ICD-10-CM | POA: Diagnosis not present

## 2017-06-08 DIAGNOSIS — E559 Vitamin D deficiency, unspecified: Secondary | ICD-10-CM | POA: Diagnosis not present

## 2017-06-12 DIAGNOSIS — E1122 Type 2 diabetes mellitus with diabetic chronic kidney disease: Secondary | ICD-10-CM | POA: Diagnosis not present

## 2017-06-12 DIAGNOSIS — N183 Chronic kidney disease, stage 3 (moderate): Secondary | ICD-10-CM | POA: Diagnosis not present

## 2017-06-12 DIAGNOSIS — I129 Hypertensive chronic kidney disease with stage 1 through stage 4 chronic kidney disease, or unspecified chronic kidney disease: Secondary | ICD-10-CM | POA: Diagnosis not present

## 2017-06-12 DIAGNOSIS — Z940 Kidney transplant status: Secondary | ICD-10-CM | POA: Diagnosis not present

## 2017-06-17 ENCOUNTER — Telehealth: Payer: Self-pay | Admitting: Adult Health

## 2017-06-17 NOTE — Telephone Encounter (Signed)
Called pt, left message.  Rescheduled lab and f/u appt from 7/5 to 7/8.  Lab 2 pm; LC 2:30.  Mailed calendar.

## 2017-07-03 DIAGNOSIS — Z940 Kidney transplant status: Secondary | ICD-10-CM | POA: Diagnosis not present

## 2017-07-27 ENCOUNTER — Ambulatory Visit: Payer: BLUE CROSS/BLUE SHIELD

## 2017-07-29 ENCOUNTER — Ambulatory Visit: Payer: BLUE CROSS/BLUE SHIELD

## 2017-07-31 ENCOUNTER — Ambulatory Visit: Payer: BLUE CROSS/BLUE SHIELD | Admitting: Adult Health

## 2017-07-31 ENCOUNTER — Other Ambulatory Visit: Payer: BLUE CROSS/BLUE SHIELD

## 2017-08-03 ENCOUNTER — Inpatient Hospital Stay: Payer: BLUE CROSS/BLUE SHIELD | Admitting: Adult Health

## 2017-08-03 ENCOUNTER — Inpatient Hospital Stay: Payer: BLUE CROSS/BLUE SHIELD | Attending: Oncology

## 2017-08-03 ENCOUNTER — Ambulatory Visit
Admission: RE | Admit: 2017-08-03 | Discharge: 2017-08-03 | Disposition: A | Discharge status: Home / Self Care | Payer: BLUE CROSS/BLUE SHIELD | Source: Ambulatory Visit | Attending: Adult Health | Admitting: Adult Health

## 2017-08-03 DIAGNOSIS — Z1231 Encounter for screening mammogram for malignant neoplasm of breast: Secondary | ICD-10-CM | POA: Diagnosis not present

## 2017-08-03 DIAGNOSIS — Z1239 Encounter for other screening for malignant neoplasm of breast: Secondary | ICD-10-CM

## 2017-08-28 DIAGNOSIS — D899 Disorder involving the immune mechanism, unspecified: Secondary | ICD-10-CM | POA: Diagnosis not present

## 2017-08-28 DIAGNOSIS — Z940 Kidney transplant status: Secondary | ICD-10-CM | POA: Diagnosis not present

## 2017-08-28 DIAGNOSIS — Z524 Kidney donor: Secondary | ICD-10-CM | POA: Diagnosis not present

## 2017-08-28 DIAGNOSIS — I10 Essential (primary) hypertension: Secondary | ICD-10-CM | POA: Diagnosis not present

## 2017-09-07 DIAGNOSIS — D631 Anemia in chronic kidney disease: Secondary | ICD-10-CM | POA: Diagnosis not present

## 2017-09-07 DIAGNOSIS — Z940 Kidney transplant status: Secondary | ICD-10-CM | POA: Diagnosis not present

## 2017-09-07 DIAGNOSIS — E790 Hyperuricemia without signs of inflammatory arthritis and tophaceous disease: Secondary | ICD-10-CM | POA: Diagnosis not present

## 2017-09-07 DIAGNOSIS — E559 Vitamin D deficiency, unspecified: Secondary | ICD-10-CM | POA: Diagnosis not present

## 2017-09-07 DIAGNOSIS — N183 Chronic kidney disease, stage 3 (moderate): Secondary | ICD-10-CM | POA: Diagnosis not present

## 2017-09-07 DIAGNOSIS — E782 Mixed hyperlipidemia: Secondary | ICD-10-CM | POA: Diagnosis not present

## 2017-09-07 DIAGNOSIS — E119 Type 2 diabetes mellitus without complications: Secondary | ICD-10-CM | POA: Diagnosis not present

## 2017-09-07 DIAGNOSIS — E042 Nontoxic multinodular goiter: Secondary | ICD-10-CM | POA: Diagnosis not present

## 2017-09-09 ENCOUNTER — Other Ambulatory Visit: Payer: Self-pay | Admitting: Adult Health

## 2017-09-09 DIAGNOSIS — C50112 Malignant neoplasm of central portion of left female breast: Secondary | ICD-10-CM

## 2017-09-09 DIAGNOSIS — Z170 Estrogen receptor positive status [ER+]: Principal | ICD-10-CM

## 2017-09-09 NOTE — Telephone Encounter (Signed)
Needs appointment for further refills

## 2017-09-11 DIAGNOSIS — E119 Type 2 diabetes mellitus without complications: Secondary | ICD-10-CM | POA: Diagnosis not present

## 2017-09-11 DIAGNOSIS — I129 Hypertensive chronic kidney disease with stage 1 through stage 4 chronic kidney disease, or unspecified chronic kidney disease: Secondary | ICD-10-CM | POA: Diagnosis not present

## 2017-09-11 DIAGNOSIS — Z940 Kidney transplant status: Secondary | ICD-10-CM | POA: Diagnosis not present

## 2017-09-11 DIAGNOSIS — N183 Chronic kidney disease, stage 3 (moderate): Secondary | ICD-10-CM | POA: Diagnosis not present

## 2017-12-11 DIAGNOSIS — E042 Nontoxic multinodular goiter: Secondary | ICD-10-CM | POA: Diagnosis not present

## 2017-12-11 DIAGNOSIS — N183 Chronic kidney disease, stage 3 (moderate): Secondary | ICD-10-CM | POA: Diagnosis not present

## 2017-12-11 DIAGNOSIS — E559 Vitamin D deficiency, unspecified: Secondary | ICD-10-CM | POA: Diagnosis not present

## 2017-12-11 DIAGNOSIS — E790 Hyperuricemia without signs of inflammatory arthritis and tophaceous disease: Secondary | ICD-10-CM | POA: Diagnosis not present

## 2017-12-11 DIAGNOSIS — D631 Anemia in chronic kidney disease: Secondary | ICD-10-CM | POA: Diagnosis not present

## 2017-12-11 DIAGNOSIS — E782 Mixed hyperlipidemia: Secondary | ICD-10-CM | POA: Diagnosis not present

## 2017-12-11 DIAGNOSIS — E119 Type 2 diabetes mellitus without complications: Secondary | ICD-10-CM | POA: Diagnosis not present

## 2017-12-11 DIAGNOSIS — Z940 Kidney transplant status: Secondary | ICD-10-CM | POA: Diagnosis not present

## 2017-12-14 DIAGNOSIS — E782 Mixed hyperlipidemia: Secondary | ICD-10-CM | POA: Diagnosis not present

## 2017-12-14 DIAGNOSIS — E119 Type 2 diabetes mellitus without complications: Secondary | ICD-10-CM | POA: Diagnosis not present

## 2017-12-14 DIAGNOSIS — Z940 Kidney transplant status: Secondary | ICD-10-CM | POA: Diagnosis not present

## 2017-12-14 DIAGNOSIS — N183 Chronic kidney disease, stage 3 (moderate): Secondary | ICD-10-CM | POA: Diagnosis not present

## 2017-12-19 ENCOUNTER — Other Ambulatory Visit: Payer: Self-pay | Admitting: Adult Health

## 2017-12-19 DIAGNOSIS — Z170 Estrogen receptor positive status [ER+]: Principal | ICD-10-CM

## 2017-12-19 DIAGNOSIS — C50112 Malignant neoplasm of central portion of left female breast: Secondary | ICD-10-CM

## 2017-12-21 ENCOUNTER — Other Ambulatory Visit: Payer: Self-pay | Admitting: Adult Health

## 2017-12-21 DIAGNOSIS — C50112 Malignant neoplasm of central portion of left female breast: Secondary | ICD-10-CM

## 2017-12-21 DIAGNOSIS — Z170 Estrogen receptor positive status [ER+]: Principal | ICD-10-CM

## 2017-12-21 MED ORDER — TAMOXIFEN CITRATE 20 MG PO TABS
20.0000 mg | ORAL_TABLET | Freq: Every day | ORAL | 0 refills | Status: DC
Start: 2017-12-21 — End: 2019-04-15

## 2017-12-21 NOTE — Telephone Encounter (Signed)
Vanessa [name], please send in 30 days and call patient to reschedule her missed appointment with [place].

## 2018-03-12 DIAGNOSIS — Z940 Kidney transplant status: Secondary | ICD-10-CM | POA: Diagnosis not present

## 2018-03-12 DIAGNOSIS — E782 Mixed hyperlipidemia: Secondary | ICD-10-CM | POA: Diagnosis not present

## 2018-03-12 DIAGNOSIS — E039 Hypothyroidism, unspecified: Secondary | ICD-10-CM | POA: Diagnosis not present

## 2018-03-12 DIAGNOSIS — E559 Vitamin D deficiency, unspecified: Secondary | ICD-10-CM | POA: Diagnosis not present

## 2018-03-12 DIAGNOSIS — D631 Anemia in chronic kidney disease: Secondary | ICD-10-CM | POA: Diagnosis not present

## 2018-03-12 DIAGNOSIS — E790 Hyperuricemia without signs of inflammatory arthritis and tophaceous disease: Secondary | ICD-10-CM | POA: Diagnosis not present

## 2018-03-12 DIAGNOSIS — N183 Chronic kidney disease, stage 3 (moderate): Secondary | ICD-10-CM | POA: Diagnosis not present

## 2018-03-12 DIAGNOSIS — E119 Type 2 diabetes mellitus without complications: Secondary | ICD-10-CM | POA: Diagnosis not present

## 2018-03-16 DIAGNOSIS — Z940 Kidney transplant status: Secondary | ICD-10-CM | POA: Diagnosis not present

## 2018-03-16 DIAGNOSIS — I129 Hypertensive chronic kidney disease with stage 1 through stage 4 chronic kidney disease, or unspecified chronic kidney disease: Secondary | ICD-10-CM | POA: Diagnosis not present

## 2018-03-16 DIAGNOSIS — E1122 Type 2 diabetes mellitus with diabetic chronic kidney disease: Secondary | ICD-10-CM | POA: Diagnosis not present

## 2018-03-16 DIAGNOSIS — E782 Mixed hyperlipidemia: Secondary | ICD-10-CM | POA: Diagnosis not present

## 2018-03-25 DIAGNOSIS — Z940 Kidney transplant status: Secondary | ICD-10-CM | POA: Diagnosis not present

## 2018-04-23 DIAGNOSIS — Z940 Kidney transplant status: Secondary | ICD-10-CM | POA: Diagnosis not present

## 2018-04-23 DIAGNOSIS — I10 Essential (primary) hypertension: Secondary | ICD-10-CM | POA: Diagnosis not present

## 2018-04-23 DIAGNOSIS — N189 Chronic kidney disease, unspecified: Secondary | ICD-10-CM | POA: Diagnosis not present

## 2018-04-23 DIAGNOSIS — D631 Anemia in chronic kidney disease: Secondary | ICD-10-CM | POA: Diagnosis not present

## 2018-07-09 ENCOUNTER — Other Ambulatory Visit: Payer: Self-pay | Admitting: Family Medicine

## 2018-07-09 DIAGNOSIS — Z1231 Encounter for screening mammogram for malignant neoplasm of breast: Secondary | ICD-10-CM

## 2018-07-12 DIAGNOSIS — E042 Nontoxic multinodular goiter: Secondary | ICD-10-CM | POA: Diagnosis not present

## 2018-07-12 DIAGNOSIS — E790 Hyperuricemia without signs of inflammatory arthritis and tophaceous disease: Secondary | ICD-10-CM | POA: Diagnosis not present

## 2018-07-12 DIAGNOSIS — E782 Mixed hyperlipidemia: Secondary | ICD-10-CM | POA: Diagnosis not present

## 2018-07-12 DIAGNOSIS — Z940 Kidney transplant status: Secondary | ICD-10-CM | POA: Diagnosis not present

## 2018-07-12 DIAGNOSIS — E119 Type 2 diabetes mellitus without complications: Secondary | ICD-10-CM | POA: Diagnosis not present

## 2018-07-12 DIAGNOSIS — E039 Hypothyroidism, unspecified: Secondary | ICD-10-CM | POA: Diagnosis not present

## 2018-07-12 DIAGNOSIS — E559 Vitamin D deficiency, unspecified: Secondary | ICD-10-CM | POA: Diagnosis not present

## 2018-07-12 DIAGNOSIS — D631 Anemia in chronic kidney disease: Secondary | ICD-10-CM | POA: Diagnosis not present

## 2018-07-12 DIAGNOSIS — N183 Chronic kidney disease, stage 3 (moderate): Secondary | ICD-10-CM | POA: Diagnosis not present

## 2018-07-15 DIAGNOSIS — N183 Chronic kidney disease, stage 3 (moderate): Secondary | ICD-10-CM | POA: Diagnosis not present

## 2018-07-15 DIAGNOSIS — Z940 Kidney transplant status: Secondary | ICD-10-CM | POA: Diagnosis not present

## 2018-07-15 DIAGNOSIS — E119 Type 2 diabetes mellitus without complications: Secondary | ICD-10-CM | POA: Diagnosis not present

## 2018-07-15 DIAGNOSIS — I129 Hypertensive chronic kidney disease with stage 1 through stage 4 chronic kidney disease, or unspecified chronic kidney disease: Secondary | ICD-10-CM | POA: Diagnosis not present

## 2018-08-26 ENCOUNTER — Ambulatory Visit
Admission: RE | Admit: 2018-08-26 | Discharge: 2018-08-26 | Disposition: A | Discharge status: Home / Self Care | Payer: BLUE CROSS/BLUE SHIELD | Source: Ambulatory Visit | Attending: Family Medicine | Admitting: Family Medicine

## 2018-08-26 ENCOUNTER — Other Ambulatory Visit: Payer: Self-pay

## 2018-08-26 DIAGNOSIS — Z1231 Encounter for screening mammogram for malignant neoplasm of breast: Secondary | ICD-10-CM | POA: Diagnosis not present

## 2018-10-11 DIAGNOSIS — N183 Chronic kidney disease, stage 3 (moderate): Secondary | ICD-10-CM | POA: Diagnosis not present

## 2018-10-11 DIAGNOSIS — E039 Hypothyroidism, unspecified: Secondary | ICD-10-CM | POA: Diagnosis not present

## 2018-10-11 DIAGNOSIS — D631 Anemia in chronic kidney disease: Secondary | ICD-10-CM | POA: Diagnosis not present

## 2018-10-11 DIAGNOSIS — E790 Hyperuricemia without signs of inflammatory arthritis and tophaceous disease: Secondary | ICD-10-CM | POA: Diagnosis not present

## 2018-10-11 DIAGNOSIS — E782 Mixed hyperlipidemia: Secondary | ICD-10-CM | POA: Diagnosis not present

## 2018-10-11 DIAGNOSIS — E119 Type 2 diabetes mellitus without complications: Secondary | ICD-10-CM | POA: Diagnosis not present

## 2018-10-11 DIAGNOSIS — Z940 Kidney transplant status: Secondary | ICD-10-CM | POA: Diagnosis not present

## 2018-10-11 DIAGNOSIS — E559 Vitamin D deficiency, unspecified: Secondary | ICD-10-CM | POA: Diagnosis not present

## 2018-10-14 DIAGNOSIS — I10 Essential (primary) hypertension: Secondary | ICD-10-CM | POA: Diagnosis not present

## 2018-10-14 DIAGNOSIS — E119 Type 2 diabetes mellitus without complications: Secondary | ICD-10-CM | POA: Diagnosis not present

## 2018-10-14 DIAGNOSIS — E782 Mixed hyperlipidemia: Secondary | ICD-10-CM | POA: Diagnosis not present

## 2018-10-14 DIAGNOSIS — Z940 Kidney transplant status: Secondary | ICD-10-CM | POA: Diagnosis not present

## 2018-11-08 DIAGNOSIS — E119 Type 2 diabetes mellitus without complications: Secondary | ICD-10-CM | POA: Diagnosis not present

## 2018-11-08 DIAGNOSIS — H524 Presbyopia: Secondary | ICD-10-CM | POA: Diagnosis not present

## 2018-11-08 DIAGNOSIS — Z961 Presence of intraocular lens: Secondary | ICD-10-CM | POA: Diagnosis not present

## 2018-11-17 DIAGNOSIS — Z23 Encounter for immunization: Secondary | ICD-10-CM | POA: Diagnosis not present

## 2018-12-20 ENCOUNTER — Other Ambulatory Visit: Payer: Self-pay

## 2018-12-20 ENCOUNTER — Encounter (HOSPITAL_COMMUNITY): Payer: Self-pay | Admitting: Emergency Medicine

## 2018-12-20 ENCOUNTER — Emergency Department (HOSPITAL_COMMUNITY)
Admission: EM | Admit: 2018-12-20 | Discharge: 2018-12-20 | Disposition: A | Discharge status: Home / Self Care | Payer: BC Managed Care – PPO | Attending: Emergency Medicine | Admitting: Emergency Medicine

## 2018-12-20 DIAGNOSIS — Z03818 Encounter for observation for suspected exposure to other biological agents ruled out: Secondary | ICD-10-CM | POA: Diagnosis not present

## 2018-12-20 DIAGNOSIS — Z87891 Personal history of nicotine dependence: Secondary | ICD-10-CM | POA: Diagnosis not present

## 2018-12-20 DIAGNOSIS — R197 Diarrhea, unspecified: Secondary | ICD-10-CM | POA: Insufficient documentation

## 2018-12-20 DIAGNOSIS — Z940 Kidney transplant status: Secondary | ICD-10-CM | POA: Insufficient documentation

## 2018-12-20 DIAGNOSIS — Z20828 Contact with and (suspected) exposure to other viral communicable diseases: Secondary | ICD-10-CM | POA: Diagnosis not present

## 2018-12-20 DIAGNOSIS — Z79899 Other long term (current) drug therapy: Secondary | ICD-10-CM | POA: Insufficient documentation

## 2018-12-20 DIAGNOSIS — R634 Abnormal weight loss: Secondary | ICD-10-CM | POA: Insufficient documentation

## 2018-12-20 DIAGNOSIS — I129 Hypertensive chronic kidney disease with stage 1 through stage 4 chronic kidney disease, or unspecified chronic kidney disease: Secondary | ICD-10-CM | POA: Diagnosis not present

## 2018-12-20 DIAGNOSIS — N3000 Acute cystitis without hematuria: Secondary | ICD-10-CM | POA: Diagnosis not present

## 2018-12-20 DIAGNOSIS — R109 Unspecified abdominal pain: Secondary | ICD-10-CM | POA: Diagnosis not present

## 2018-12-20 DIAGNOSIS — N189 Chronic kidney disease, unspecified: Secondary | ICD-10-CM | POA: Diagnosis not present

## 2018-12-20 DIAGNOSIS — R1110 Vomiting, unspecified: Secondary | ICD-10-CM | POA: Diagnosis not present

## 2018-12-20 LAB — URINALYSIS, ROUTINE W REFLEX MICROSCOPIC
Bilirubin Urine: NEGATIVE
Glucose, UA: NEGATIVE mg/dL
Ketones, ur: 80 mg/dL — AB
Leukocytes,Ua: NEGATIVE
Nitrite: NEGATIVE
Protein, ur: 100 mg/dL — AB
RBC / HPF: >50 RBC/hpf — ABNORMAL HIGH (ref 0–5)
Specific Gravity, Urine: 1.017 (ref 1.005–1.030)
pH: 6 (ref 5.0–8.0)

## 2018-12-20 LAB — CBC
HCT: 32.7 % — ABNORMAL LOW (ref 36.0–46.0)
Hemoglobin: 10 g/dL — ABNORMAL LOW (ref 12.0–15.0)
MCH: 28.5 pg (ref 26.0–34.0)
MCHC: 30.6 g/dL (ref 30.0–36.0)
MCV: 93.2 fL (ref 80.0–100.0)
Platelets: 278 10*3/uL (ref 150–400)
RBC: 3.51 MIL/uL — ABNORMAL LOW (ref 3.87–5.11)
RDW: 16.4 % — ABNORMAL HIGH (ref 11.5–15.5)
WBC: 6.9 10*3/uL (ref 4.0–10.5)
nRBC: 0 % (ref 0.0–0.2)

## 2018-12-20 LAB — COMPREHENSIVE METABOLIC PANEL
ALT: 16 U/L (ref 0–44)
AST: 13 U/L — ABNORMAL LOW (ref 15–41)
Albumin: 3.1 g/dL — ABNORMAL LOW (ref 3.5–5.0)
Alkaline Phosphatase: 64 U/L (ref 38–126)
Anion gap: 7 (ref 5–15)
BUN: 18 mg/dL (ref 6–20)
CO2: 17 mmol/L — ABNORMAL LOW (ref 22–32)
Calcium: 7 mg/dL — ABNORMAL LOW (ref 8.9–10.3)
Chloride: 117 mmol/L — ABNORMAL HIGH (ref 98–111)
Creatinine, Ser: 1.36 mg/dL — ABNORMAL HIGH (ref 0.44–1.00)
GFR calc Af Amer: 49 mL/min — ABNORMAL LOW (ref >60)
GFR calc non Af Amer: 42 mL/min — ABNORMAL LOW (ref >60)
Glucose, Bld: 95 mg/dL (ref 70–99)
Potassium: 3.1 mmol/L — ABNORMAL LOW (ref 3.5–5.1)
Sodium: 141 mmol/L (ref 135–145)
Total Bilirubin: 0.3 mg/dL (ref 0.3–1.2)
Total Protein: 5.2 g/dL — ABNORMAL LOW (ref 6.5–8.1)

## 2018-12-20 LAB — LIPASE, BLOOD: Lipase: 63 U/L — ABNORMAL HIGH (ref 11–51)

## 2018-12-20 MED ORDER — SODIUM CHLORIDE 0.9 % IV BOLUS
1000.0000 mL | Freq: Once | INTRAVENOUS | Status: AC
  Administered 2018-12-20: 19:00:00 1000 mL via INTRAVENOUS

## 2018-12-20 MED ORDER — POTASSIUM CHLORIDE CRYS ER 20 MEQ PO TBCR
40.0000 meq | EXTENDED_RELEASE_TABLET | Freq: Once | ORAL | Status: AC
  Administered 2018-12-20: 40 meq via ORAL
  Filled 2018-12-20: qty 2

## 2018-12-20 MED ORDER — ONDANSETRON 4 MG PO TBDP: 4.0000 mg | ORAL_TABLET | Freq: Three times a day (TID) | ORAL | 0 refills | Status: DC | PRN

## 2018-12-20 MED ORDER — SODIUM CHLORIDE 0.9% FLUSH: 3.0000 mL | Freq: Once | INTRAVENOUS | Status: DC

## 2018-12-20 MED ORDER — CEPHALEXIN 250 MG PO CAPS: 250.0000 mg | ORAL_CAPSULE | Freq: Four times a day (QID) | ORAL | 0 refills | Status: AC

## 2018-12-20 NOTE — ED Provider Notes (Signed)
[facility] EMERGENCY DEPARTMENT Provider Note   CSN: [id number] Arrival date & time: 12/20/18  1210     History   Chief Complaint Chief Complaint  Patient presents with  . Abdominal Pain  . Emesis  . Diarrhea  . Weight Loss    HPI Vanessa [name] is a 58 y.o. female with PMH significant for HTN, kidney transplant, CKD, and history of breast cancer who presents to the ED with 4-day history of generalized abdominal discomfort worse after eating as well as nausea, vomiting, loose stools, and a 10 pound weight loss.  She endorses a 25-month history of progressively worsening early satiety and associated nausea symptoms, her last colonoscopy was approximately 10 years ago and she understands that she is overdue and is trying to arrange for the procedure.  She reports that approximately 4 days ago she consumed Janine Limbo immediately prior to work and "made a mess on herself" and has since had profuse loose stools and generalized abdominal discomfort. She reports that she has been taking Imodium frequently, with some effect.  Patient also endorses occasional mild dysuria.  She denies any fevers or chills, headache or dizziness, chest pain or shortness of breath, cough, bloody stools, vaginal discharge, or vaginal bleeding.     HPI  Past Medical History:  Diagnosis Date  . Anemia in chronic kidney disease(285.21) 03/16/2013  . Brain aneurysm 03/16/2013   Age 46 (1983)  . H/O kidney transplant 03/16/2013  . History of left breast cancer 03/16/2013  . Hx gestational diabetes 03/16/2013  . Hx of primary hypertension 03/16/2013    Patient Active Problem List   Diagnosis Date Noted  . Cellulitis 04/21/2015  . Hypertension 04/21/2015  . Hyponatremia 04/21/2015  . Infected wound 04/21/2015  . Breast cancer, left breast ([place]) 04/18/2014  . Anemia in neoplastic disease 04/18/2014  . History of left breast cancer 03/16/2013  . Anemia in chronic renal disease 03/16/2013  .  H/O kidney transplant 03/16/2013  . Brain aneurysm 03/16/2013  . Hx of primary hypertension 03/16/2013  . Hx gestational diabetes 03/16/2013    Past Surgical History:  Procedure Laterality Date  . BREAST EXCISIONAL BIOPSY Left      OB History   No obstetric history on file.      Home Medications    Prior to Admission medications   Medication Sig Start Date End Date Taking? Authorizing Provider  acetaminophen (TYLENOL) 500 MG tablet Take 1,000 mg by mouth every 6 (six) hours as needed for moderate pain.    [provider]  amoxicillin (AMOXIL) 500 MG capsule Reported on 07/12/2015 05/22/15   [provider]  atenolol (TENORMIN) 25 MG tablet  06/20/15   [provider]  cephALEXin (KEFLEX) 250 MG capsule Take 1 capsule (250 mg total) by mouth 4 (four) times daily for 5 days. 12/20/18 12/25/18  Corena Herter, PA-C  colchicine 0.6 MG tablet Take 2 now and 1 in 1 hour.  May repeat dose once daily.  For gout attack. Patient taking differently: Take 0.6 mg by mouth daily as needed (for flareup). Take 2 now and 1 in 1 hour.  May repeat dose once daily.  For gout attack. 02/08/12   Harden Mo, MD  [facility] VITAMIN B12 1000 MCG tablet Take 2,000 mcg by mouth daily. 04/03/15   [provider]  furosemide (LASIX) 20 MG tablet Take 20 mg by mouth daily as needed for fluid or edema. Reported on 07/12/2015    [provider]  mycophenolate (CELLCEPT) 500 MG tablet Take 500-1,000 mg by mouth 2 (two) times daily. Takes 2 tabs in am and 1 tab in pm 02/21/13   [provider]  ondansetron (ZOFRAN ODT) 4 MG disintegrating tablet Take 1 tablet (4 mg total) by mouth every 8 (eight) hours as needed for nausea or vomiting. 12/20/18   Corena Herter, PA-C  predniSONE (DELTASONE) 5 MG tablet Take 1 tablet (5 mg total) by mouth daily with breakfast. 04/18/14   Magrinat, Virgie Dad, MD  SYNTHROID 137 MCG tablet Take 137 mcg by mouth daily before breakfast.   01/25/13   [provider]  tacrolimus (PROGRAF) 1 MG capsule Take 1 mg by mouth 2 (two) times daily.  02/27/13   [provider]  tamoxifen (NOLVADEX) 20 MG tablet TAKE 1 TABLET DAILY 03/11/18   Gardenia Phlegm, NP  tamoxifen (NOLVADEX) 20 MG tablet Take 1 tablet (20 mg total) by mouth daily. 12/21/17   Gardenia Phlegm, NP  ULORIC 40 MG tablet Take 40 mg by mouth daily.  02/18/13   [provider]  Vitamin D, Ergocalciferol, (DRISDOL) 50000 units CAPS capsule Take 1 capsule by mouth every Sunday. 03/29/15   [provider]    Family History Family History  Problem Relation Age of Onset  . Brain cancer Mother   . Diabetes type II Father   . Parkinson's disease Father     Social History Social History   Tobacco Use  . Smoking status: Former Smoker    Packs/day: 0.50    Years: 2.00    Pack years: 1.00    Types: Cigarettes  . Smokeless tobacco: Never Used  Substance Use Topics  . Alcohol use: No  . Drug use: No     Allergies   Codeine   Review of Systems Review of Systems   Physical Exam Updated Vital Signs BP 118/74   Pulse 67   Temp 97.7 F (36.5 C) (Oral)   Resp (!) 22   Ht 5\' 2"  (1.575 m)   Wt 43.1 kg   LMP 01/16/2012   SpO2 100%   BMI 17.38 kg/m   Physical Exam Vitals signs and nursing note reviewed. Exam conducted with a chaperone present.  Constitutional:      Appearance: Normal appearance.  HENT:     Head: Normocephalic and atraumatic.     Nose: Nose normal.     Mouth/Throat:     Pharynx: Oropharynx is clear.  Eyes:     General: No scleral icterus.    Conjunctiva/sclera: Conjunctivae normal.  Neck:     Musculoskeletal: Normal range of motion.  Cardiovascular:     Rate and Rhythm: Normal rate and regular rhythm.     Pulses: Normal pulses.     Heart sounds: Normal heart sounds.  Pulmonary:     Effort: Pulmonary effort is normal. No respiratory distress.     Breath sounds: Normal breath sounds.   Abdominal:     Comments: Soft, nondistended.  No significant focal TTP.  No overlying skin changes.  Multiple scars from prior abdominal surgeries.  No guarding.  Skin:    General: Skin is dry.  Neurological:     Mental Status: She is alert.     GCS: GCS eye subscore is 4. GCS verbal subscore is 5. GCS motor subscore is 6.  Psychiatric:        Mood and Affect: Mood normal.        Behavior: Behavior normal.  Thought Content: Thought content normal.      ED Treatments / Results  Labs (all labs ordered are listed, but only abnormal results are displayed) Labs Reviewed  LIPASE, BLOOD - Abnormal; Notable for the following components:      Result Value   Lipase 63 (*)    All other components within normal limits  COMPREHENSIVE METABOLIC PANEL - Abnormal; Notable for the following components:   Potassium 3.1 (*)    Chloride 117 (*)    CO2 17 (*)    Creatinine, Ser 1.36 (*)    Calcium 7.0 (*)    Total Protein 5.2 (*)    Albumin 3.1 (*)    AST 13 (*)    GFR calc non Af Amer 42 (*)    GFR calc Af Amer 49 (*)    All other components within normal limits  CBC - Abnormal; Notable for the following components:   RBC 3.51 (*)    Hemoglobin 10.0 (*)    HCT 32.7 (*)    RDW 16.4 (*)    All other components within normal limits  URINALYSIS, ROUTINE W REFLEX MICROSCOPIC - Abnormal; Notable for the following components:   Color, Urine AMBER (*)    APPearance CLOUDY (*)    Hgb urine dipstick LARGE (*)    Ketones, ur 80 (*)    Protein, ur 100 (*)    RBC / HPF >50 (*)    Bacteria, UA MANY (*)    All other components within normal limits  I-STAT BETA HCG BLOOD, ED ([facility], [facility], [facility] ONLY)    EKG None  Radiology No results found.  Procedures Procedures (including critical care time)  Medications Ordered in ED Medications  sodium chloride flush (NS) 0.9 % injection 3 mL (has no administration in time range)  sodium chloride 0.9 % bolus 1,000 mL (1,000 mLs Intravenous New  Bag/Given 12/20/18 1855)  potassium chloride SA (KLOR-CON) CR tablet 40 mEq (40 mEq Oral Given 12/20/18 1857)     Initial Impression / Assessment and Plan / ED Course  I have reviewed the triage vital signs and the nursing notes.  Pertinent labs & imaging results that were available during my care of the patient were reviewed by me and considered in my medical decision making (see chart for details).  Clinical Course as of Dec 19 2000  Mon Dec 20, 2018  1824 Will replenish with 40 mEq K-Dur given nausea controlled.  Potassium(!): 3.1 [GG]    Clinical Course User Index [GG] Corena Herter, PA-C       Her history and physical exam is consistent with an enteritis, likely viral etiology subsequent to Janine Limbo consumption 4 days ago.  She denies any bloody stools, concern for bacterial etiology at this time.  She is her generalized abdominal discomfort, particularly lower abdomen, may also be partially attributable to her urinary tract infection.  UA was interpreted and demonstrated many bacteria, leukocytes, cloudy appearance, and ketones was also support her obvious dehydration.  Will prescribe short course of Keflex for UTI.  Patient denies any flank discomfort, fevers, chills, body aches and her CBC did not demonstrate leukocytosis.  I have low suspicion for pyelonephritis.  Patient states that she is followed by [facility] physicians and that her last creatinine in September was 0.95 and she denies any abnormal renal function tests since her kidney transplant many years ago.  She likely has prerenal azotemia due to diminished p.o. intake.  We will resuscitate with 2 L  NS in the ED.  Discussed case with Dr. Wilson Singer and given her benign abdominal exam, do not feel as though CT abdomen and pelvis is warranted to evaluate for obstruction or other intraabdominal pathology.  Her vital signs are within normal limits and she is in no acute distress.  Her lipase is mildly positive, I have no prior  value with which to compare and she had no focal epigastric TTP.   She also reports that her place of employment has seen numerous COVID-19 positive employees and COVID-19 testing is required given her illness before she can return to work.  Will obtain COVID-19 testing prior to discharge.  Once again, suspect viral enteritis secondary to Janine Limbo with superimposed UTI.  Her AKI is likely secondary to diminished fluid hydration and significant loose stools and vomiting.  Emphasized the importance of oral hydration.  All of the evaluation and work-up results were discussed with the patient and any family at bedside. They were provided opportunity to ask any additional questions and have none at this time. They have expressed understanding of verbal discharge instructions as well as return precautions and are agreeable to the plan.     Final Clinical Impressions(s) / ED Diagnoses   Final diagnoses:  Acute cystitis without hematuria    ED Discharge Orders         Ordered    ondansetron (ZOFRAN ODT) 4 MG disintegrating tablet  Every 8 hours PRN     12/20/18 2000    cephALEXin (KEFLEX) 250 MG capsule  4 times daily     12/20/18 2002           Reita Chard 12/20/18 2011    Virgel Manifold, MD 12/20/18 2213

## 2018-12-20 NOTE — Discharge Instructions (Signed)
Please follow-up for repeat blood work in the next 3 to 5 days pending results of COVID-19 testing.  You will need to have your renal function and electrolytes reassessed.  It is important that you get established with a gastroenterologist for your scheduled colonoscopy.  I have put instructions to follow-up with the on-call gastroenterologist.  I cannot emphasize the importance of increased oral hydration.  Please be sure to replace fluids lost.  I prescribed Zofran ODT to take as needed for nausea symptoms.  Please continue to use Imodium, as needed.

## 2018-12-20 NOTE — ED Triage Notes (Signed)
Onset 5 days ago stated lost about 10 pounds and having general abdominal pain 2/10 sore after eating and have been having nausea, emesis, and diarrhea.

## 2018-12-22 ENCOUNTER — Telehealth (HOSPITAL_COMMUNITY): Payer: Self-pay

## 2018-12-22 DIAGNOSIS — R109 Unspecified abdominal pain: Secondary | ICD-10-CM | POA: Diagnosis not present

## 2018-12-22 DIAGNOSIS — Z940 Kidney transplant status: Secondary | ICD-10-CM | POA: Diagnosis not present

## 2018-12-22 DIAGNOSIS — Z86010 Personal history of colonic polyps: Secondary | ICD-10-CM | POA: Diagnosis not present

## 2018-12-22 DIAGNOSIS — N390 Urinary tract infection, site not specified: Secondary | ICD-10-CM | POA: Diagnosis not present

## 2018-12-22 LAB — NOVEL CORONAVIRUS, NAA (HOSP ORDER, SEND-OUT TO REF LAB; TAT 18-24 HRS): SARS-CoV-2, NAA: NOT DETECTED

## 2019-01-25 DIAGNOSIS — Z1211 Encounter for screening for malignant neoplasm of colon: Secondary | ICD-10-CM | POA: Diagnosis not present

## 2019-01-25 DIAGNOSIS — K648 Other hemorrhoids: Secondary | ICD-10-CM | POA: Diagnosis not present

## 2019-01-26 ENCOUNTER — Ambulatory Visit
Admission: EM | Admit: 2019-01-26 | Discharge: 2019-01-26 | Disposition: A | Discharge status: Home / Self Care | Payer: BC Managed Care – PPO

## 2019-01-26 ENCOUNTER — Ambulatory Visit: Payer: BC Managed Care – PPO

## 2019-01-26 DIAGNOSIS — S60511A Abrasion of right hand, initial encounter: Secondary | ICD-10-CM

## 2019-01-26 DIAGNOSIS — S61451A Open bite of right hand, initial encounter: Secondary | ICD-10-CM

## 2019-01-26 DIAGNOSIS — W540XXA Bitten by dog, initial encounter: Secondary | ICD-10-CM

## 2019-01-26 MED ORDER — AMOXICILLIN-POT CLAVULANATE 875-125 MG PO TABS: 1.0000 | ORAL_TABLET | Freq: Two times a day (BID) | ORAL | 0 refills | Status: AC

## 2019-01-26 MED ORDER — ACETAMINOPHEN ER 650 MG PO TBCR: 650.0000 mg | EXTENDED_RELEASE_TABLET | Freq: Three times a day (TID) | ORAL | 0 refills | Status: DC | PRN

## 2019-01-26 NOTE — Discharge Instructions (Signed)
Steri-strips applied Bandage applied Keep covered for next and dry for next 24-48 hours.  After then you may gently clean with warm water and mild soap.  Avoid submerging wound in water. Change dressing daily and apply a thin layer of neosporin.  Tylenol prescribed.  Take as needed for pain Augmentin prescribed.  Take as directed to help prevent infection Follow up with PCP as needed Return sooner or go to the ED if you have any new or worsening symptoms such as increased pain, redness, swelling, drainage, discharge, decreased range of motion of extremity, etc..

## 2019-01-26 NOTE — ED Provider Notes (Addendum)
[facility]   [id number] 01/26/19 Arrival Time: 87  CC: Dog bite  SUBJECTIVE:  Vanessa [name] is a 58 y.o. female who presents with dog bite to RT hand that occurred today.  Symptoms began after her dog bit her RT hand.  Bandage applied.  Bleeding controlled.  Currently not on blood thinners.  Denies similar symptoms in the past.  Denies fever, chills, nausea, vomiting, redness, swelling, purulent drainage, decrease strength or sensation.   Td UTD: Yes. Rabies up-to-date  ROS: As per HPI.  All other pertinent ROS negative.     Past Medical History:  Diagnosis Date  . Anemia in chronic kidney disease(285.21) 03/16/2013  . Brain aneurysm 03/16/2013   Age 88 (1983)  . H/O kidney transplant 03/16/2013  . History of left breast cancer 03/16/2013  . Hx gestational diabetes 03/16/2013  . Hx of primary hypertension 03/16/2013   Past Surgical History:  Procedure Laterality Date  . BREAST EXCISIONAL BIOPSY Left    Allergies  Allergen Reactions  . Codeine Other (See Comments)    Unknown   No current facility-administered medications on file prior to encounter.   Current Outpatient Medications on File Prior to Encounter  Medication Sig Dispense Refill  . acetaminophen (TYLENOL) 500 MG tablet Take 1,000 mg by mouth every 6 (six) hours as needed for moderate pain.    Marland Kitchen amoxicillin (AMOXIL) 500 MG capsule Reported on 07/12/2015  3  . atenolol (TENORMIN) 25 MG tablet     . calcitRIOL (ROCALTROL) 0.25 MCG capsule Take 0.25 mcg by mouth daily.    . colchicine 0.6 MG tablet Take 2 now and 1 in 1 hour.  May repeat dose once daily.  For gout attack. (Patient taking differently: Take 0.6 mg by mouth daily as needed (for flareup). Take 2 now and 1 in 1 hour.  May repeat dose once daily.  For gout attack.) 12 tablet 0  . [facility]S VITAMIN B12 1000 MCG tablet Take 2,000 mcg by mouth daily.  12  . furosemide (LASIX) 20 MG tablet Take 20 mg by mouth daily as needed for fluid or edema.  Reported on 07/12/2015    . mycophenolate (CELLCEPT) 500 MG tablet Take 500-1,000 mg by mouth 2 (two) times daily. Takes 2 tabs in am and 1 tab in pm    . ondansetron (ZOFRAN ODT) 4 MG disintegrating tablet Take 1 tablet (4 mg total) by mouth every 8 (eight) hours as needed for nausea or vomiting. 20 tablet 0  . SYNTHROID 137 MCG tablet Take 137 mcg by mouth daily before breakfast.     . tacrolimus (PROGRAF) 1 MG capsule Take 1 mg by mouth 2 (two) times daily.     . tamoxifen (NOLVADEX) 20 MG tablet TAKE 1 TABLET DAILY 30 tablet 12  . tamoxifen (NOLVADEX) 20 MG tablet Take 1 tablet (20 mg total) by mouth daily. 30 tablet 0  . ULORIC 40 MG tablet Take 40 mg by mouth daily.     . Vitamin D, Ergocalciferol, (DRISDOL) 50000 units CAPS capsule Take 1 capsule by mouth every Sunday.  0   Social History   Socioeconomic History  . Marital status: Married    Spouse name: Not on file  . Number of children: Not on file  . Years of education: Not on file  . Highest education level: Not on file  Occupational History  . Not on file  Tobacco Use  . Smoking status: Former Smoker    Packs/day: 0.50  Years: 2.00    Pack years: 1.00    Types: Cigarettes  . Smokeless tobacco: Never Used  Substance and Sexual Activity  . Alcohol use: No  . Drug use: No  . Sexual activity: Yes    Birth control/protection: Condom  Other Topics Concern  . Not on file  Social History Narrative  . Not on file   Social Determinants of Health   Financial Resource Strain:   . Difficulty of Paying Living Expenses: Not on file  Food Insecurity:   . Worried About Charity fundraiser in the Last Year: Not on file  . Ran Out of Food in the Last Year: Not on file  Transportation Needs:   . Lack of Transportation (Medical): Not on file  . Lack of Transportation (Non-Medical): Not on file  Physical Activity:   . Days of Exercise per Week: Not on file  . Minutes of Exercise per Session: Not on file  Stress:   . Feeling  of Stress : Not on file  Social Connections:   . Frequency of Communication with Friends and Family: Not on file  . Frequency of Social Gatherings with Friends and Family: Not on file  . Attends Religious Services: Not on file  . Active Member of Clubs or Organizations: Not on file  . Attends Archivist Meetings: Not on file  . Marital Status: Not on file  Intimate Partner Violence:   . Fear of Current or Ex-Partner: Not on file  . Emotionally Abused: Not on file  . Physically Abused: Not on file  . Sexually Abused: Not on file   Family History  Problem Relation Age of Onset  . Brain cancer Mother   . Diabetes type II Father   . Parkinson's disease Father      OBJECTIVE:  Vitals:   01/26/19 1438  BP: (!) 141/84  Pulse: 68  Resp: 17  Temp: (!) 97.4 F (36.3 C)  TempSrc: Oral  SpO2: 96%     General appearance: alert; no distress Head: NCAT Lungs: Normal respiratory effort CV: RT radial pulse 2+; cap refill < 2 secs Skin: RT hand abrasion dorsal lateral aspect in v-shape, approximately 3-4 cm in length; bleeding controlled Hand: Strength and sensation intact about the hand Psychological: alert and cooperative; normal mood and affect  ASSESSMENT & PLAN:  1. Abrasion of skin of right hand   2. Dog bite of right hand, initial encounter     Meds ordered this encounter  Medications  . amoxicillin-clavulanate (AUGMENTIN) 875-125 MG tablet    Sig: Take 1 tablet by mouth every 12 (twelve) hours for 10 days.    Dispense:  20 tablet    Refill:  0    Order Specific Question:   Supervising Provider    Answer:   Raylene Everts [id number]  . acetaminophen (TYLENOL) 650 MG CR tablet    Sig: Take 1 tablet (650 mg total) by mouth every 8 (eight) hours as needed for pain.    Dispense:  30 tablet    Refill:  0    Order Specific Question:   Supervising Provider    Answer:   Raylene Everts [id number]    Declines x-ray; patient not concerned for retained FB or  tooth Steri-strips applied Bandage applied Keep covered for next and dry for next 24-48 hours.  After then you may gently clean with warm water and mild soap.  Avoid submerging wound in water. Change dressing daily and apply a thin  layer of neosporin.  Tylenol prescribed.  Take as needed for pain Augmentin prescribed.  Take as directed to help prevent infection Follow up with PCP as needed Return sooner or go to the ED if you have any new or worsening symptoms such as increased pain, redness, swelling, drainage, discharge, decreased range of motion of extremity, etc..    Reviewed expectations re: course of current medical issues. Questions answered. Outlined signs and symptoms indicating need for more acute intervention. Patient verbalized understanding. After Visit Summary given.   Lestine Box, PA-C 01/26/19 [place], [place], PA-C 01/26/19 1644

## 2019-01-26 NOTE — ED Triage Notes (Signed)
Pt presents with dog bite to right hand, pt was bit by own dog and shots are up to date . Bleeding controlled

## 2019-02-03 DIAGNOSIS — Z940 Kidney transplant status: Secondary | ICD-10-CM | POA: Diagnosis not present

## 2019-02-03 DIAGNOSIS — D509 Iron deficiency anemia, unspecified: Secondary | ICD-10-CM | POA: Diagnosis not present

## 2019-02-03 DIAGNOSIS — R634 Abnormal weight loss: Secondary | ICD-10-CM | POA: Diagnosis not present

## 2019-02-03 DIAGNOSIS — R197 Diarrhea, unspecified: Secondary | ICD-10-CM | POA: Diagnosis not present

## 2019-02-04 ENCOUNTER — Telehealth: Payer: Self-pay | Admitting: Emergency Medicine

## 2019-02-04 MED ORDER — MUPIROCIN CALCIUM 2 % EX CREA: 1.0000 "application " | TOPICAL_CREAM | Freq: Two times a day (BID) | CUTANEOUS | 0 refills | Status: DC

## 2019-02-04 NOTE — Telephone Encounter (Signed)
Pt seen on 12/30 for dog bite.  Has been taking augmentin.  Requests topical antibiotic as well.  Bactroban sent into pharmacy.  Instructed patient to follow up in 1-2 days if symptoms not improving or worsening.  Patient understands and is in agreement.

## 2019-02-08 DIAGNOSIS — K2930 Chronic superficial gastritis without bleeding: Secondary | ICD-10-CM | POA: Diagnosis not present

## 2019-02-08 DIAGNOSIS — D509 Iron deficiency anemia, unspecified: Secondary | ICD-10-CM | POA: Diagnosis not present

## 2019-02-08 DIAGNOSIS — K6389 Other specified diseases of intestine: Secondary | ICD-10-CM | POA: Diagnosis not present

## 2019-02-08 DIAGNOSIS — K2950 Unspecified chronic gastritis without bleeding: Secondary | ICD-10-CM | POA: Diagnosis not present

## 2019-02-08 DIAGNOSIS — R197 Diarrhea, unspecified: Secondary | ICD-10-CM | POA: Diagnosis not present

## 2019-02-08 DIAGNOSIS — R634 Abnormal weight loss: Secondary | ICD-10-CM | POA: Diagnosis not present

## 2019-02-10 ENCOUNTER — Telehealth: Payer: Self-pay

## 2019-02-10 NOTE — Telephone Encounter (Signed)
Pt's pharmacy sent prior authorization form for provider to fill out. Pt informed that this [facility] does not perform prior authorizations. Pt informed that she could either call her pharmacy to see which medication would be equivalent and covered by insurance or she could use neosporin ointment, per [place], [place]. Pt states she will try neosporin ointment first.

## 2019-02-16 DIAGNOSIS — Z79899 Other long term (current) drug therapy: Secondary | ICD-10-CM | POA: Diagnosis not present

## 2019-02-16 DIAGNOSIS — D509 Iron deficiency anemia, unspecified: Secondary | ICD-10-CM | POA: Diagnosis not present

## 2019-02-16 DIAGNOSIS — Z6841 Body Mass Index (BMI) 40.0 and over, adult: Secondary | ICD-10-CM | POA: Diagnosis not present

## 2019-02-16 DIAGNOSIS — R634 Abnormal weight loss: Secondary | ICD-10-CM | POA: Diagnosis not present

## 2019-02-16 DIAGNOSIS — R197 Diarrhea, unspecified: Secondary | ICD-10-CM | POA: Diagnosis not present

## 2019-02-21 DIAGNOSIS — E119 Type 2 diabetes mellitus without complications: Secondary | ICD-10-CM | POA: Diagnosis not present

## 2019-02-21 DIAGNOSIS — E782 Mixed hyperlipidemia: Secondary | ICD-10-CM | POA: Diagnosis not present

## 2019-02-21 DIAGNOSIS — E790 Hyperuricemia without signs of inflammatory arthritis and tophaceous disease: Secondary | ICD-10-CM | POA: Diagnosis not present

## 2019-02-21 DIAGNOSIS — D631 Anemia in chronic kidney disease: Secondary | ICD-10-CM | POA: Diagnosis not present

## 2019-02-21 DIAGNOSIS — E559 Vitamin D deficiency, unspecified: Secondary | ICD-10-CM | POA: Diagnosis not present

## 2019-02-21 DIAGNOSIS — E042 Nontoxic multinodular goiter: Secondary | ICD-10-CM | POA: Diagnosis not present

## 2019-02-21 DIAGNOSIS — Z940 Kidney transplant status: Secondary | ICD-10-CM | POA: Diagnosis not present

## 2019-02-21 DIAGNOSIS — N183 Chronic kidney disease, stage 3 unspecified: Secondary | ICD-10-CM | POA: Diagnosis not present

## 2019-02-21 DIAGNOSIS — E039 Hypothyroidism, unspecified: Secondary | ICD-10-CM | POA: Diagnosis not present

## 2019-02-23 DIAGNOSIS — E119 Type 2 diabetes mellitus without complications: Secondary | ICD-10-CM | POA: Diagnosis not present

## 2019-02-23 DIAGNOSIS — Z940 Kidney transplant status: Secondary | ICD-10-CM | POA: Diagnosis not present

## 2019-02-23 DIAGNOSIS — I129 Hypertensive chronic kidney disease with stage 1 through stage 4 chronic kidney disease, or unspecified chronic kidney disease: Secondary | ICD-10-CM | POA: Diagnosis not present

## 2019-02-23 DIAGNOSIS — N182 Chronic kidney disease, stage 2 (mild): Secondary | ICD-10-CM | POA: Diagnosis not present

## 2019-02-25 DIAGNOSIS — N2581 Secondary hyperparathyroidism of renal origin: Secondary | ICD-10-CM | POA: Diagnosis not present

## 2019-02-25 DIAGNOSIS — Z940 Kidney transplant status: Secondary | ICD-10-CM | POA: Diagnosis not present

## 2019-02-25 DIAGNOSIS — I10 Essential (primary) hypertension: Secondary | ICD-10-CM | POA: Diagnosis not present

## 2019-02-25 DIAGNOSIS — N189 Chronic kidney disease, unspecified: Secondary | ICD-10-CM | POA: Diagnosis not present

## 2019-03-03 ENCOUNTER — Encounter (HOSPITAL_COMMUNITY): Payer: BC Managed Care – PPO

## 2019-03-04 DIAGNOSIS — D509 Iron deficiency anemia, unspecified: Secondary | ICD-10-CM | POA: Diagnosis not present

## 2019-03-04 DIAGNOSIS — R634 Abnormal weight loss: Secondary | ICD-10-CM | POA: Diagnosis not present

## 2019-03-04 DIAGNOSIS — R197 Diarrhea, unspecified: Secondary | ICD-10-CM | POA: Diagnosis not present

## 2019-03-10 DIAGNOSIS — D509 Iron deficiency anemia, unspecified: Secondary | ICD-10-CM | POA: Diagnosis not present

## 2019-03-10 DIAGNOSIS — Z800 Family history of malignant neoplasm of digestive organs: Secondary | ICD-10-CM | POA: Diagnosis not present

## 2019-03-10 DIAGNOSIS — R634 Abnormal weight loss: Secondary | ICD-10-CM | POA: Diagnosis not present

## 2019-03-10 DIAGNOSIS — R197 Diarrhea, unspecified: Secondary | ICD-10-CM | POA: Diagnosis not present

## 2019-03-14 DIAGNOSIS — N189 Chronic kidney disease, unspecified: Secondary | ICD-10-CM | POA: Diagnosis not present

## 2019-03-14 DIAGNOSIS — Z940 Kidney transplant status: Secondary | ICD-10-CM | POA: Diagnosis not present

## 2019-03-14 DIAGNOSIS — D849 Immunodeficiency, unspecified: Secondary | ICD-10-CM | POA: Diagnosis not present

## 2019-03-24 DIAGNOSIS — I10 Essential (primary) hypertension: Secondary | ICD-10-CM | POA: Diagnosis not present

## 2019-03-24 DIAGNOSIS — N2581 Secondary hyperparathyroidism of renal origin: Secondary | ICD-10-CM | POA: Diagnosis not present

## 2019-03-24 DIAGNOSIS — Z940 Kidney transplant status: Secondary | ICD-10-CM | POA: Diagnosis not present

## 2019-03-24 DIAGNOSIS — N189 Chronic kidney disease, unspecified: Secondary | ICD-10-CM | POA: Diagnosis not present

## 2019-03-28 DIAGNOSIS — Z940 Kidney transplant status: Secondary | ICD-10-CM | POA: Diagnosis not present

## 2019-03-29 DIAGNOSIS — R197 Diarrhea, unspecified: Secondary | ICD-10-CM | POA: Diagnosis not present

## 2019-03-29 DIAGNOSIS — R634 Abnormal weight loss: Secondary | ICD-10-CM | POA: Diagnosis not present

## 2019-03-29 DIAGNOSIS — K6389 Other specified diseases of intestine: Secondary | ICD-10-CM | POA: Diagnosis not present

## 2019-03-29 DIAGNOSIS — K648 Other hemorrhoids: Secondary | ICD-10-CM | POA: Diagnosis not present

## 2019-04-05 DIAGNOSIS — Z940 Kidney transplant status: Secondary | ICD-10-CM | POA: Diagnosis not present

## 2019-04-08 DIAGNOSIS — N189 Chronic kidney disease, unspecified: Secondary | ICD-10-CM | POA: Diagnosis not present

## 2019-04-08 DIAGNOSIS — K529 Noninfective gastroenteritis and colitis, unspecified: Secondary | ICD-10-CM | POA: Diagnosis not present

## 2019-04-08 DIAGNOSIS — N2581 Secondary hyperparathyroidism of renal origin: Secondary | ICD-10-CM | POA: Diagnosis not present

## 2019-04-08 DIAGNOSIS — I10 Essential (primary) hypertension: Secondary | ICD-10-CM | POA: Diagnosis not present

## 2019-04-08 DIAGNOSIS — Z940 Kidney transplant status: Secondary | ICD-10-CM | POA: Diagnosis not present

## 2019-04-13 DIAGNOSIS — R197 Diarrhea, unspecified: Secondary | ICD-10-CM | POA: Diagnosis not present

## 2019-04-15 ENCOUNTER — Inpatient Hospital Stay (HOSPITAL_COMMUNITY)
Admission: EM | Admit: 2019-04-15 | Discharge: 2019-04-21 | DRG: 392 | Disposition: A | Discharge status: Home / Self Care | Payer: BC Managed Care – PPO | Attending: Internal Medicine | Admitting: Internal Medicine

## 2019-04-15 ENCOUNTER — Encounter (HOSPITAL_COMMUNITY): Payer: Self-pay | Admitting: Emergency Medicine

## 2019-04-15 ENCOUNTER — Other Ambulatory Visit: Payer: Self-pay

## 2019-04-15 DIAGNOSIS — E875 Hyperkalemia: Secondary | ICD-10-CM | POA: Diagnosis not present

## 2019-04-15 DIAGNOSIS — R197 Diarrhea, unspecified: Secondary | ICD-10-CM | POA: Diagnosis not present

## 2019-04-15 DIAGNOSIS — N189 Chronic kidney disease, unspecified: Secondary | ICD-10-CM | POA: Diagnosis not present

## 2019-04-15 DIAGNOSIS — D631 Anemia in chronic kidney disease: Secondary | ICD-10-CM | POA: Diagnosis present

## 2019-04-15 DIAGNOSIS — Z853 Personal history of malignant neoplasm of breast: Secondary | ICD-10-CM | POA: Diagnosis not present

## 2019-04-15 DIAGNOSIS — E872 Acidosis, unspecified: Secondary | ICD-10-CM | POA: Diagnosis present

## 2019-04-15 DIAGNOSIS — Z885 Allergy status to narcotic agent status: Secondary | ICD-10-CM | POA: Diagnosis not present

## 2019-04-15 DIAGNOSIS — E861 Hypovolemia: Secondary | ICD-10-CM | POA: Diagnosis not present

## 2019-04-15 DIAGNOSIS — A0811 Acute gastroenteropathy due to Norwalk agent: Secondary | ICD-10-CM | POA: Diagnosis not present

## 2019-04-15 DIAGNOSIS — Z79899 Other long term (current) drug therapy: Secondary | ICD-10-CM

## 2019-04-15 DIAGNOSIS — K529 Noninfective gastroenteritis and colitis, unspecified: Secondary | ICD-10-CM | POA: Diagnosis present

## 2019-04-15 DIAGNOSIS — E039 Hypothyroidism, unspecified: Secondary | ICD-10-CM | POA: Diagnosis not present

## 2019-04-15 DIAGNOSIS — E876 Hypokalemia: Secondary | ICD-10-CM | POA: Diagnosis not present

## 2019-04-15 DIAGNOSIS — Z79890 Hormone replacement therapy (postmenopausal): Secondary | ICD-10-CM | POA: Diagnosis not present

## 2019-04-15 DIAGNOSIS — I10 Essential (primary) hypertension: Secondary | ICD-10-CM | POA: Diagnosis not present

## 2019-04-15 DIAGNOSIS — Z87891 Personal history of nicotine dependence: Secondary | ICD-10-CM | POA: Diagnosis not present

## 2019-04-15 DIAGNOSIS — Z20822 Contact with and (suspected) exposure to covid-19: Secondary | ICD-10-CM | POA: Diagnosis not present

## 2019-04-15 DIAGNOSIS — Z808 Family history of malignant neoplasm of other organs or systems: Secondary | ICD-10-CM

## 2019-04-15 DIAGNOSIS — Z833 Family history of diabetes mellitus: Secondary | ICD-10-CM | POA: Diagnosis not present

## 2019-04-15 DIAGNOSIS — Z940 Kidney transplant status: Secondary | ICD-10-CM

## 2019-04-15 DIAGNOSIS — Z820 Family history of epilepsy and other diseases of the nervous system: Secondary | ICD-10-CM

## 2019-04-15 HISTORY — DX: Kidney transplant status: Z94.0

## 2019-04-15 HISTORY — DX: Hypomagnesemia: E83.42

## 2019-04-15 HISTORY — DX: Essential (primary) hypertension: I10

## 2019-04-15 LAB — COMPREHENSIVE METABOLIC PANEL
ALT: 14 U/L (ref 0–44)
AST: 13 U/L — ABNORMAL LOW (ref 15–41)
Albumin: 2.8 g/dL — ABNORMAL LOW (ref 3.5–5.0)
Alkaline Phosphatase: 63 U/L (ref 38–126)
Anion gap: 8 (ref 5–15)
BUN: 8 mg/dL (ref 8–23)
CO2: 13 mmol/L — ABNORMAL LOW (ref 22–32)
Calcium: 5 mg/dL — CL (ref 8.9–10.3)
Chloride: 124 mmol/L — ABNORMAL HIGH (ref 98–111)
Creatinine, Ser: 1.22 mg/dL — ABNORMAL HIGH (ref 0.44–1.00)
GFR calc Af Amer: 55 mL/min — ABNORMAL LOW (ref >60)
GFR calc non Af Amer: 48 mL/min — ABNORMAL LOW (ref >60)
Glucose, Bld: 86 mg/dL (ref 70–99)
Potassium: 3.4 mmol/L — ABNORMAL LOW (ref 3.5–5.1)
Sodium: 145 mmol/L (ref 135–145)
Total Bilirubin: 0.3 mg/dL (ref 0.3–1.2)
Total Protein: 4.6 g/dL — ABNORMAL LOW (ref 6.5–8.1)

## 2019-04-15 LAB — URINALYSIS, ROUTINE W REFLEX MICROSCOPIC
Bilirubin Urine: NEGATIVE
Glucose, UA: NEGATIVE mg/dL
Hgb urine dipstick: NEGATIVE
Ketones, ur: NEGATIVE mg/dL
Nitrite: NEGATIVE
Protein, ur: 30 mg/dL — AB
Specific Gravity, Urine: 1.013 (ref 1.005–1.030)
pH: 5 (ref 5.0–8.0)

## 2019-04-15 LAB — MAGNESIUM: Magnesium: 0.5 mg/dL — CL (ref 1.7–2.4)

## 2019-04-15 LAB — CBC
HCT: 27.8 % — ABNORMAL LOW (ref 36.0–46.0)
Hemoglobin: 8.5 g/dL — ABNORMAL LOW (ref 12.0–15.0)
MCH: 29.6 pg (ref 26.0–34.0)
MCHC: 30.6 g/dL (ref 30.0–36.0)
MCV: 96.9 fL (ref 80.0–100.0)
Platelets: 255 10*3/uL (ref 150–400)
RBC: 2.87 MIL/uL — ABNORMAL LOW (ref 3.87–5.11)
RDW: 19 % — ABNORMAL HIGH (ref 11.5–15.5)
WBC: 7 10*3/uL (ref 4.0–10.5)
nRBC: 0 % (ref 0.0–0.2)

## 2019-04-15 LAB — LIPASE, BLOOD: Lipase: 150 U/L — ABNORMAL HIGH (ref 11–51)

## 2019-04-15 LAB — SARS CORONAVIRUS 2 (TAT 6-24 HRS): SARS Coronavirus 2: NEGATIVE

## 2019-04-15 MED ORDER — MAGNESIUM SULFATE 2 GM/50ML IV SOLN
2.0000 g | Freq: Once | INTRAVENOUS | Status: AC
  Administered 2019-04-16: 05:00:00 2 g via INTRAVENOUS
  Filled 2019-04-15: qty 50

## 2019-04-15 MED ORDER — CALCIUM GLUCONATE-NACL 1-0.675 GM/50ML-% IV SOLN
1.0000 g | Freq: Once | INTRAVENOUS | Status: AC
  Administered 2019-04-15: 1000 mg via INTRAVENOUS
  Filled 2019-04-15: qty 50

## 2019-04-15 MED ORDER — HYDROCORTISONE ACETATE 25 MG RE SUPP
25.0000 mg | Freq: Two times a day (BID) | RECTAL | Status: DC
  Administered 2019-04-15 – 2019-04-21 (×11): 25 mg via RECTAL
  Filled 2019-04-15 (×12): qty 1

## 2019-04-15 MED ORDER — ENOXAPARIN SODIUM 30 MG/0.3ML ~~LOC~~ SOLN
30.0000 mg | SUBCUTANEOUS | Status: DC
  Administered 2019-04-15 – 2019-04-20 (×6): 30 mg via SUBCUTANEOUS
  Filled 2019-04-15 (×6): qty 0.3

## 2019-04-15 MED ORDER — MYCOPHENOLATE MOFETIL 250 MG PO CAPS
500.0000 mg | ORAL_CAPSULE | ORAL | Status: AC
  Administered 2019-04-15: 23:00:00 500 mg via ORAL
  Filled 2019-04-15: qty 2

## 2019-04-15 MED ORDER — LACTATED RINGERS IV BOLUS
1000.0000 mL | Freq: Once | INTRAVENOUS | Status: AC
  Administered 2019-04-15: 1000 mL via INTRAVENOUS

## 2019-04-15 MED ORDER — LEVOTHYROXINE SODIUM 25 MCG PO TABS
137.0000 ug | ORAL_TABLET | Freq: Every day | ORAL | Status: DC
  Administered 2019-04-16 – 2019-04-21 (×6): 137 ug via ORAL
  Filled 2019-04-15 (×6): qty 1

## 2019-04-15 MED ORDER — PREDNISONE 5 MG PO TABS
5.0000 mg | ORAL_TABLET | Freq: Every day | ORAL | Status: DC
  Administered 2019-04-16 – 2019-04-21 (×6): 5 mg via ORAL
  Filled 2019-04-15 (×7): qty 1

## 2019-04-15 MED ORDER — POTASSIUM CHLORIDE CRYS ER 20 MEQ PO TBCR
20.0000 meq | EXTENDED_RELEASE_TABLET | Freq: Two times a day (BID) | ORAL | Status: DC
  Administered 2019-04-15 – 2019-04-16 (×2): 20 meq via ORAL
  Filled 2019-04-15 (×2): qty 1

## 2019-04-15 MED ORDER — TACROLIMUS 1 MG PO CAPS
1.0000 mg | ORAL_CAPSULE | Freq: Two times a day (BID) | ORAL | Status: DC
  Administered 2019-04-15 – 2019-04-21 (×12): 1 mg via ORAL
  Filled 2019-04-15 (×12): qty 1

## 2019-04-15 MED ORDER — ONDANSETRON HCL 4 MG/2ML IJ SOLN
4.0000 mg | Freq: Four times a day (QID) | INTRAMUSCULAR | Status: DC | PRN
  Administered 2019-04-16 – 2019-04-18 (×3): 4 mg via INTRAVENOUS
  Filled 2019-04-15 (×3): qty 2

## 2019-04-15 MED ORDER — SODIUM CHLORIDE 0.9 % IV SOLN: 1.0000 g | Freq: Once | INTRAVENOUS | Status: DC

## 2019-04-15 MED ORDER — OMEGA-3 1000 MG PO CAPS: 1000.0000 mg | ORAL_CAPSULE | Freq: Every day | ORAL | Status: DC

## 2019-04-15 MED ORDER — ACETAMINOPHEN 325 MG PO TABS
650.0000 mg | ORAL_TABLET | Freq: Four times a day (QID) | ORAL | Status: DC | PRN
  Administered 2019-04-16 – 2019-04-20 (×8): 650 mg via ORAL
  Filled 2019-04-15 (×8): qty 2

## 2019-04-15 MED ORDER — CALCIUM CARBONATE ANTACID 500 MG PO CHEW
2.0000 | CHEWABLE_TABLET | Freq: Three times a day (TID) | ORAL | Status: DC
  Administered 2019-04-15 – 2019-04-21 (×17): 400 mg via ORAL
  Filled 2019-04-15 (×17): qty 2

## 2019-04-15 MED ORDER — ZINC OXIDE 40 % EX OINT
TOPICAL_OINTMENT | CUTANEOUS | Status: DC | PRN
  Filled 2019-04-15: qty 57

## 2019-04-15 MED ORDER — OMEGA-3-ACID ETHYL ESTERS 1 G PO CAPS
1.0000 g | ORAL_CAPSULE | Freq: Every day | ORAL | Status: DC
  Administered 2019-04-16 – 2019-04-21 (×6): 1 g via ORAL
  Filled 2019-04-15 (×6): qty 1

## 2019-04-15 MED ORDER — VITAMIN B-12 1000 MCG PO TABS
2000.0000 ug | ORAL_TABLET | Freq: Every day | ORAL | Status: DC
  Administered 2019-04-16 – 2019-04-21 (×6): 2000 ug via ORAL
  Filled 2019-04-15 (×6): qty 2

## 2019-04-15 MED ORDER — SODIUM BICARBONATE 650 MG PO TABS: 1300.0000 mg | ORAL_TABLET | Freq: Three times a day (TID) | ORAL | Status: DC

## 2019-04-15 MED ORDER — ADULT MULTIVITAMIN W/MINERALS CH
ORAL_TABLET | Freq: Every day | ORAL | Status: DC
  Administered 2019-04-16 – 2019-04-21 (×6): 1 via ORAL
  Filled 2019-04-15 (×6): qty 1

## 2019-04-15 MED ORDER — FERROUS SULFATE 325 (65 FE) MG PO TABS
325.0000 mg | ORAL_TABLET | Freq: Three times a day (TID) | ORAL | Status: DC
  Administered 2019-04-16 – 2019-04-17 (×4): 325 mg via ORAL
  Filled 2019-04-15 (×4): qty 1

## 2019-04-15 MED ORDER — CALCIUM GLUCONATE-NACL 1-0.675 GM/50ML-% IV SOLN
1.0000 g | Freq: Once | INTRAVENOUS | Status: AC
  Administered 2019-04-15: 1000 mg via INTRAVENOUS
  Filled 2019-04-15 (×2): qty 50

## 2019-04-15 MED ORDER — POTASSIUM CHLORIDE CRYS ER 20 MEQ PO TBCR: 40.0000 meq | EXTENDED_RELEASE_TABLET | Freq: Every day | ORAL | Status: DC

## 2019-04-15 MED ORDER — ATENOLOL 25 MG PO TABS
25.0000 mg | ORAL_TABLET | Freq: Every day | ORAL | Status: DC
  Administered 2019-04-16 – 2019-04-21 (×6): 25 mg via ORAL
  Filled 2019-04-15 (×6): qty 1

## 2019-04-15 MED ORDER — SODIUM CHLORIDE 0.9% FLUSH
3.0000 mL | Freq: Once | INTRAVENOUS | Status: AC
  Administered 2019-04-15: 3 mL via INTRAVENOUS

## 2019-04-15 MED ORDER — LOPERAMIDE HCL 2 MG PO CAPS
2.0000 mg | ORAL_CAPSULE | Freq: Three times a day (TID) | ORAL | Status: DC | PRN
  Administered 2019-04-16 – 2019-04-21 (×5): 2 mg via ORAL
  Filled 2019-04-15 (×6): qty 1

## 2019-04-15 MED ORDER — VITAMIN D (ERGOCALCIFEROL) 1.25 MG (50000 UNIT) PO CAPS
50000.0000 [IU] | ORAL_CAPSULE | ORAL | Status: DC
  Administered 2019-04-17: 50000 [IU] via ORAL
  Filled 2019-04-15: qty 1

## 2019-04-15 MED ORDER — RIFAXIMIN 550 MG PO TABS
550.0000 mg | ORAL_TABLET | Freq: Two times a day (BID) | ORAL | Status: DC
  Administered 2019-04-15 – 2019-04-21 (×12): 550 mg via ORAL
  Filled 2019-04-15 (×12): qty 1

## 2019-04-15 MED ORDER — MYCOPHENOLATE MOFETIL 250 MG PO CAPS
1000.0000 mg | ORAL_CAPSULE | ORAL | Status: DC
  Administered 2019-04-16 – 2019-04-20 (×5): 1000 mg via ORAL
  Filled 2019-04-15 (×7): qty 4

## 2019-04-15 MED ORDER — MYCOPHENOLATE MOFETIL 250 MG PO CAPS
500.0000 mg | ORAL_CAPSULE | ORAL | Status: DC
  Administered 2019-04-16 – 2019-04-20 (×5): 500 mg via ORAL
  Filled 2019-04-15 (×6): qty 2

## 2019-04-15 MED ORDER — SODIUM BICARBONATE 650 MG PO TABS
1300.0000 mg | ORAL_TABLET | Freq: Three times a day (TID) | ORAL | Status: DC
  Administered 2019-04-15 – 2019-04-21 (×17): 1300 mg via ORAL
  Filled 2019-04-15 (×17): qty 2

## 2019-04-15 MED ORDER — MYCOPHENOLATE MOFETIL 500 MG PO TABS: 500.0000 mg | ORAL_TABLET | Freq: Two times a day (BID) | ORAL | Status: DC

## 2019-04-15 MED ORDER — LACTATED RINGERS IV SOLN: INTRAVENOUS | Status: DC

## 2019-04-15 MED ORDER — ZINC OXIDE 13 % EX CREA: TOPICAL_CREAM | CUTANEOUS | Status: DC

## 2019-04-15 MED ORDER — ACETAMINOPHEN 650 MG RE SUPP: 650.0000 mg | Freq: Four times a day (QID) | RECTAL | Status: DC | PRN

## 2019-04-15 MED ORDER — SODIUM CHLORIDE 0.9 % IV BOLUS: 1000.0000 mL | Freq: Once | INTRAVENOUS | Status: DC

## 2019-04-15 MED ORDER — ONDANSETRON HCL 4 MG PO TABS: 4.0000 mg | ORAL_TABLET | Freq: Four times a day (QID) | ORAL | Status: DC | PRN

## 2019-04-15 MED ORDER — FEBUXOSTAT 40 MG PO TABS
40.0000 mg | ORAL_TABLET | Freq: Every day | ORAL | Status: DC
  Administered 2019-04-16 – 2019-04-21 (×6): 40 mg via ORAL
  Filled 2019-04-15 (×6): qty 1

## 2019-04-15 MED ORDER — PANTOPRAZOLE SODIUM 40 MG PO TBEC
40.0000 mg | DELAYED_RELEASE_TABLET | Freq: Every day | ORAL | Status: DC
  Administered 2019-04-16: 40 mg via ORAL
  Filled 2019-04-15: qty 1

## 2019-04-15 MED ORDER — CALCITRIOL 0.25 MCG PO CAPS
0.2500 ug | ORAL_CAPSULE | Freq: Every day | ORAL | Status: DC
  Administered 2019-04-16 – 2019-04-21 (×6): 0.25 ug via ORAL
  Filled 2019-04-15 (×6): qty 1

## 2019-04-15 NOTE — ED Triage Notes (Signed)
Pt reports to ED due to her PCP calling her after having blood work drawn this morning. Pt does not know what was abnormal. Pt does report not feeling well since November and has constant diarrhea since. Pt reports losing over 20 lbs in the last 6 months.

## 2019-04-15 NOTE — Progress Notes (Addendum)
Transplant pt with chronic diarrhea Last saw Dr Joelyn Oms 04/08/19 On calcitriol 0.25 mcg daily ergocalciferol 50,000 u weekly calcium carbonate supplements Bicarb 1300 TID. - potassium 40 MEq daily  Cr 1.22, near baseline.   Extensive workup for chronic diarrhea.  CMV viral load negative 3/12  Recommend using LR for isotonic fluid resuscitation. - 2 g calcium gluconate IV  -  Continue sodium bicarb 1300 TID - continue TUMS 2 tabs TID - continue calcitriol and ergocalciferol - continue K supps 40 mEQ daily - Mg 0.5--> aggressively replete this, note that magnesium oxide causes diarrhea so will need to find an alternative oral agent- magnesium gluconate perhaps  Formal c/s to follow.    Madelon Lips MD [facility] pgr [phone]

## 2019-04-15 NOTE — ED Provider Notes (Signed)
[facility] EMERGENCY DEPARTMENT Provider Note   CSN: [id number] Arrival date & time: 04/15/19  1513     History Chief Complaint  Patient presents with  . Diarrhea  . Abnormal Lab    Vanessa [name] is a 59 y.o. female.  The history is provided by the patient.  Diarrhea Quality:  Watery Severity:  Moderate Onset quality:  Gradual Timing:  Constant Progression:  Unchanged Relieved by:  Nothing Worsened by:  Nothing Associated symptoms: no abdominal pain, no arthralgias, no chills, no recent cough, no diaphoresis, no fever, no headaches, no myalgias, no URI and no vomiting   Risk factors: no recent antibiotic use        Past Medical History:  Diagnosis Date  . Anemia in chronic kidney disease(285.21) 03/16/2013  . Brain aneurysm 03/16/2013   Age 58 (1983)  . H/O kidney transplant 03/16/2013  . History of left breast cancer 03/16/2013  . Hx gestational diabetes 03/16/2013  . Hx of primary hypertension 03/16/2013    Patient Active Problem List   Diagnosis Date Noted  . Cellulitis 04/21/2015  . Hypertension 04/21/2015  . Hyponatremia 04/21/2015  . Infected wound 04/21/2015  . Breast cancer, left breast ([place]) 04/18/2014  . Anemia in neoplastic disease 04/18/2014  . History of left breast cancer 03/16/2013  . Anemia in chronic renal disease 03/16/2013  . H/O kidney transplant 03/16/2013  . Brain aneurysm 03/16/2013  . Hx of primary hypertension 03/16/2013  . Hx gestational diabetes 03/16/2013    Past Surgical History:  Procedure Laterality Date  . BREAST EXCISIONAL BIOPSY Left      OB History   No obstetric history on file.     Family History  Problem Relation Age of Onset  . Brain cancer Mother   . Diabetes type II Father   . Parkinson's disease Father     Social History   Tobacco Use  . Smoking status: Former Smoker    Packs/day: 0.50    Years: 2.00    Pack years: 1.00    Types: Cigarettes  . Smokeless tobacco: Never Used    Substance Use Topics  . Alcohol use: No  . Drug use: No    Home Medications Prior to Admission medications   Medication Sig Start Date End Date Taking? Authorizing Provider  acetaminophen (TYLENOL) 500 MG tablet Take 1,000 mg by mouth every 6 (six) hours as needed for moderate pain.    [provider]  acetaminophen (TYLENOL) 650 MG CR tablet Take 1 tablet (650 mg total) by mouth every 8 (eight) hours as needed for pain. 01/26/19   Wurst, [place], PA-C  amoxicillin (AMOXIL) 500 MG capsule Reported on 07/12/2015 05/22/15   [provider]  atenolol (TENORMIN) 25 MG tablet  06/20/15   [provider]  calcitRIOL (ROCALTROL) 0.25 MCG capsule Take 0.25 mcg by mouth daily. 10/10/18   [provider]  colchicine 0.6 MG tablet Take 2 now and 1 in 1 hour.  May repeat dose once daily.  For gout attack. Patient taking differently: Take 0.6 mg by mouth daily as needed (for flareup). Take 2 now and 1 in 1 hour.  May repeat dose once daily.  For gout attack. 02/08/12   Harden Mo, MD  [facility] VITAMIN B12 1000 MCG tablet Take 2,000 mcg by mouth daily. 04/03/15   [provider]  furosemide (LASIX) 20 MG tablet Take 20 mg by mouth daily as needed for fluid or edema. Reported on 07/12/2015  [provider]  mupirocin cream (BACTROBAN) 2 % Apply 1 application topically 2 (two) times daily. 02/04/19   Wurst, [place], PA-C  mycophenolate (CELLCEPT) 500 MG tablet Take 500-1,000 mg by mouth 2 (two) times daily. Takes 2 tabs in am and 1 tab in pm 02/21/13   [provider]  ondansetron (ZOFRAN ODT) 4 MG disintegrating tablet Take 1 tablet (4 mg total) by mouth every 8 (eight) hours as needed for nausea or vomiting. 12/20/18   Corena Herter, PA-C  SYNTHROID 137 MCG tablet Take 137 mcg by mouth daily before breakfast.  01/25/13   [provider]  tacrolimus (PROGRAF) 1 MG capsule Take 1 mg by mouth 2 (two) times daily.  02/27/13   [provider]  tamoxifen (NOLVADEX) 20 MG tablet TAKE 1 TABLET DAILY 03/11/18   Gardenia Phlegm, NP  tamoxifen (NOLVADEX) 20 MG tablet Take 1 tablet (20 mg total) by mouth daily. 12/21/17   Gardenia Phlegm, NP  ULORIC 40 MG tablet Take 40 mg by mouth daily.  02/18/13   [provider]  Vitamin D, Ergocalciferol, (DRISDOL) 50000 units CAPS capsule Take 1 capsule by mouth every Sunday. 03/29/15   [provider]    Allergies    Codeine  Review of Systems   Review of Systems  Constitutional: Negative for chills, diaphoresis and fever.  HENT: Negative for ear pain and sore throat.   Eyes: Negative for pain and visual disturbance.  Respiratory: Negative for cough and shortness of breath.   Cardiovascular: Negative for chest pain and palpitations.  Gastrointestinal: Positive for diarrhea. Negative for abdominal pain and vomiting.  Genitourinary: Negative for dysuria and hematuria.  Musculoskeletal: Negative for arthralgias, back pain and myalgias.  Skin: Negative for color change and rash.  Neurological: Positive for numbness. Negative for dizziness, tremors, seizures, syncope, facial asymmetry, speech difficulty, weakness, light-headedness and headaches.  All other systems reviewed and are negative.   Physical Exam Updated Vital Signs  ED Triage Vitals  Enc Vitals Group     BP 04/15/19 1517 124/89     Pulse Rate 04/15/19 1517 98     Resp 04/15/19 1517 18     Temp 04/15/19 1517 98.7 F (37.1 C)     Temp Source 04/15/19 1517 Oral     SpO2 04/15/19 1517 100 %     Weight 04/15/19 1517 94 lb (42.6 kg)     Height 04/15/19 1517 5\' 2"  (1.575 m)     Head Circumference --      Peak Flow --      Pain Score 04/15/19 1519 0     Pain Loc --      Pain Edu? --      Excl. in [place]? --     Physical Exam Vitals and nursing note reviewed.  Constitutional:      General: She is not in acute distress.    Appearance: She is well-developed. She is not ill-appearing.   HENT:     Head: Normocephalic and atraumatic.     Nose: Nose normal.     Mouth/Throat:     Mouth: Mucous membranes are dry.  Eyes:     Extraocular Movements: Extraocular movements intact.     Conjunctiva/sclera: Conjunctivae normal.     Pupils: Pupils are equal, round, and reactive to light.  Cardiovascular:     Rate and Rhythm: Normal rate and regular rhythm.     Heart sounds: No murmur.  Pulmonary:     Effort:  Pulmonary effort is normal. No respiratory distress.     Breath sounds: Normal breath sounds.  Abdominal:     General: Abdomen is flat.     Palpations: Abdomen is soft.     Tenderness: There is no abdominal tenderness.  Musculoskeletal:        General: Normal range of motion.     Cervical back: Normal range of motion and neck supple.  Skin:    General: Skin is warm and dry.     Capillary Refill: Capillary refill takes less than 2 seconds.  Neurological:     General: No focal deficit present.     Mental Status: She is alert and oriented to person, place, and time.     Cranial Nerves: No cranial nerve deficit.     Sensory: No sensory deficit.     Motor: No weakness.     Coordination: Coordination normal.  Psychiatric:        Mood and Affect: Mood normal.     ED Results / Procedures / Treatments   Labs (all labs ordered are listed, but only abnormal results are displayed) Labs Reviewed  LIPASE, BLOOD - Abnormal; Notable for the following components:      Result Value   Lipase 150 (*)    All other components within normal limits  COMPREHENSIVE METABOLIC PANEL - Abnormal; Notable for the following components:   Potassium 3.4 (*)    Chloride 124 (*)    CO2 13 (*)    Creatinine, Ser 1.22 (*)    Calcium 5.0 (*)    Total Protein 4.6 (*)    Albumin 2.8 (*)    AST 13 (*)    GFR calc non Af Amer 48 (*)    GFR calc Af Amer 55 (*)    All other components within normal limits  CBC - Abnormal; Notable for the following components:   RBC 2.87 (*)    Hemoglobin 8.5  (*)    HCT 27.8 (*)    RDW 19.0 (*)    All other components within normal limits  URINALYSIS, ROUTINE W REFLEX MICROSCOPIC - Abnormal; Notable for the following components:   Protein, ur 30 (*)    Leukocytes,Ua TRACE (*)    Bacteria, UA RARE (*)    All other components within normal limits  MAGNESIUM - Abnormal; Notable for the following components:   Magnesium 0.5 (*)    All other components within normal limits  SARS CORONAVIRUS 2 (TAT 6-24 HRS)  CALCIUM, IONIZED    EKG EKG Interpretation  Date/Time:  Friday April 15 2019 17:59:37 EDT Ventricular Rate:  81 PR Interval:    QRS Duration: 81 QT Interval:  364 QTC Calculation: 423 R Axis:   71 Text Interpretation: Sinus rhythm Low voltage, extremity leads Confirmed by Lennice Sites [phone]) on 04/15/2019 6:45:08 PM   Radiology No results found.  Procedures .Critical Care Performed by: Lennice Sites, DO Authorized by: Lennice Sites, DO   Critical care provider statement:    Critical care time (minutes):  35   Critical care was necessary to treat or prevent imminent or life-threatening deterioration of the following conditions:  Metabolic crisis   Critical care was time spent personally by me on the following activities:  Blood draw for specimens, development of treatment plan with patient or surrogate, discussions with consultants, discussions with primary provider, evaluation of patient's response to treatment, examination of patient, obtaining history from patient or surrogate, ordering and performing treatments and interventions, ordering and review of  laboratory studies, ordering and review of radiographic studies, pulse oximetry, re-evaluation of patient's condition and review of old charts   I assumed direction of critical care for this patient from another provider in my specialty: no     (including critical care time)  Medications Ordered in ED Medications  calcium gluconate 1 g/ 50 mL sodium chloride IVPB (1,000  mg Intravenous New Bag/Given 04/15/19 1838)  magnesium sulfate IVPB 2 g 50 mL (has no administration in time range)  sodium chloride flush (NS) 0.9 % injection 3 mL (3 mLs Intravenous Given 04/15/19 1840)  lactated ringers bolus 1,000 mL (1,000 mLs Intravenous New Bag/Given 04/15/19 1837)    ED Course  I have reviewed the triage vital signs and the nursing notes.  Pertinent labs & imaging results that were available during my care of the patient were reviewed by me and considered in my medical decision making (see chart for details).    MDM Rules/Calculators/A&P                      Jeanina Failing Goltz is a 59 year old female with history of chronic kidney disease, hypertension who presents to the ED with low calcium, acidosis from nephrology office.  Patient with unremarkable vitals.  No fever.  Patient has been struggling with chronic diarrhea for the last several months.  Has had extensive work-up including endoscopies, colonoscopy, stool studies with no definitive cause.  She has history of transplant kidney and is on immunosuppressants and follows with nephrology.  She has had multiple electrolyte issues in the past.  She has been tested for CMV and other labs to explain diarrhea but have come up unremarkable.  Patient had screening labs done today by her nephrologist and she was found to have a low calcium and low bicarb.  She is on supplements for both bicarb and sodium.  She is also on calcitriol.  Patient has had some paresthesias in her feet but she denies any spasms, perioral numbness or tingling.  EKG shows sinus rhythm.  Normal intervals.  She has some may be mild symptoms of hypocalcemia but this is likely chronic but acutely worse.  Calcium back several months ago was 7.  Repeat calcium today is 5.  Ionized calcium has been ordered.  Magnesium also critically low at 0.5.  Creatinine mildly elevated at 1.2.  Albumin low at 2.8.  Bicarb is 13.  Otherwise no significant leukocytosis or  anemia.  Contacted Dr. Hollie Salk with nephrology and she recommends replacement of calcium and magnesium.  Patient given 2 g IV calcium gluconate, 2 g IV magnesium.  Will hydrate with lactated ringer bolus as well.  She will leave formal recommendations for inpatient team as well as possibly more lab work.  Patient to be admitted for further care and replacement of electrolytes.  This chart was dictated using voice recognition software.  Despite best efforts to proofread,  errors can occur which can change the documentation meaning.    Final Clinical Impression(s) / ED Diagnoses Final diagnoses:  Hypocalcemia  Hypomagnesemia  Diarrhea, unspecified type    Rx / DC Orders ED Discharge Orders    None       Lennice Sites, DO 04/15/19 1912

## 2019-04-15 NOTE — H&P (Signed)
History and Physical    Tresia Revolorio Conrow [id number] DOB: 11-Mar-1960 DOA: 04/15/2019  PCP: [name], MD  Patient coming from: Home  I have personally briefly reviewed patient's old medical records in [place]  Chief Complaint: Diarrhea, abnormal lab  HPI: Vanessa [name] is a 59 y.o. female with medical history significant of Kidney transplant many years ago, BRCA in remission, HTN.  Pt has been having diarrhea since Nov.  Pt being worked up by [facility] for this.  Had positive GI pathogen pnl for norovirus in Jan, remainder of work up neg thus far but ongoing.  Pt sent in to ED today due to abnormal labs with nephrology today: low calcium and low bicarb.   ED Course: Calcium 5.0, Mg 0.5, bicarb 13, Creat 1.2 (baseline).  2g calcium, 2g magnesium in ED, hospitalist asked to admit.  Nephro consulted.   Review of Systems: As per HPI, otherwise all review of systems negative.  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   Age 67 (1983)   H/O kidney transplant 03/16/2013   History of left breast cancer 03/16/2013   Hx gestational diabetes 03/16/2013   Hx of primary hypertension 03/16/2013    Past Surgical History:  Procedure Laterality Date   BREAST EXCISIONAL BIOPSY Left      reports that she has quit smoking. Her smoking use included cigarettes. She has a 1.00 pack-year smoking history. She has never used smokeless tobacco. She reports that she does not drink alcohol or use drugs.  Allergies  Allergen Reactions   Codeine Other (See Comments)    Hallucinations and "[place]"    Family History  Problem Relation Age of Onset   Brain cancer Mother    Diabetes type II Father    Parkinson's disease Father      Prior to Admission medications   Medication Sig Start Date End Date Taking? Authorizing Provider  acetaminophen (TYLENOL) 500 MG tablet Take 1,000 mg by  mouth every 6 (six) hours as needed for mild pain, moderate pain or headache.    Yes [provider]  amoxicillin (AMOXIL) 500 MG capsule Take 2,000 mg by mouth See admin instructions. Take 2,000 mg by mouth one hour prior to all dental cleanings and other procedures 05/22/15  Yes [provider]  atenolol (TENORMIN) 25 MG tablet Take 25 mg by mouth daily.  06/20/15  Yes [provider]  calcitRIOL (ROCALTROL) 0.25 MCG capsule Take 0.25 mcg by mouth daily. 10/10/18  Yes [provider]  colchicine 0.6 MG tablet Take 2 now and 1 in 1 hour.  May repeat dose once daily.  For gout attack. Patient taking differently: Take 0.6-1.2 mg by mouth See admin instructions. Take 1.2 mg by mouth at once, then 0.6 mg in one hour and may repeat once a day- as needed for gout flares 02/08/12  Yes Harden Mo, MD  [facility] VITAMIN B12 1000 MCG tablet Take 2,000 mcg by mouth daily. 04/03/15  Yes [provider]  ferrous sulfate 325 (65 FE) MG EC tablet Take 325 mg by mouth 3 (three) times daily with meals.   Yes [provider]  hydrocortisone (ANUSOL-HC) 25 MG suppository Place 25 mg rectally 2 (two) times daily. am 03/30/19  Yes [provider]  loperamide (IMODIUM A-D) 2 MG tablet Take 2 mg by mouth 3 (three) times daily as needed for diarrhea or loose stools.  Yes [provider]  Multiple Vitamins-Minerals (ONE-A-DAY WOMENS 50+ ADVANTAGE PO) Take 1 tablet by mouth daily with breakfast.   Yes [provider]  mycophenolate (CELLCEPT) 500 MG tablet Take 500-1,000 mg by mouth 2 (two) times daily. Take 1,000 mg by mouth at 8 AM and 500 mg at 8 PM 02/21/13  Yes [provider]  Omega-3 1000 MG CAPS Take 1,000 mg by mouth daily with breakfast.   Yes [provider]  ondansetron (ZOFRAN ODT) 4 MG disintegrating tablet Take 1 tablet (4 mg total) by mouth every 8 (eight) hours as needed for nausea or vomiting. Patient taking differently:  Take 4 mg by mouth every 8 (eight) hours as needed for nausea or vomiting (DISSOLVE ORALLY).  12/20/18  Yes Corena Herter, PA-C  pantoprazole (PROTONIX) 40 MG tablet Take 40 mg by mouth daily before breakfast. 02/16/19  Yes [provider]  potassium chloride SA (KLOR-CON) 20 MEQ tablet Take 20 mEq by mouth 2 (two) times daily.   Yes [provider]  predniSONE (DELTASONE) 5 MG tablet Take 5 mg by mouth daily with breakfast.   Yes [provider]  rifaximin (XIFAXAN) 550 MG TABS tablet Take 550 mg by mouth 2 (two) times daily.   Yes [provider]  sodium bicarbonate 650 MG tablet Take 1,300 mg by mouth 3 (three) times daily.   Yes [provider]  SYNTHROID 137 MCG tablet Take 137 mcg by mouth daily before breakfast.  01/25/13  Yes [provider]  tacrolimus (PROGRAF) 1 MG capsule Take 1 mg by mouth in the morning and at bedtime.  02/27/13  Yes [provider]  ULORIC 40 MG tablet Take 40 mg by mouth daily.  02/18/13  Yes [provider]  Vitamin D, Ergocalciferol, (DRISDOL) 50000 units CAPS capsule Take 50,000 Units by mouth every Sunday.  03/29/15  Yes [provider]  Zinc Oxide (DESITIN RAPID RELIEF EX) Apply 1 application topically See admin instructions. Apply externally to rectum after each loose bowel episode   Yes [provider]  acetaminophen (TYLENOL) 650 MG CR tablet Take 1 tablet (650 mg total) by mouth every 8 (eight) hours as needed for pain. Patient not taking: Reported on 04/15/2019 01/26/19   Wurst, [place], PA-C  furosemide (LASIX) 20 MG tablet Take 20 mg by mouth daily as needed for fluid or edema. Reported on 07/12/2015    [provider]  mupirocin cream (BACTROBAN) 2 % Apply 1 application topically 2 (two) times daily. Patient not taking: Reported on 04/15/2019 02/04/19   Wurst, [place], PA-C  tamoxifen (NOLVADEX) 20 MG tablet TAKE 1 TABLET DAILY Patient not taking: Reported on  04/15/2019 03/11/18   Gardenia Phlegm, NP  tamoxifen (NOLVADEX) 20 MG tablet Take 1 tablet (20 mg total) by mouth daily. Patient not taking: Reported on 04/15/2019 12/21/17   Gardenia Phlegm, NP    Physical Exam: Vitals:   04/15/19 1517 04/15/19 1727 04/15/19 1800 04/15/19 1900  BP: 124/89 114/78 114/79 122/81  Pulse: 98 82 80 79  Resp: 18 12 20  (!) 21  Temp: 98.7 F (37.1 C)     TempSrc: Oral     SpO2: 100% 100% 100% 100%  Weight: 42.6 kg     Height: 5' 2"  (1.575 m)       Constitutional: NAD, calm, comfortable Eyes: PERRL, lids and conjunctivae normal ENMT: Mucous membranes are moist. Posterior pharynx clear of any exudate or lesions.Normal dentition.  Neck: normal, supple, no masses,  no thyromegaly Respiratory: clear to auscultation bilaterally, no wheezing, no crackles. Normal respiratory effort. No accessory muscle use.  Cardiovascular: Regular rate and rhythm, no murmurs / rubs / gallops. No extremity edema. 2+ pedal pulses. No carotid bruits.  Abdomen: no tenderness, no masses palpated. No hepatosplenomegaly. Bowel sounds positive.  Musculoskeletal: no clubbing / cyanosis. No joint deformity upper and lower extremities. Good ROM, no contractures. Normal muscle tone.  Skin: no rashes, lesions, ulcers. No induration Neurologic: CN 2-12 grossly intact. Sensation intact, DTR normal. Strength 5/5 in all 4.  Psychiatric: Normal judgment and insight. Alert and oriented x 3. Normal mood.    Labs on Admission: I have personally reviewed following labs and imaging studies  CBC: Recent Labs  Lab 04/15/19 1537  WBC 7.0  HGB 8.5*  HCT 27.8*  MCV 96.9  PLT 606   Basic Metabolic Panel: Recent Labs  Lab 04/15/19 1537 04/15/19 1722  NA 145  --   K 3.4*  --   CL 124*  --   CO2 13*  --   GLUCOSE 86  --   BUN 8  --   CREATININE 1.22*  --   CALCIUM 5.0*  --   MG  --  0.5*   GFR: Estimated Creatinine Clearance: 32.6 mL/min (A) (by C-G formula based on SCr of  1.22 mg/dL (H)). Liver Function Tests: Recent Labs  Lab 04/15/19 1537  AST 13*  ALT 14  ALKPHOS 63  BILITOT 0.3  PROT 4.6*  ALBUMIN 2.8*   Recent Labs  Lab 04/15/19 1537  LIPASE 150*   No results for input(s): AMMONIA in the last 168 hours. Coagulation Profile: No results for input(s): INR, PROTIME in the last 168 hours. Cardiac Enzymes: No results for input(s): CKTOTAL, CKMB, CKMBINDEX, TROPONINI in the last 168 hours. BNP (last 3 results) No results for input(s): PROBNP in the last 8760 hours. HbA1C: No results for input(s): HGBA1C in the last 72 hours. CBG: No results for input(s): GLUCAP in the last 168 hours. Lipid Profile: No results for input(s): CHOL, HDL, LDLCALC, TRIG, CHOLHDL, LDLDIRECT in the last 72 hours. Thyroid Function Tests: No results for input(s): TSH, T4TOTAL, FREET4, T3FREE, THYROIDAB in the last 72 hours. Anemia Panel: No results for input(s): VITAMINB12, FOLATE, FERRITIN, TIBC, IRON, RETICCTPCT in the last 72 hours. Urine analysis:    Component Value Date/Time   COLORURINE YELLOW 04/15/2019 1633   APPEARANCEUR CLEAR 04/15/2019 1633   LABSPEC 1.013 04/15/2019 1633   PHURINE 5.0 04/15/2019 1633   GLUCOSEU NEGATIVE 04/15/2019 1633   HGBUR NEGATIVE 04/15/2019 1633   BILIRUBINUR NEGATIVE 04/15/2019 1633   [place] 04/15/2019 1633   PROTEINUR 30 (A) 04/15/2019 1633   NITRITE NEGATIVE 04/15/2019 1633   LEUKOCYTESUR TRACE (A) 04/15/2019 1633    Radiological Exams on Admission: No results found.  EKG: Independently reviewed.  Assessment/Plan Principal Problem:   Hypomagnesemia Active Problems:   History of left breast cancer   Anemia in chronic renal disease   H/O kidney transplant   Hypertension   Chronic diarrhea   Hypocalcemia   Metabolic acidosis, NAG, bicarbonate losses   Nucleic acid assay by PCR positive for norovirus    1. Hypomagnesemia, hypocalcemia, NAG met acidosis: 1. nephro consulting 2. 2g Mag 3. 2g  calcium 4. Continue PO bicarb 1300 TID 5. Continue Tums 2 tabs TID 6. Continue Vit D and calcitriol 7. Continue K 66mq PO BID 8. LR at 75 cc/hr 9. Repeat CMP and Mg in AM 2. Chronic diarrhea -  1. Being worked up by GI 2. ? 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 3. Will get 5HIAA while here 4. Will get a repeat stool PCR for norovirus, noted to be positive in Jan. 5. If positive yet again, consider ID consult? 6. Cont imodium PRN 3. Kidney transplant - 1. Continue anti rejection meds 4. H/o BRCA - in remission 5. HTN - cont atenolol  DVT prophylaxis: Lovenox Code Status: Full Family Communication: No family in room Disposition Plan: Home after admit Consults called: Nephrology Admission status: Place in 5    Celie Desrochers, [place] [facility]  How to contact the [facility] Attending or Consulting provider [place] or covering provider during after hours [place], for this patient?  1. Check the care team in [facility] and look for a) attending/consulting [facility] provider listed and b) the [facility] team listed 2. Log into [email or web address]  Amion Physician Scheduling and messaging for groups and whole hospitals  On call and physician scheduling software for group practices, residents, hospitalists and other medical providers for call, clinic, rotation and shift schedules. OnCall [employer] is a hospital-wide system for scheduling doctors and paging doctors on call. EasyPlot is for scientific plotting and data analysis.  [email or web address]  and use [place]'s universal password to access. If you do not have the password, please contact the hospital operator.  3. Locate the [facility] provider you are looking for under [facility] and page to a number that you can be directly reached. 4. If you still have difficulty reaching the provider, please page the [facility] (Director on Call) for the Hospitalists listed on amion for assistance.  04/15/2019, 8:19 PM

## 2019-04-16 DIAGNOSIS — Z885 Allergy status to narcotic agent status: Secondary | ICD-10-CM | POA: Diagnosis not present

## 2019-04-16 DIAGNOSIS — Z87891 Personal history of nicotine dependence: Secondary | ICD-10-CM | POA: Diagnosis not present

## 2019-04-16 DIAGNOSIS — Z820 Family history of epilepsy and other diseases of the nervous system: Secondary | ICD-10-CM | POA: Diagnosis not present

## 2019-04-16 DIAGNOSIS — E872 Acidosis: Secondary | ICD-10-CM | POA: Diagnosis present

## 2019-04-16 DIAGNOSIS — Z79899 Other long term (current) drug therapy: Secondary | ICD-10-CM | POA: Diagnosis not present

## 2019-04-16 DIAGNOSIS — D631 Anemia in chronic kidney disease: Secondary | ICD-10-CM | POA: Diagnosis present

## 2019-04-16 DIAGNOSIS — N189 Chronic kidney disease, unspecified: Secondary | ICD-10-CM | POA: Diagnosis not present

## 2019-04-16 DIAGNOSIS — A0811 Acute gastroenteropathy due to Norwalk agent: Secondary | ICD-10-CM | POA: Diagnosis present

## 2019-04-16 DIAGNOSIS — Z20822 Contact with and (suspected) exposure to covid-19: Secondary | ICD-10-CM | POA: Diagnosis present

## 2019-04-16 DIAGNOSIS — E876 Hypokalemia: Secondary | ICD-10-CM | POA: Diagnosis not present

## 2019-04-16 DIAGNOSIS — K529 Noninfective gastroenteritis and colitis, unspecified: Secondary | ICD-10-CM | POA: Diagnosis not present

## 2019-04-16 DIAGNOSIS — Z79890 Hormone replacement therapy (postmenopausal): Secondary | ICD-10-CM | POA: Diagnosis not present

## 2019-04-16 DIAGNOSIS — Z853 Personal history of malignant neoplasm of breast: Secondary | ICD-10-CM | POA: Diagnosis not present

## 2019-04-16 DIAGNOSIS — Z940 Kidney transplant status: Secondary | ICD-10-CM | POA: Diagnosis not present

## 2019-04-16 DIAGNOSIS — Z833 Family history of diabetes mellitus: Secondary | ICD-10-CM | POA: Diagnosis not present

## 2019-04-16 DIAGNOSIS — E039 Hypothyroidism, unspecified: Secondary | ICD-10-CM | POA: Diagnosis present

## 2019-04-16 DIAGNOSIS — Z808 Family history of malignant neoplasm of other organs or systems: Secondary | ICD-10-CM | POA: Diagnosis not present

## 2019-04-16 DIAGNOSIS — E861 Hypovolemia: Secondary | ICD-10-CM | POA: Diagnosis present

## 2019-04-16 DIAGNOSIS — E875 Hyperkalemia: Secondary | ICD-10-CM | POA: Diagnosis not present

## 2019-04-16 LAB — CBC
HCT: 24.9 % — ABNORMAL LOW (ref 36.0–46.0)
Hemoglobin: 7.7 g/dL — ABNORMAL LOW (ref 12.0–15.0)
MCH: 29.5 pg (ref 26.0–34.0)
MCHC: 30.9 g/dL (ref 30.0–36.0)
MCV: 95.4 fL (ref 80.0–100.0)
Platelets: 224 10*3/uL (ref 150–400)
RBC: 2.61 MIL/uL — ABNORMAL LOW (ref 3.87–5.11)
RDW: 19 % — ABNORMAL HIGH (ref 11.5–15.5)
WBC: 5.4 10*3/uL (ref 4.0–10.5)
nRBC: 0 % (ref 0.0–0.2)

## 2019-04-16 LAB — COMPREHENSIVE METABOLIC PANEL
ALT: 12 U/L (ref 0–44)
AST: 12 U/L — ABNORMAL LOW (ref 15–41)
Albumin: 2.3 g/dL — ABNORMAL LOW (ref 3.5–5.0)
Alkaline Phosphatase: 54 U/L (ref 38–126)
Anion gap: 9 (ref 5–15)
BUN: 8 mg/dL (ref 8–23)
CO2: 13 mmol/L — ABNORMAL LOW (ref 22–32)
Calcium: 5 mg/dL — CL (ref 8.9–10.3)
Chloride: 121 mmol/L — ABNORMAL HIGH (ref 98–111)
Creatinine, Ser: 1.14 mg/dL — ABNORMAL HIGH (ref 0.44–1.00)
GFR calc Af Amer: >60 mL/min (ref >60)
GFR calc non Af Amer: 52 mL/min — ABNORMAL LOW (ref >60)
Glucose, Bld: 71 mg/dL (ref 70–99)
Potassium: 2.9 mmol/L — ABNORMAL LOW (ref 3.5–5.1)
Sodium: 143 mmol/L (ref 135–145)
Total Bilirubin: 0.2 mg/dL — ABNORMAL LOW (ref 0.3–1.2)
Total Protein: 3.9 g/dL — ABNORMAL LOW (ref 6.5–8.1)

## 2019-04-16 LAB — FOLATE: Folate: 18.5 ng/mL (ref >5.9)

## 2019-04-16 LAB — GLUCOSE, CAPILLARY
Glucose-Capillary: 62 mg/dL — ABNORMAL LOW (ref 70–99)
Glucose-Capillary: 82 mg/dL (ref 70–99)

## 2019-04-16 LAB — FERRITIN: Ferritin: 47 ng/mL (ref 11–307)

## 2019-04-16 LAB — IRON AND TIBC
Iron: 18 ug/dL — ABNORMAL LOW (ref 28–170)
Saturation Ratios: 11 % (ref 10.4–31.8)
TIBC: 158 ug/dL — ABNORMAL LOW (ref 250–450)
UIBC: 140 ug/dL

## 2019-04-16 LAB — CALCIUM, IONIZED: Calcium, Ionized, Serum: <3 mg/dL — ABNORMAL LOW (ref 4.5–5.6)

## 2019-04-16 LAB — VITAMIN B12: Vitamin B-12: 178 pg/mL — ABNORMAL LOW (ref 180–914)

## 2019-04-16 LAB — MAGNESIUM: Magnesium: 0.5 mg/dL — CL (ref 1.7–2.4)

## 2019-04-16 LAB — HIV ANTIBODY (ROUTINE TESTING W REFLEX): HIV Screen 4th Generation wRfx: NONREACTIVE

## 2019-04-16 MED ORDER — LACTATED RINGERS IV SOLN: INTRAVENOUS | Status: AC

## 2019-04-16 MED ORDER — MAGNESIUM SULFATE 4 GM/100ML IV SOLN
4.0000 g | Freq: Once | INTRAVENOUS | Status: AC
  Administered 2019-04-16: 10:00:00 4 g via INTRAVENOUS
  Filled 2019-04-16: qty 100

## 2019-04-16 MED ORDER — MAGNESIUM SULFATE 2 GM/50ML IV SOLN
2.0000 g | Freq: Once | INTRAVENOUS | Status: AC
  Administered 2019-04-16: 06:00:00 2 g via INTRAVENOUS
  Filled 2019-04-16: qty 50

## 2019-04-16 MED ORDER — POTASSIUM CHLORIDE CRYS ER 20 MEQ PO TBCR
40.0000 meq | EXTENDED_RELEASE_TABLET | Freq: Two times a day (BID) | ORAL | Status: DC
  Administered 2019-04-16 – 2019-04-18 (×5): 40 meq via ORAL
  Filled 2019-04-16 (×5): qty 2

## 2019-04-16 MED ORDER — SODIUM BICARBONATE 8.4 % IV SOLN: INTRAVENOUS | Status: DC

## 2019-04-16 MED ORDER — SODIUM BICARBONATE 8.4 % IV SOLN
INTRAVENOUS | Status: DC
  Filled 2019-04-16: qty 150

## 2019-04-16 MED ORDER — CALCIUM GLUCONATE-NACL 1-0.675 GM/50ML-% IV SOLN
1.0000 g | Freq: Once | INTRAVENOUS | Status: AC
  Administered 2019-04-16: 05:00:00 1000 mg via INTRAVENOUS
  Filled 2019-04-16: qty 50

## 2019-04-16 MED ORDER — SODIUM BICARBONATE-DEXTROSE 150-5 MEQ/L-% IV SOLN
150.0000 meq | INTRAVENOUS | Status: DC
  Filled 2019-04-16: qty 1000

## 2019-04-16 MED ORDER — POTASSIUM CHLORIDE CRYS ER 20 MEQ PO TBCR
40.0000 meq | EXTENDED_RELEASE_TABLET | Freq: Once | ORAL | Status: AC
  Administered 2019-04-16: 10:00:00 40 meq via ORAL
  Filled 2019-04-16: qty 2

## 2019-04-16 MED ORDER — CALCIUM GLUCONATE-NACL 1-0.675 GM/50ML-% IV SOLN
1.0000 g | Freq: Once | INTRAVENOUS | Status: AC
  Administered 2019-04-16: 13:00:00 1000 mg via INTRAVENOUS
  Filled 2019-04-16: qty 50

## 2019-04-16 MED ORDER — WHITE PETROLATUM EX OINT
TOPICAL_OINTMENT | CUTANEOUS | Status: AC
  Filled 2019-04-16: qty 28.35

## 2019-04-16 NOTE — Progress Notes (Signed)
PROGRESS NOTE    Vanessa [name]  OIZ:[id number] DOB: [birth year] DOA: 04/15/2019 PCP: [name], MD    Brief Narrative:  HPI per Dr. Olen Cordial Monacelli is a 59 y.o. female with medical history significant of Kidney transplant many years ago, BRCA in remission, HTN.  Pt has been having diarrhea since Nov.  Pt being worked up by [facility] for this.  Had positive GI pathogen pnl for norovirus in Jan, remainder of work up neg thus far but ongoing.  Pt sent in to ED today due to abnormal labs with nephrology today: low calcium and low bicarb.   ED Course: Calcium 5.0, Mg 0.5, bicarb 13, Creat 1.2 (baseline).  2g calcium, 2g magnesium in ED, hospitalist asked to admit.  Nephro consulted  Assessment & Plan:   Principal Problem:   Hypomagnesemia Active Problems:   History of left breast cancer   Anemia in chronic renal disease   H/O kidney transplant   Hypertension   Chronic diarrhea   Hypocalcemia   Metabolic acidosis, NAG, bicarbonate losses   Nucleic acid assay by PCR positive for norovirus  1 hypomagnesemia/hypocalcemia/hypokalemia/non-anion gap metabolic acidosis Likely secondary to GI loss GI losses from ongoing diarrhea.  Magnesium noted to be 0.5.  We will give 4 g IV magnesium x1.  Repeat magnesium in the morning.  Changed IV fluids to bicarb drip however this has been discontinued and patient placed on LR per nephrology.  Patient started on oral bicarb tablets per nephrology.. Status post IV calcium gluconate which has been ordered.  Continue oral calcium carbonate 3 times daily, calcitriol.  Nephrology following and appreciate input and recommendations.  2.  Chronic diarrhea Being worked up in the outpatient setting by GI.  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  5 HIAA currently pending.  GI pathogen panel and stool studies pending.  If stools studies positive for norovirus will need IDs  involvement.  Imodium as needed.  3.  Status post kidney transplant Some improvement with creatinine on IV fluid hydration.  Continue gentle hydration.  Continue, prednisone, Prograf.  Nephrology following.  4.  Hypothyroidism Continue home dose Synthroid.  5.  Anemia of chronic disease/vitamin B12 deficiency Anemia panel consistent with anemia of chronic disease with iron level of 18, TIBC of 158, folate of 18.5, vitamin B12 levels of 178.  Patient has been started on oral vitamin B12 supplementation.  Follow H&H.  Transfusion threshold hemoglobin less than 7.  6.  Hypertension Atenolol  7.  History of BRCA In remission.  Outpatient follow-up.   DVT prophylaxis: Lovenox Code Status: Full Family Communication: Updated patient.  No family at bedside. Disposition Plan:  . Patient came from: Home            . Anticipated d/c place: Home . Barriers to d/c OR conditions which need to be met to effect a safe d/c: Home when clinically improved, electrolytes corrected, work-up completed and when cleared by nephrology.   Consultants:   Nephrology: Dr. Hollie Salk 04/16/2019  Procedures:  None  Antimicrobials:   None   Subjective: Patient sitting up at bedside eating her lunch.  Denies chest pain or shortness of breath.  States she feels a little bit better than on admission.  Patient states had 2 soft stools this morning already.  Objective: Vitals:   04/15/19 2119 04/16/19 0134 04/16/19 0456 04/16/19 1036  BP: 117/76 110/84 115/74 119/82  Pulse: 73 75 73 72  Resp: 17  _0 Temp: 98.4 F (36.9 C) 98.1 F (36.7 C) 98 F (36.7 C) 98 F (36.7 C)  TempSrc: Oral Oral Oral Oral  SpO2: 100% 93% 98% 100%  Weight: 41.9 kg     Height: 5' 2" (1.575 m)       Intake/Output Summary (Last 24 hours) at 04/16/2019 1248 Last data filed at 04/16/2019 1115 Gross per 24 hour  Intake 852.33 ml  Output 350 ml  Net 502.33 ml   Filed Weights   04/15/19 1517 04/15/19 2119  Weight: 42.6  kg 41.9 kg    Examination:  General exam: NAD. Respiratory system: Clear to auscultation. Respiratory effort normal. Cardiovascular system: S1 & S2 heard, RRR. No JVD, murmurs, rubs, gallops or clicks. No pedal edema. Gastrointestinal system: Abdomen is nondistended, soft and nontender. No organomegaly or masses felt. Normal bowel sounds heard. Central nervous system: Alert and oriented. No focal neurological deficits. Extremities: Symmetric 5 x 5 power. Skin: No rashes, lesions or ulcers Psychiatry: Judgement and insight appear normal. Mood & affect appropriate.     Data Reviewed: I have personally reviewed following labs and imaging studies  CBC: Recent Labs  Lab 04/15/19 1537 04/16/19 0305  WBC 7.0 5.4  HGB 8.5* 7.7*  HCT 27.8* 24.9*  MCV 96.9 95.4  PLT 255 992   Basic Metabolic Panel: Recent Labs  Lab 04/15/19 1537 04/15/19 1722 04/16/19 0305  NA 145  --  143  K 3.4*  --  2.9*  CL 124*  --  121*  CO2 13*  --  13*  GLUCOSE 86  --  71  BUN 8  --  8  CREATININE 1.22*  --  1.14*  CALCIUM 5.0*  --  5.0*  MG  --  0.5* 0.5*   GFR: Estimated Creatinine Clearance: 34.3 mL/min (A) (by C-G formula based on SCr of 1.14 mg/dL (H)). Liver Function Tests: Recent Labs  Lab 04/15/19 1537 04/16/19 0305  AST 13* 12*  ALT 14 12  ALKPHOS 63 54  BILITOT 0.3 0.2*  PROT 4.6* 3.9*  ALBUMIN 2.8* 2.3*   Recent Labs  Lab 04/15/19 1537  LIPASE 150*   No results for input(s): AMMONIA in the last 168 hours. Coagulation Profile: No results for input(s): INR, PROTIME in the last 168 hours. Cardiac Enzymes: No results for input(s): CKTOTAL, CKMB, CKMBINDEX, TROPONINI in the last 168 hours. BNP (last 3 results) No results for input(s): PROBNP in the last 8760 hours. HbA1C: No results for input(s): HGBA1C in the last 72 hours. CBG: Recent Labs  Lab 04/16/19 0515 04/16/19 0625  GLUCAP 62* 82   Lipid Profile: No results for input(s): CHOL, HDL, LDLCALC, TRIG, CHOLHDL,  LDLDIRECT in the last 72 hours. Thyroid Function Tests: No results for input(s): TSH, T4TOTAL, FREET4, T3FREE, THYROIDAB in the last 72 hours. Anemia Panel: Recent Labs    04/16/19 1049  VITAMINB12 178*  FOLATE 18.5  FERRITIN 47  TIBC 158*  IRON 18*   Sepsis Labs: No results for input(s): PROCALCITON, LATICACIDVEN in the last 168 hours.  Recent Results (from the past 240 hour(s))  SARS CORONAVIRUS 2 (TAT 6-24 HRS) Nasopharyngeal Nasopharyngeal Swab     Status: None   Collection Time: 04/15/19  6:01 PM   Specimen: Nasopharyngeal Swab  Result Value Ref Range Status   SARS Coronavirus 2 NEGATIVE NEGATIVE Final    Comment: (NOTE) SARS-CoV-2 target nucleic acids are NOT DETECTED. The SARS-CoV-2 RNA is generally detectable in upper and lower respiratory specimens during the  acute phase of infection. Negative results do not preclude SARS-CoV-2 infection, do not rule out co-infections with other pathogens, and should not be used as the sole basis for treatment or other patient management decisions. Negative results must be combined with clinical observations, patient history, and epidemiological information. The expected result is Negative. Fact Sheet for Patients: SugarRoll.be Fact Sheet for Healthcare Providers: https://www.woods-mathews.com/ This test is not yet approved or cleared by the [place] FDA and  has been authorized for detection and/or diagnosis of SARS-CoV-2 by FDA under an Emergency Use Authorization (EUA). This EUA will remain  in effect (meaning this test can be used) for the duration of the COVID-19 declaration under Section 56 4(b)(1) of the Act, 21 U.S.C. section 360bbb-3(b)(1), unless the authorization is terminated or revoked sooner. Performed at [facility], [place] [address] [place], [place] [address]          Radiology Studies: No results found.      Scheduled Meds: . atenolol  25 mg Oral  Daily  . calcitRIOL  0.25 mcg Oral Daily  . calcium carbonate  2 tablet Oral TID  . enoxaparin (LOVENOX) injection  30 mg Subcutaneous Q24H  . febuxostat  40 mg Oral Daily  . ferrous sulfate  325 mg Oral TID WC  . hydrocortisone  25 mg Rectal BID  . levothyroxine  137 mcg Oral QAC breakfast  . multivitamin with minerals   Oral Q breakfast  . mycophenolate  1,000 mg Oral Q24H  . mycophenolate  500 mg Oral Q24H  . omega-3 acid ethyl esters  1 g Oral Q breakfast  . potassium chloride SA  40 mEq Oral BID  . predniSONE  5 mg Oral Q breakfast  . rifaximin  550 mg Oral BID  . sodium bicarbonate  1,300 mg Oral TID  . tacrolimus  1 mg Oral BID  . cyanocobalamin  2,000 mcg Oral Daily  . [START ON 04/17/2019] Vitamin D (Ergocalciferol)  50,000 Units Oral Q Sun   Continuous Infusions: . calcium gluconate    . lactated ringers 75 mL/hr at 04/16/19 1151     LOS: 0 days    Time spent: 40 minutes    Irine Seal, MD [facility]   To contact the attending provider between 7A-7P or the covering provider during after hours 7P-7A, please log into the web site [email or web address] and access using universal [place] password for that web site. If you do not have the password, please call the hospital operator.  04/16/2019, 12:48 PM

## 2019-04-16 NOTE — Consult Note (Signed)
Vanessa [name]  HISTORY AND PHYSICAL  Vanessa [name] is an 59 y.o. female.    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 Grandville Silos for eval and recs re: IS management and electolyte disturbances.    Pt has had chronic diarrhea since Thanksgiving.  Has caused hypovolemia/ hypoK, hyperchloremic NAGMA, hypocalcemia, and hypomagnesemia.  She has had extensive workup so far, follows with Dr. Hart Carwin at [facility].  Her transplant is from her mother.  Cr is baseline 1.1-1.2.  Had labs done at our office and Ca was down to 5, was advised to come to the ED for IVFs and rx.    She is feeling better this AM.  On pred 5, Cellcept 1000/ 500, and prograf 1 BID.  Still has hypomagnesemia, hypoK, hypocalcemia.  Getting 24 hr urine test for carcinoid.    PMH: Past Medical History:  Diagnosis Date  . Anemia in chronic kidney disease(285.21) 03/16/2013  . Brain aneurysm 03/16/2013   Age 48 (1983)  . H/O kidney transplant 03/16/2013  . History of left breast cancer 03/16/2013  . Hx gestational diabetes 03/16/2013  . Hx of primary hypertension 03/16/2013   PSH: Past Surgical History:  Procedure Laterality Date  . BREAST EXCISIONAL BIOPSY Left    Past Medical History:  Diagnosis Date  . Anemia in chronic kidney disease(285.21) 03/16/2013  . Brain aneurysm 03/16/2013   Age 48 (1983)  . H/O kidney transplant 03/16/2013  . History of left breast cancer 03/16/2013  . Hx gestational diabetes 03/16/2013  . Hx of primary hypertension 03/16/2013    Medications:   Scheduled: . atenolol  25 mg Oral Daily  . calcitRIOL  0.25 mcg Oral Daily  . calcium carbonate  2 tablet Oral TID  . enoxaparin (LOVENOX) injection  30 mg Subcutaneous Q24H  . febuxostat  40 mg Oral Daily  . ferrous sulfate  325 mg Oral TID WC  .  hydrocortisone  25 mg Rectal BID  . levothyroxine  137 mcg Oral QAC breakfast  . multivitamin with minerals   Oral Q breakfast  . mycophenolate  1,000 mg Oral Q24H  . mycophenolate  500 mg Oral Q24H  . omega-3 acid ethyl esters  1 g Oral Q breakfast  . potassium chloride SA  40 mEq Oral BID  . predniSONE  5 mg Oral Q breakfast  . rifaximin  550 mg Oral BID  . sodium bicarbonate  1,300 mg Oral TID  . tacrolimus  1 mg Oral BID  . cyanocobalamin  2,000 mcg Oral Daily  . [START ON 04/17/2019] Vitamin D (Ergocalciferol)  50,000 Units Oral Q Sun    Medications Prior to Admission  Medication Sig Dispense Refill  . acetaminophen (TYLENOL) 500 MG tablet Take 1,000 mg by mouth every 6 (six) hours as needed for mild pain, moderate pain or headache.     Marland Kitchen amoxicillin (AMOXIL) 500 MG capsule Take 2,000 mg by mouth See admin instructions. Take 2,000 mg by mouth one hour prior to all dental cleanings and other procedures  3  . atenolol (TENORMIN) 25 MG tablet Take 25 mg by mouth daily.     . calcitRIOL (ROCALTROL) 0.25 MCG capsule Take 0.25 mcg by mouth daily.    . colchicine 0.6 MG tablet Take 2 now and 1 in 1  hour.  May repeat dose once daily.  For gout attack. (Patient taking differently: Take 0.6-1.2 mg by mouth See admin instructions. Take 1.2 mg by mouth at once, then 0.6 mg in one hour and may repeat once a day- as needed for gout flares) 12 tablet 0  . CVS VITAMIN B12 1000 MCG tablet Take 2,000 mcg by mouth daily.  12  . ferrous sulfate 325 (65 FE) MG EC tablet Take 325 mg by mouth 3 (three) times daily with meals.    . hydrocortisone (ANUSOL-HC) 25 MG suppository Place 25 mg rectally 2 (two) times daily. am    . loperamide (IMODIUM A-D) 2 MG tablet Take 2 mg by mouth 3 (three) times daily as needed for diarrhea or loose stools.    . Multiple Vitamins-Minerals (ONE-A-DAY WOMENS 50+ ADVANTAGE PO) Take 1 tablet by mouth daily with breakfast.    . mycophenolate (CELLCEPT) 500 MG tablet Take 500-1,000  mg by mouth 2 (two) times daily. Take 1,000 mg by mouth at 8 AM and 500 mg at 8 PM    . Omega-3 1000 MG CAPS Take 1,000 mg by mouth daily with breakfast.    . ondansetron (ZOFRAN ODT) 4 MG disintegrating tablet Take 1 tablet (4 mg total) by mouth every 8 (eight) hours as needed for nausea or vomiting. (Patient taking differently: Take 4 mg by mouth every 8 (eight) hours as needed for nausea or vomiting (DISSOLVE ORALLY). ) 20 tablet 0  . pantoprazole (PROTONIX) 40 MG tablet Take 40 mg by mouth daily before breakfast.    . potassium chloride SA (KLOR-CON) 20 MEQ tablet Take 20 mEq by mouth 2 (two) times daily.    . predniSONE (DELTASONE) 5 MG tablet Take 5 mg by mouth daily with breakfast.    . rifaximin (XIFAXAN) 550 MG TABS tablet Take 550 mg by mouth 2 (two) times daily.    . sodium bicarbonate 650 MG tablet Take 1,300 mg by mouth 3 (three) times daily.    Marland Kitchen SYNTHROID 137 MCG tablet Take 137 mcg by mouth daily before breakfast.     . tacrolimus (PROGRAF) 1 MG capsule Take 1 mg by mouth in the morning and at bedtime.     Marland Kitchen ULORIC 40 MG tablet Take 40 mg by mouth daily.     . Vitamin D, Ergocalciferol, (DRISDOL) 50000 units CAPS capsule Take 50,000 Units by mouth every Sunday.   0  . Zinc Oxide (DESITIN RAPID RELIEF EX) Apply 1 application topically See admin instructions. Apply externally to rectum after each loose bowel episode    . furosemide (LASIX) 20 MG tablet Take 20 mg by mouth daily as needed for fluid or edema. Reported on 07/12/2015      ALLERGIES:   Allergies  Allergen Reactions  . Codeine Other (See Comments)    Hallucinations and "la[place]la land"    FAM HX: Family History  Problem Relation Age of Onset  . Brain cancer Mother   . Diabetes type II Father   . Parkinson's disease Father     Social History:   reports that she has quit smoking. Her smoking use included cigarettes. She has a 1.00 pack-year smoking history. She has never used smokeless tobacco. She reports that she  does not drink alcohol or use drugs.  ROS: ROS: all other systems reviewed and are negative except as per HPI  Blood pressure 119/82, pulse 72, temperature 98 F (36.7 C), temperature source Oral, resp. rate 16, height 5\' 2"  (1.575 m), weight  41.9 kg, last menstrual period 01/16/2012, SpO2 100 %. PHYSICAL EXAM: Physical Exam  GEN: thin, NAD, sitting in bed HEENT EOMI PERRL NECK no JVD PULM clear bilaterally CV RRR ABD nontender NABS EXT no LE edema NEURO AAO x3 nonfocal SKIN warm and dry MSK no effusions   Results for orders placed or performed during the hospital encounter of 04/15/19 (from the past 48 hour(s))  Lipase, blood     Status: Abnormal   Collection Time: 04/15/19  3:37 PM  Result Value Ref Range   Lipase 150 (H) 11 - 51 U/L    Comment: Performed at [facility], [place] [address], [place], [place] [address]  Comprehensive metabolic panel     Status: Abnormal   Collection Time: 04/15/19  3:37 PM  Result Value Ref Range   Sodium 145 135 - 145 mmol/L   Potassium 3.4 (L) 3.5 - 5.1 mmol/L   Chloride 124 (H) 98 - 111 mmol/L   CO2 13 (L) 22 - 32 mmol/L   Glucose, Bld 86 70 - 99 mg/dL    Comment: Glucose reference range applies only to samples taken after fasting for at least 8 hours.   BUN 8 8 - 23 mg/dL   Creatinine, Ser 1.22 (H) 0.44 - 1.00 mg/dL   Calcium 5.0 (LL) 8.9 - 10.3 mg/dL    Comment: CRITICAL RESULT CALLED TO, READ BACK BY AND VERIFIED WITH: K FIELDS,RN 1624 04/15/2019 D BRADLEY    Total Protein 4.6 (L) 6.5 - 8.1 g/dL   Albumin 2.8 (L) 3.5 - 5.0 g/dL   AST 13 (L) 15 - 41 U/L   ALT 14 0 - 44 U/L   Alkaline Phosphatase 63 38 - 126 U/L   Total Bilirubin 0.3 0.3 - 1.2 mg/dL   GFR calc non Af Amer 48 (L) >60 mL/min   GFR calc Af Amer 55 (L) >60 mL/min   Anion gap 8 5 - 15    Comment: Performed at [facility], [place] [address], [place], [place] [address]  CBC     Status: Abnormal   Collection Time: 04/15/19  3:37 PM  Result Value Ref Range   WBC  7.0 4.0 - 10.5 K/uL   RBC 2.87 (L) 3.87 - 5.11 MIL/uL   Hemoglobin 8.5 (L) 12.0 - 15.0 g/dL   HCT 27.8 (L) 36.0 - 46.0 %   MCV 96.9 80.0 - 100.0 fL   MCH 29.6 26.0 - 34.0 pg   MCHC 30.6 30.0 - 36.0 g/dL   RDW 19.0 (H) 11.5 - 15.5 %   Platelets 255 150 - 400 K/uL   nRBC 0.0 0.0 - 0.2 %    Comment: Performed at [facility] [address] [place],  [address]  Urinalysis, Routine w reflex microscopic     Status: Abnormal   Collection Time: 04/15/19  4:33 PM  Result Value Ref Range   Color, Urine YELLOW YELLOW   APPearance CLEAR CLEAR   Specific Gravity, Urine 1.013 1.005 - 1.030   pH 5.0 5.0 - 8.0   Glucose, UA NEGATIVE NEGATIVE mg/dL   Hgb urine dipstick NEGATIVE NEGATIVE   Bilirubin Urine NEGATIVE NEGATIVE   Ketones, ur NEGATIVE NEGATIVE mg/dL   Protein, ur 30 (A) NEGATIVE mg/dL   Nitrite NEGATIVE NEGATIVE   Leukocytes,Ua TRACE (A) NEGATIVE   RBC / HPF 0-5 0 - 5 RBC/hpf   WBC, UA 0-5 0 - 5 WBC/hpf   Bacteria, UA RARE (A) NONE SEEN  Squamous Epithelial / LPF 0-5 0 - 5   Mucus PRESENT    Hyaline Casts, UA PRESENT     Comment: Performed at [facility], [place] [address], [place], [place] [address]  Magnesium     Status: Abnormal   Collection Time: 04/15/19  5:22 PM  Result Value Ref Range   Magnesium 0.5 (LL) 1.7 - 2.4 mg/dL    Comment: CRITICAL RESULT CALLED TO, READ BACK BY AND VERIFIED WITHDorrene German 1757 04/15/2019 D BRADLEY Performed at [facility] [address] [place], [place] [address]   SARS CORONAVIRUS 2 (TAT 6-24 HRS) Nasopharyngeal Nasopharyngeal Swab     Status: None   Collection Time: 04/15/19  6:01 PM   Specimen: Nasopharyngeal Swab  Result Value Ref Range   SARS Coronavirus 2 NEGATIVE NEGATIVE    Comment: (NOTE) SARS-CoV-2 target nucleic acids are NOT DETECTED. The SARS-CoV-2 RNA is generally detectable in upper and lower respiratory specimens during the acute phase of infection. Negative results do not preclude  SARS-CoV-2 infection, do not rule out co-infections with other pathogens, and should not be used as the sole basis for treatment or other patient management decisions. Negative results must be combined with clinical observations, patient history, and epidemiological information. The expected result is Negative. Fact Sheet for Patients: SugarRoll.be Fact Sheet for Healthcare Providers: https://www.woods-mathews.com/ This test is not yet approved or cleared by the [place] FDA and  has been authorized for detection and/or diagnosis of SARS-CoV-2 by FDA under an Emergency Use Authorization (EUA). This EUA will remain  in effect (meaning this test can be used) for the duration of the COVID-19 declaration under Section 56 4(b)(1) of the Act, 21 U.S.C. section 360bbb-3(b)(1), unless the authorization is terminated or revoked sooner. Performed at [facility], [place] [address], [place], [place] [address]   HIV Antibody (routine testing w rflx)     Status: None   Collection Time: 04/16/19  3:05 AM  Result Value Ref Range   HIV Screen 4th Generation wRfx NON REACTIVE NON REACTIVE    Comment: Performed at [facility] [address], [place], [place] [address]  Magnesium     Status: Abnormal   Collection Time: 04/16/19  3:05 AM  Result Value Ref Range   Magnesium 0.5 (LL) 1.7 - 2.4 mg/dL    Comment: CRITICAL RESULT CALLED TO, READ BACK BY AND VERIFIED WITHKonrad Penta [id number] 04/16/2019 [facility] Performed at [facility], [place] [address], [place], [place] [address]   CBC     Status: Abnormal   Collection Time: 04/16/19  3:05 AM  Result Value Ref Range   WBC 5.4 4.0 - 10.5 K/uL   RBC 2.61 (L) 3.87 - 5.11 MIL/uL   Hemoglobin 7.7 (L) 12.0 - 15.0 g/dL   HCT 24.9 (L) 36.0 - 46.0 %   MCV 95.4 80.0 - 100.0 fL   MCH 29.5 26.0 - 34.0 pg   MCHC 30.9 30.0 - 36.0 g/dL   RDW 19.0 (H) 11.5 - 15.5 %   Platelets 224 150 - 400 K/uL   nRBC  0.0 0.0 - 0.2 %    Comment: Performed at [facility], [place] [address], [place], [place] [address]  Comprehensive metabolic panel     Status: Abnormal   Collection Time: 04/16/19  3:05 AM  Result Value Ref Range   Sodium 143 135 - 145 mmol/L   Potassium 2.9 (L) 3.5 - 5.1 mmol/L  Chloride 121 (H) 98 - 111 mmol/L   CO2 13 (L) 22 - 32 mmol/L   Glucose, Bld 71 70 - 99 mg/dL    Comment: Glucose reference range applies only to samples taken after fasting for at least 8 hours.   BUN 8 8 - 23 mg/dL   Creatinine, Ser 1.14 (H) 0.44 - 1.00 mg/dL   Calcium 5.0 (LL) 8.9 - 10.3 mg/dL    Comment: CRITICAL RESULT CALLED TO, READ BACK BY AND VERIFIED WITH: N RAGAAS,RN 04/16/2019 0419 WILDERK    Total Protein 3.9 (L) 6.5 - 8.1 g/dL   Albumin 2.3 (L) 3.5 - 5.0 g/dL   AST 12 (L) 15 - 41 U/L   ALT 12 0 - 44 U/L   Alkaline Phosphatase 54 38 - 126 U/L   Total Bilirubin 0.2 (L) 0.3 - 1.2 mg/dL   GFR calc non Af Amer 52 (L) >60 mL/min   GFR calc Af Amer >60 >60 mL/min   Anion gap 9 5 - 15    Comment: Performed at [facility], [place] [address] [place], [place] [address]  Glucose, capillary     Status: Abnormal   Collection Time: 04/16/19  5:15 AM  Result Value Ref Range   Glucose-Capillary 62 (L) 70 - 99 mg/dL    Comment: Glucose reference range applies only to samples taken after fasting for at least 8 hours.  Glucose, capillary     Status: None   Collection Time: 04/16/19  6:25 AM  Result Value Ref Range   Glucose-Capillary 82 70 - 99 mg/dL    Comment: Glucose reference range applies only to samples taken after fasting for at least 8 hours.    No results found.  Assessment/Plan  1.  Kidney transplant: slight uptrend in Cr--> better with IVFs.  She looks hypovolemic.  Would not use a bicarb gtt in this pt with severe electrolyte disturbances- would use LR infusion for now.  Have ordered.  Continue IS, check tac trough      2.  Hypocalcemia: aggressive repletion with IV calcium  gluconate, TUMS TID, calcitriol  3.  Hypomagnesemia: aggressive repletion with IV mag-- agree with 4 g dosing, mag gluconate could still cause diarrhea so will hold off on that for now  4.  Hypokalemia:  Increase supp to 40 mEq BID  5.  Diarrhea: ongoing issue. Extensive workup.  Getting 24 hr urine collection for carcinoid.  Per pt, EGD and c-scope have been neg.  CMV PCR negative in our office 04/08/19.  ? Chronic norovirus.  Would recheck GI pathogen panel if high suspicion (can occur in renal transplants)--. This was positive in care everyhwere 02/16/19 so will check again.    Madelon Lips 04/16/2019, 11:36 AM

## 2019-04-16 NOTE — Plan of Care (Signed)
  Problem: Education: Goal: Knowledge of General Education information will improve Description: Including pain rating scale, medication(s)/side effects and non-pharmacologic comfort measures Outcome: Progressing   Problem: Health Behavior/Discharge Planning: Goal: Ability to manage health-related needs will improve Outcome: Progressing   Problem: Clinical Measurements: Goal: Ability to maintain clinical measurements within normal limits will improve Outcome: Progressing Goal: Diagnostic test results will improve Outcome: Progressing   Problem: Nutrition: Goal: Adequate nutrition will be maintained Outcome: Progressing   Problem: Elimination: Goal: Will not experience complications related to bowel motility Outcome: Progressing Goal: Will not experience complications related to urinary retention Outcome: Progressing   Problem: Pain Managment: Goal: General experience of comfort will improve Outcome: Progressing   Problem: Safety: Goal: Ability to remain free from injury will improve Outcome: Progressing   Problem: Skin Integrity: Goal: Risk for impaired skin integrity will decrease Outcome: Progressing

## 2019-04-17 LAB — CBC WITH DIFFERENTIAL/PLATELET
Abs Immature Granulocytes: 0.02 10*3/uL (ref 0.00–0.07)
Basophils Absolute: 0 10*3/uL (ref 0.0–0.1)
Basophils Relative: 0 %
Eosinophils Absolute: 0.2 10*3/uL (ref 0.0–0.5)
Eosinophils Relative: 3 %
HCT: 24.8 % — ABNORMAL LOW (ref 36.0–46.0)
Hemoglobin: 7.8 g/dL — ABNORMAL LOW (ref 12.0–15.0)
Immature Granulocytes: 0 %
Lymphocytes Relative: 9 %
Lymphs Abs: 0.5 10*3/uL — ABNORMAL LOW (ref 0.7–4.0)
MCH: 29.3 pg (ref 26.0–34.0)
MCHC: 31.5 g/dL (ref 30.0–36.0)
MCV: 93.2 fL (ref 80.0–100.0)
Monocytes Absolute: 0.3 10*3/uL (ref 0.1–1.0)
Monocytes Relative: 6 %
Neutro Abs: 4.5 10*3/uL (ref 1.7–7.7)
Neutrophils Relative %: 82 %
Platelets: 233 10*3/uL (ref 150–400)
RBC: 2.66 MIL/uL — ABNORMAL LOW (ref 3.87–5.11)
RDW: 18.3 % — ABNORMAL HIGH (ref 11.5–15.5)
WBC: 5.5 10*3/uL (ref 4.0–10.5)
nRBC: 0 % (ref 0.0–0.2)

## 2019-04-17 LAB — RENAL FUNCTION PANEL
Albumin: 2.3 g/dL — ABNORMAL LOW (ref 3.5–5.0)
Anion gap: 6 (ref 5–15)
BUN: 11 mg/dL (ref 8–23)
CO2: 17 mmol/L — ABNORMAL LOW (ref 22–32)
Calcium: 6.2 mg/dL — CL (ref 8.9–10.3)
Chloride: 114 mmol/L — ABNORMAL HIGH (ref 98–111)
Creatinine, Ser: 0.94 mg/dL (ref 0.44–1.00)
GFR calc Af Amer: >60 mL/min (ref >60)
GFR calc non Af Amer: >60 mL/min (ref >60)
Glucose, Bld: 80 mg/dL (ref 70–99)
Phosphorus: <1 mg/dL — CL (ref 2.5–4.6)
Potassium: 4.4 mmol/L (ref 3.5–5.1)
Sodium: 137 mmol/L (ref 135–145)

## 2019-04-17 LAB — MAGNESIUM: Magnesium: 1.8 mg/dL (ref 1.7–2.4)

## 2019-04-17 MED ORDER — SODIUM PHOSPHATES 45 MMOLE/15ML IV SOLN
30.0000 mmol | Freq: Once | INTRAVENOUS | Status: AC
  Administered 2019-04-17: 30 mmol via INTRAVENOUS
  Filled 2019-04-17: qty 10

## 2019-04-17 MED ORDER — CALCIUM GLUCONATE-NACL 2-0.675 GM/100ML-% IV SOLN
2.0000 g | Freq: Once | INTRAVENOUS | Status: AC
  Administered 2019-04-17: 16:00:00 2000 mg via INTRAVENOUS
  Filled 2019-04-17: qty 100

## 2019-04-17 MED ORDER — SODIUM PHOSPHATES 45 MMOLE/15ML IV SOLN
10.0000 mmol | Freq: Once | INTRAVENOUS | Status: AC
  Administered 2019-04-17: 10 mmol via INTRAVENOUS
  Filled 2019-04-17: qty 3.33

## 2019-04-17 MED ORDER — SODIUM CHLORIDE 0.9 % IV SOLN
510.0000 mg | Freq: Once | INTRAVENOUS | Status: AC
  Administered 2019-04-17: 16:00:00 510 mg via INTRAVENOUS
  Filled 2019-04-17: qty 17

## 2019-04-17 MED ORDER — MAGNESIUM SULFATE 2 GM/50ML IV SOLN
2.0000 g | Freq: Once | INTRAVENOUS | Status: AC
  Administered 2019-04-17: 10:00:00 2 g via INTRAVENOUS
  Filled 2019-04-17: qty 50

## 2019-04-17 NOTE — Progress Notes (Addendum)
[facility] Progress Note    Assessment/ Plan:    1.  Kidney transplant: Follows with Dr. Joelyn Oms.  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  slight uptrend in Cr 1.3, last OP Cr 1.2--> better with IVFs. Cr 0.9 today  She looks hypovolemic.  Would not use a bicarb gtt in this pt with severe electrolyte disturbances- would use LR infusion for now.  Have ordered.  Continue IS, check tac trough.  Would not expect excessive renal losses of K, Mg, Phos, Ca, bicarb given clinical picture but could be checked if high clinical suspicion (this would be essentially fanconi's syndrome which I don't have a high suspicion for).        2.  Hypocalcemia: aggressive repletion with IV calcium gluconate, TUMS TID, calcitriol  3.  Hypomagnesemia: aggressive repletion with IV mag-- agree with 4 g dosing, mag gluconate could still cause diarrhea so will hold off on that for now  4.  Hypokalemia:  Increase supp to 40 mEq BID  5.  Hypophosphatemia: getting sodium phos   6.  Diarrhea: ongoing issue. Extensive workup.  Getting 24 hr urine collection for carcinoid.  Per pt, EGD and c-scope have been neg.  CMV PCR negative in our office 04/08/19.  ? Chronic norovirus.  Would recheck GI pathogen panel if high suspicion (can occur in renal transplants)--. This was positive in care everyhwere 02/16/19 so will check again, pending.  7.  Anemia: Hgb 7.8, %sat 11, will do a dose of feraheme today  8.  Dispo: Pending    Subjective:    Cr down to 0.9.  Electrolytes are better- phos < 1.0, getting repleted.  Diarrhea continues.  Feeling much better.     Objective:   BP 118/76 (BP Location: Left Arm)   Pulse 69   Temp 97.7 F (36.5 C) (Oral)   Resp 14   Ht 5\' 2"  (1.575 m)   Wt 41.9 kg   LMP 01/16/2012   SpO2 99%   BMI 16.90 kg/m   Intake/Output Summary (Last 24 hours) at 04/17/2019 1059 Last data filed at 04/17/2019 1039 Gross per 24 hour   Intake 899.77 ml  Output 1800 ml  Net -900.23 ml   Weight change:   Physical Exam: GEN: thin, NAD, sitting in bed HEENT EOMI PERRL NECK no JVD PULM clear bilaterally CV RRR ABD nontender NABS EXT no LE edema NEURO AAO x3 nonfocal SKIN warm and dry MSK no effusions  Imaging: No results found.  Labs: BMET Recent Labs  Lab 04/15/19 1537 04/16/19 0305 04/17/19 0222  NA 145 143 137  K 3.4* 2.9* 4.4  CL 124* 121* 114*  CO2 13* 13* 17*  GLUCOSE 86 71 80  BUN 8 8 11   CREATININE 1.22* 1.14* 0.94  CALCIUM 5.0* 5.0* 6.2*  PHOS  --   --  <1.0*   CBC Recent Labs  Lab 04/15/19 1537 04/16/19 0305 04/17/19 0222  WBC 7.0 5.4 5.5  NEUTROABS  --   --  4.5  HGB 8.5* 7.7* 7.8*  HCT 27.8* 24.9* 24.8*  MCV 96.9 95.4 93.2  PLT 255 224 233    Medications:    . atenolol  25 mg Oral Daily  . calcitRIOL  0.25 mcg Oral Daily  . calcium carbonate  2 tablet Oral TID  . enoxaparin (LOVENOX) injection  30 mg Subcutaneous Q24H  . febuxostat  40 mg Oral Daily  .  hydrocortisone  25 mg Rectal BID  . levothyroxine  137 mcg Oral QAC breakfast  . multivitamin with minerals   Oral Q breakfast  . mycophenolate  1,000 mg Oral Q24H  . mycophenolate  500 mg Oral Q24H  . omega-3 acid ethyl esters  1 g Oral Q breakfast  . potassium chloride SA  40 mEq Oral BID  . predniSONE  5 mg Oral Q breakfast  . rifaximin  550 mg Oral BID  . sodium bicarbonate  1,300 mg Oral TID  . tacrolimus  1 mg Oral BID  . cyanocobalamin  2,000 mcg Oral Daily  . Vitamin D (Ergocalciferol)  50,000 Units Oral Q Sun  . white petrolatum          Madelon Lips, MD 04/17/2019, 10:59 AM

## 2019-04-17 NOTE — Plan of Care (Signed)

## 2019-04-17 NOTE — Progress Notes (Signed)
PROGRESS NOTE    Vanessa [name]  CZY:[id number] DOB: July 30, 1960 DOA: 04/15/2019 PCP: [name], MD    Brief Narrative:  HPI per Dr. Olen Cordial Alford is a 59 y.o. female with medical history significant of Kidney transplant many years ago, BRCA in remission, HTN.  Pt has been having diarrhea since Nov.  Pt being worked up by [facility] for this.  Had positive GI pathogen pnl for norovirus in Jan, remainder of work up neg thus far but ongoing.  Pt sent in to ED today due to abnormal labs with nephrology today: low calcium and low bicarb.   ED Course: Calcium 5.0, Mg 0.5, bicarb 13, Creat 1.2 (baseline).  2g calcium, 2g magnesium in ED, hospitalist asked to admit.  Nephro consulted  Assessment & Plan:   Principal Problem:   Hypomagnesemia Active Problems:   History of left breast cancer   Anemia in chronic renal disease   H/O kidney transplant   Hypertension   Chronic diarrhea   Hypocalcemia   Metabolic acidosis, NAG, bicarbonate losses   Nucleic acid assay by PCR positive for norovirus  1 hypomagnesemia/hypocalcemia/hypokalemia/hypophosphatemia/non-anion gap metabolic acidosis Likely secondary to GI loss GI losses from ongoing diarrhea.  Magnesium noted to be 0.5 on 04/16/2019 and currently at 1.8 today.  Phosphorus level < 1.0..  We will give 2 g IV magnesium x1.  Sodium phosphate 30 mmol IV x1.  Changed IV fluids to bicarb drip however this was discontinued and patient placed on LR per nephrology.  Patient started on oral bicarb tablets per nephrology.. Status post IV calcium gluconate. Continue oral calcium carbonate 3 times daily, calcitriol.  Nephrology following and appreciate input and recommendations.  2.  Chronic diarrhea Being worked up in the outpatient setting by GI.  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  5 HIAA currently pending.  GI pathogen panel and stool studies pending.  If  stools studies positive for norovirus will need IDs involvement.  Imodium as needed.  3.  Status post kidney transplant Some improvement with creatinine on IV fluid hydration.  Continue gentle hydration.  Continue, CellCept, prednisone, Prograf.  Per nephrology.   4.  Hypothyroidism Synthroid.  5.  Anemia of chronic disease/vitamin B12 deficiency Anemia panel consistent with anemia of chronic disease with iron level of 18, TIBC of 158, folate of 18.5, vitamin B12 levels of 178.  Patient has been started on oral vitamin B12 supplementation.  IV Feraheme ordered per nephrology.  Follow H&H.  Transfusion threshold hemoglobin less than 7.  6.  Hypertension Controlled on current regimen of atenolol  7.  History of BRCA In remission.  Outpatient follow-up.   DVT prophylaxis: Lovenox Code Status: Full Family Communication: Updated patient.  No family at bedside. Disposition Plan:   Patient came from: Home             Anticipated d/c place: Home  Barriers to d/c OR conditions which need to be met to effect a safe d/c: Home when clinically improved, electrolytes corrected, work-up completed and when cleared by nephrology.   Consultants:   Nephrology: Dr. Hollie Salk 04/16/2019  Procedures:  None  Antimicrobials:   None   Subjective: Patient sitting up on the side of the bed on the telephone.  Denies chest pain or shortness of breath.  Feeling better today.  Stated had a 4 soft bowel movements yesterday.  Has had 2 soft bowel movements today.  Diet improving per patient.  Objective:  Vitals:   04/16/19 1036 04/16/19 1535 04/16/19 2232 04/17/19 0522  BP: 119/82 111/68 100/74 118/76  Pulse: 72 70 64 69  Resp: _0 Temp: 98 F (36.7 C) 97.7 F (36.5 C) 98 F (36.7 C) 97.7 F (36.5 C)  TempSrc: Oral Oral Oral Oral  SpO2: 100% 100% 98% 99%  Weight:      Height:        Intake/Output Summary (Last 24 hours) at 04/17/2019 1218 Last data filed at 04/17/2019 1039 Gross per  24 hour  Intake 899.77 ml  Output 1600 ml  Net -700.23 ml   Filed Weights   04/15/19 1517 04/15/19 2119  Weight: 42.6 kg 41.9 kg    Examination:  General exam: NAD. Respiratory system: Lungs clear to auscultation bilaterally.  No wheezes, no crackles, no rhonchi.  Normal respiratory effort.   Cardiovascular system: Regular rate and rhythm no murmurs rubs or gallops.  No JVD.  No lower extremity edema. Gastrointestinal system: Abdomen is soft, nontender, nondistended, positive bowel sounds.  No rebound.  No guarding.  Central nervous system: Alert and oriented. No focal neurological deficits. Extremities: Symmetric 5 x 5 power. Skin: No rashes, lesions or ulcers Psychiatry: Judgement and insight appear normal. Mood & affect appropriate.     Data Reviewed: I have personally reviewed following labs and imaging studies  CBC: Recent Labs  Lab 04/15/19 1537 04/16/19 0305 04/17/19 0222  WBC 7.0 5.4 5.5  NEUTROABS  --   --  4.5  HGB 8.5* 7.7* 7.8*  HCT 27.8* 24.9* 24.8*  MCV 96.9 95.4 93.2  PLT 255 224 465   Basic Metabolic Panel: Recent Labs  Lab 04/15/19 1537 04/15/19 1722 04/16/19 0305 04/17/19 0222  NA 145  --  143 137  K 3.4*  --  2.9* 4.4  CL 124*  --  121* 114*  CO2 13*  --  13* 17*  GLUCOSE 86  --  71 80  BUN 8  --  8 11  CREATININE 1.22*  --  1.14* 0.94  CALCIUM 5.0*  --  5.0* 6.2*  MG  --  0.5* 0.5* 1.8  PHOS  --   --   --  <1.0*   GFR: Estimated Creatinine Clearance: 41.6 mL/min (by C-G formula based on SCr of 0.94 mg/dL). Liver Function Tests: Recent Labs  Lab 04/15/19 1537 04/16/19 0305 04/17/19 0222  AST 13* 12*  --   ALT 14 12  --   ALKPHOS 63 54  --   BILITOT 0.3 0.2*  --   PROT 4.6* 3.9*  --   ALBUMIN 2.8* 2.3* 2.3*   Recent Labs  Lab 04/15/19 1537  LIPASE 150*   No results for input(s): AMMONIA in the last 168 hours. Coagulation Profile: No results for input(s): INR, PROTIME in the last 168 hours. Cardiac Enzymes: No results  for input(s): CKTOTAL, CKMB, CKMBINDEX, TROPONINI in the last 168 hours. BNP (last 3 results) No results for input(s): PROBNP in the last 8760 hours. HbA1C: No results for input(s): HGBA1C in the last 72 hours. CBG: Recent Labs  Lab 04/16/19 0515 04/16/19 0625  GLUCAP 62* 82   Lipid Profile: No results for input(s): CHOL, HDL, LDLCALC, TRIG, CHOLHDL, LDLDIRECT in the last 72 hours. Thyroid Function Tests: No results for input(s): TSH, T4TOTAL, FREET4, T3FREE, THYROIDAB in the last 72 hours. Anemia Panel: Recent Labs    04/16/19 1049  VITAMINB12 178*  FOLATE 18.5  FERRITIN 47  TIBC 158*  IRON 18*  Sepsis Labs: No results for input(s): PROCALCITON, LATICACIDVEN in the last 168 hours.  Recent Results (from the past 240 hour(s))  SARS CORONAVIRUS 2 (TAT 6-24 HRS) Nasopharyngeal Nasopharyngeal Swab     Status: None   Collection Time: 04/15/19  6:01 PM   Specimen: Nasopharyngeal Swab  Result Value Ref Range Status   SARS Coronavirus 2 NEGATIVE NEGATIVE Final    Comment: (NOTE) SARS-CoV-2 target nucleic acids are NOT DETECTED. The SARS-CoV-2 RNA is generally detectable in upper and lower respiratory specimens during the acute phase of infection. Negative results do not preclude SARS-CoV-2 infection, do not rule out co-infections with other pathogens, and should not be used as the sole basis for treatment or other patient management decisions. Negative results must be combined with clinical observations, patient history, and epidemiological information. The expected result is Negative. Fact Sheet for Patients: SugarRoll.be Fact Sheet for Healthcare Providers: https://www.woods-mathews.com/ This test is not yet approved or cleared by the [place] FDA and  has been authorized for detection and/or diagnosis of SARS-CoV-2 by FDA under an Emergency Use Authorization (EUA). This EUA will remain  in effect (meaning this test can be  used) for the duration of the COVID-19 declaration under Section 56 4(b)(1) of the Act, 21 U.S.C. section 360bbb-3(b)(1), unless the authorization is terminated or revoked sooner. Performed at [facility], [place] [address], [place], [place] [address]          Radiology Studies: No results found.      Scheduled Meds:  atenolol  25 mg Oral Daily   calcitRIOL  0.25 mcg Oral Daily   calcium carbonate  2 tablet Oral TID   enoxaparin (LOVENOX) injection  30 mg Subcutaneous Q24H   febuxostat  40 mg Oral Daily   hydrocortisone  25 mg Rectal BID   levothyroxine  137 mcg Oral QAC breakfast   multivitamin with minerals   Oral Q breakfast   mycophenolate  1,000 mg Oral Q24H   mycophenolate  500 mg Oral Q24H   omega-3 acid ethyl esters  1 g Oral Q breakfast   potassium chloride SA  40 mEq Oral BID   predniSONE  5 mg Oral Q breakfast   rifaximin  550 mg Oral BID   sodium bicarbonate  1,300 mg Oral TID   tacrolimus  1 mg Oral BID   cyanocobalamin  2,000 mcg Oral Daily   Vitamin D (Ergocalciferol)  50,000 Units Oral Q Sun   Continuous Infusions:  calcium gluconate     ferumoxytol     sodium phosphate  Dextrose 5% IVPB 10 mmol (04/17/19 0619)   sodium phosphate  Dextrose 5% IVPB 30 mmol (04/17/19 0940)     LOS: 1 day    Time spent: 40 minutes    Irine Seal, MD [facility]   To contact the attending provider between 7A-7P or the covering provider during after hours 7P-7A, please log into the web site [email or web address] and access using universal [place] password for that web site. If you do not have the password, please call the hospital operator.  04/17/2019, 12:18 PM

## 2019-04-17 NOTE — Progress Notes (Signed)
Lab called with Critical lab results.  Lab reports that Calcium level is 6.2 and Phosphorus level is less than 1.0.  Provider [place] notified.  Patient is sleeping in bed.  No acute distress noted.

## 2019-04-17 NOTE — Plan of Care (Signed)

## 2019-04-18 DIAGNOSIS — K529 Noninfective gastroenteritis and colitis, unspecified: Secondary | ICD-10-CM | POA: Diagnosis not present

## 2019-04-18 DIAGNOSIS — Z940 Kidney transplant status: Secondary | ICD-10-CM | POA: Diagnosis not present

## 2019-04-18 DIAGNOSIS — E876 Hypokalemia: Secondary | ICD-10-CM | POA: Diagnosis not present

## 2019-04-18 DIAGNOSIS — N189 Chronic kidney disease, unspecified: Secondary | ICD-10-CM | POA: Diagnosis not present

## 2019-04-18 LAB — RENAL FUNCTION PANEL
Albumin: 2.4 g/dL — ABNORMAL LOW (ref 3.5–5.0)
Anion gap: 6 (ref 5–15)
BUN: 7 mg/dL — ABNORMAL LOW (ref 8–23)
CO2: 19 mmol/L — ABNORMAL LOW (ref 22–32)
Calcium: 6.8 mg/dL — ABNORMAL LOW (ref 8.9–10.3)
Chloride: 113 mmol/L — ABNORMAL HIGH (ref 98–111)
Creatinine, Ser: 0.81 mg/dL (ref 0.44–1.00)
GFR calc Af Amer: >60 mL/min (ref >60)
GFR calc non Af Amer: >60 mL/min (ref >60)
Glucose, Bld: 78 mg/dL (ref 70–99)
Phosphorus: 1.6 mg/dL — ABNORMAL LOW (ref 2.5–4.6)
Potassium: 4.5 mmol/L (ref 3.5–5.1)
Sodium: 138 mmol/L (ref 135–145)

## 2019-04-18 LAB — CBC WITH DIFFERENTIAL/PLATELET
Abs Immature Granulocytes: 0.02 10*3/uL (ref 0.00–0.07)
Basophils Absolute: 0 10*3/uL (ref 0.0–0.1)
Basophils Relative: 0 %
Eosinophils Absolute: 0.2 10*3/uL (ref 0.0–0.5)
Eosinophils Relative: 3 %
HCT: 25.7 % — ABNORMAL LOW (ref 36.0–46.0)
Hemoglobin: 8 g/dL — ABNORMAL LOW (ref 12.0–15.0)
Immature Granulocytes: 0 %
Lymphocytes Relative: 11 %
Lymphs Abs: 0.5 10*3/uL — ABNORMAL LOW (ref 0.7–4.0)
MCH: 29.6 pg (ref 26.0–34.0)
MCHC: 31.1 g/dL (ref 30.0–36.0)
MCV: 95.2 fL (ref 80.0–100.0)
Monocytes Absolute: 0.4 10*3/uL (ref 0.1–1.0)
Monocytes Relative: 7 %
Neutro Abs: 3.9 10*3/uL (ref 1.7–7.7)
Neutrophils Relative %: 79 %
Platelets: 251 10*3/uL (ref 150–400)
RBC: 2.7 MIL/uL — ABNORMAL LOW (ref 3.87–5.11)
RDW: 18.6 % — ABNORMAL HIGH (ref 11.5–15.5)
WBC: 4.9 10*3/uL (ref 4.0–10.5)
nRBC: 0 % (ref 0.0–0.2)

## 2019-04-18 LAB — GI PATHOGEN PANEL BY PCR, STOOL

## 2019-04-18 LAB — MAGNESIUM: Magnesium: 1.7 mg/dL (ref 1.7–2.4)

## 2019-04-18 MED ORDER — LIP MEDEX EX OINT
TOPICAL_OINTMENT | CUTANEOUS | Status: DC | PRN
  Filled 2019-04-18: qty 7

## 2019-04-18 MED ORDER — SODIUM PHOSPHATES 45 MMOLE/15ML IV SOLN
30.0000 mmol | Freq: Once | INTRAVENOUS | Status: AC
  Administered 2019-04-18: 10:00:00 30 mmol via INTRAVENOUS
  Filled 2019-04-18: qty 10

## 2019-04-18 MED ORDER — MAGNESIUM SULFATE 4 GM/100ML IV SOLN
4.0000 g | Freq: Once | INTRAVENOUS | Status: AC
  Administered 2019-04-18: 4 g via INTRAVENOUS
  Filled 2019-04-18: qty 100

## 2019-04-18 NOTE — Progress Notes (Signed)
Stool is soft and brown

## 2019-04-18 NOTE — Progress Notes (Signed)
PROGRESS NOTE    Vanessa [name]  HQP:[id number] DOB: 1960-05-22 DOA: 04/15/2019 PCP: [name], MD    Brief Narrative:  HPI per Dr. Olen Vanessa [name] is a 59 y.o. female with medical history significant of Kidney transplant many years ago, BRCA in remission, HTN.  Pt has been having diarrhea since Nov.  Pt being worked up by [facility] for this.  Had positive GI pathogen pnl for norovirus in Jan, remainder of work up neg thus far but ongoing.  Pt sent in to ED today due to abnormal labs with nephrology today: low calcium and low bicarb.   ED Course: Calcium 5.0, Mg 0.5, bicarb 13, Creat 1.2 (baseline).  2g calcium, 2g magnesium in ED, hospitalist asked to admit.  Nephro consulted  Assessment & Plan:   Principal Problem:   Hypomagnesemia Active Problems:   History of left breast cancer   Anemia in chronic renal disease   H/O kidney transplant   Hypertension   Chronic diarrhea   Hypocalcemia   Metabolic acidosis, NAG, bicarbonate losses   Nucleic acid assay by PCR positive for norovirus  1.hypomagnesemia/hypocalcemia/hypokalemia/hypophosphatemia/non-anion gap metabolic acidosis Likely secondary to GI loss GI losses from ongoing diarrhea.  Magnesium noted to be 0.5 on 04/16/2019 and currently at 1.7 today.  Phosphorus level < 1.6..  We will give 4 g IV magnesium x1.  Sodium phosphate 30 mmol IV x1.  Changed IV fluids to bicarb drip however this was discontinued and patient placed on LR per nephrology.  IV fluids have been saline lock.  Patient started on oral bicarb tablets per nephrology.. Status post IV calcium gluconate. Continue oral calcium carbonate 3 times daily, calcitriol.  Nephrology following and appreciate input and recommendations.  2.  Chronic diarrhea Being worked up in the outpatient setting by GI.  Some improvement with consistency of stools.  Concern for chronic norovirus in immunocompromised patient as the cause of patient's chronic  diarrhea.  5 HIAA currently pending.  GI pathogen panel and stool studies pending.  If stools studies positive for norovirus will need IDs involvement.  Imodium as needed.  3.  Status post kidney transplant Improved.  Creatinine currently at 0.81.  IV fluids have been discontinued and will monitor renal function off of IV fluids for the next 24 hours. Continue, CellCept, prednisone, Prograf.  Per nephrology.   4.  Hypothyroidism Continue Synthroid.  5.  Anemia of chronic disease/vitamin B12 deficiency Anemia panel consistent with anemia of chronic disease with iron level of 18, TIBC of 158, folate of 18.5, vitamin B12 levels of 178.  Patient has been started on oral vitamin B12 supplementation.  Status post IV Feraheme.  Hemoglobin stable at 8.0.  Transfusion threshold hemoglobin < 7.  Continue H/H.  6.  Hypertension Continue atenolol.  7.  History of BRCA In remission.  Outpatient follow-up.   DVT prophylaxis: Lovenox Code Status: Full Family Communication: Updated patient.  No family at bedside. Disposition Plan:   Patient came from: Home             Anticipated d/c place: Home  Barriers to d/c OR conditions which need to be met to effect a safe d/c: Home when clinically improved, electrolytes corrected, work-up completed and when cleared by nephrology.   Consultants:   Nephrology: Dr. Hollie [name] 04/16/2019  Procedures:  None  Antimicrobials:   None   Subjective: Patient walking out of the bathroom.  Denies any chest pain or shortness of breath.  Feeling better than she did yesterday.  Stated had some watery stool yesterday after she ate a salad and feels might be from that.  Feels stool consistency has improved a little bit more today and more softer.   Objective: Vitals:   04/17/19 0522 04/17/19 1518 04/17/19 2012 04/18/19 0536  BP: 118/76 111/68 114/76 120/75  Pulse: 69 73 77 68  Resp: 14 17 18 18   Temp: 97.7 F (36.5 C) 98.3 F (36.8 C) (!) 97.2 F (36.2 C) (!)  97.4 F (36.3 C)  TempSrc: Oral Oral Oral Oral  SpO2: 99% 98% 95% 100%  Weight:      Height:        Intake/Output Summary (Last 24 hours) at 04/18/2019 1155 Last data filed at 04/17/2019 1730 Gross per 24 hour  Intake 714 ml  Output --  Net 714 ml   Filed Weights   04/15/19 1517 04/15/19 2119  Weight: 42.6 kg 41.9 kg    Examination:  General exam: NAD. Respiratory system: CTAB.  No wheezes, no crackles, no rhonchi.  Normal respiratory effort.  Cardiovascular system:RRR no murmurs rubs or gallops.  No JVD.  No lower extremity edema. Gastrointestinal system: Abdomen is nontender, nondistended, soft, positive bowel sounds.  No rebound.  No guarding.  Central nervous system: Alert and oriented. No focal neurological deficits. Extremities: Symmetric 5 x 5 power. Skin: No rashes, lesions or ulcers Psychiatry: Judgement and insight appear normal. Mood & affect appropriate.     Data Reviewed: I have personally reviewed following labs and imaging studies  CBC: Recent Labs  Lab 04/15/19 1537 04/16/19 0305 04/17/19 0222 04/18/19 0602  WBC 7.0 5.4 5.5 4.9  NEUTROABS  --   --  4.5 3.9  HGB 8.5* 7.7* 7.8* 8.0*  HCT 27.8* 24.9* 24.8* 25.7*  MCV 96.9 95.4 93.2 95.2  PLT 255 224 233 568   Basic Metabolic Panel: Recent Labs  Lab 04/15/19 1537 04/15/19 1722 04/16/19 0305 04/17/19 0222 04/18/19 0602  NA 145  --  143 137 138  K 3.4*  --  2.9* 4.4 4.5  CL 124*  --  121* 114* 113*  CO2 13*  --  13* 17* 19*  GLUCOSE 86  --  71 80 78  BUN 8  --  8 11 7*  CREATININE 1.22*  --  1.14* 0.94 0.81  CALCIUM 5.0*  --  5.0* 6.2* 6.8*  MG  --  0.5* 0.5* 1.8 1.7  PHOS  --   --   --  <1.0* 1.6*   GFR: Estimated Creatinine Clearance: 48.2 mL/min (by C-G formula based on SCr of 0.81 mg/dL). Liver Function Tests: Recent Labs  Lab 04/15/19 1537 04/16/19 0305 04/17/19 0222 04/18/19 0602  AST 13* 12*  --   --   ALT 14 12  --   --   ALKPHOS 63 54  --   --   BILITOT 0.3 0.2*  --   --    PROT 4.6* 3.9*  --   --   ALBUMIN 2.8* 2.3* 2.3* 2.4*   Recent Labs  Lab 04/15/19 1537  LIPASE 150*   No results for input(s): AMMONIA in the last 168 hours. Coagulation Profile: No results for input(s): INR, PROTIME in the last 168 hours. Cardiac Enzymes: No results for input(s): CKTOTAL, CKMB, CKMBINDEX, TROPONINI in the last 168 hours. BNP (last 3 results) No results for input(s): PROBNP in the last 8760 hours. HbA1C: No results for input(s): HGBA1C in the last 72 hours. CBG: Recent Labs  Lab 04/16/19 0515 04/16/19 1275  GLUCAP 62* 82   Lipid Profile: No results for input(s): CHOL, HDL, LDLCALC, TRIG, CHOLHDL, LDLDIRECT in the last 72 hours. Thyroid Function Tests: No results for input(s): TSH, T4TOTAL, FREET4, T3FREE, THYROIDAB in the last 72 hours. Anemia Panel: Recent Labs    04/16/19 1049  VITAMINB12 178*  FOLATE 18.5  FERRITIN 47  TIBC 158*  IRON 18*   Sepsis Labs: No results for input(s): PROCALCITON, LATICACIDVEN in the last 168 hours.  Recent Results (from the past 240 hour(s))  SARS CORONAVIRUS 2 (TAT 6-24 HRS) Nasopharyngeal Nasopharyngeal Swab     Status: None   Collection Time: 04/15/19  6:01 PM   Specimen: Nasopharyngeal Swab  Result Value Ref Range Status   SARS Coronavirus 2 NEGATIVE NEGATIVE Final    Comment: (NOTE) SARS-CoV-2 target nucleic acids are NOT DETECTED. The SARS-CoV-2 RNA is generally detectable in upper and lower respiratory specimens during the acute phase of infection. Negative results do not preclude SARS-CoV-2 infection, do not rule out co-infections with other pathogens, and should not be used as the sole basis for treatment or other patient management decisions. Negative results must be combined with clinical observations, patient history, and epidemiological information. The expected result is Negative. Fact Sheet for Patients: SugarRoll.be Fact Sheet for Healthcare  Providers: https://www.woods-mathews.com/ This test is not yet approved or cleared by the [place] FDA and  has been authorized for detection and/or diagnosis of SARS-CoV-2 by FDA under an Emergency Use Authorization (EUA). This EUA will remain  in effect (meaning this test can be used) for the duration of the COVID-19 declaration under Section 56 4(b)(1) of the Act, 21 U.S.C. section 360bbb-3(b)(1), unless the authorization is terminated or revoked sooner. Performed at [facility], [place] [address], [place],  [address]          Radiology Studies: No results found.      Scheduled Meds:  atenolol  25 mg Oral Daily   calcitRIOL  0.25 mcg Oral Daily   calcium carbonate  2 tablet Oral TID   enoxaparin (LOVENOX) injection  30 mg Subcutaneous Q24H   febuxostat  40 mg Oral Daily   hydrocortisone  25 mg Rectal BID   levothyroxine  137 mcg Oral QAC breakfast   multivitamin with minerals   Oral Q breakfast   mycophenolate  1,000 mg Oral Q24H   mycophenolate  500 mg Oral Q24H   omega-3 acid ethyl esters  1 g Oral Q breakfast   potassium chloride SA  40 mEq Oral BID   predniSONE  5 mg Oral Q breakfast   rifaximin  550 mg Oral BID   sodium bicarbonate  1,300 mg Oral TID   tacrolimus  1 mg Oral BID   cyanocobalamin  2,000 mcg Oral Daily   Vitamin D (Ergocalciferol)  50,000 Units Oral Q Sun   Continuous Infusions:  magnesium sulfate bolus IVPB 4 g (04/18/19 1044)   sodium phosphate  Dextrose 5% IVPB 30 mmol (04/18/19 1019)     LOS: 2 days    Time spent: 35 minutes    Irine Seal, MD [facility]   To contact the attending provider between 7A-7P or the covering provider during after hours 7P-7A, please log into the web site [email or web address] and access using universal [place] password for that web site. If you do not have the password, please call the hospital operator.  04/18/2019, 11:55 AM

## 2019-04-18 NOTE — Progress Notes (Signed)
[facility] Progress Note    Assessment/ Plan:    1.  Kidney transplant: Follows with Dr. Joelyn Oms.  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  slight uptrend in Cr 1.3, last OP Cr 1.2--> better with IVFs. Cr 0.8 today  She looks more euvolemic today and is off IVF -- I think this is appropriate with less stool volume and better po intake and really as a test for possible d/c tomorrow she says.  Continue IS, check tac trough - ordered 3/22.  Would not expect excessive renal losses of K, Mg, Phos, Ca, bicarb given clinical picture (this would be essentially fanconi's syndrome which I don't have a high suspicion for).        2.  Hypocalcemia: aggressive repletion with IV calcium gluconate, TUMS TID, calcitriol  3.  Hypomagnesemia: aggressive repletion with IV mag-- agree with 4 g dosing, mag gluconate could still cause diarrhea so will hold off on that for now  4.  Hypokalemia:  Replete, dec to home dose 20 daily   5.  Hypophosphatemia: s/p sodium phos 3/21, IV phos today.  6.  Diarrhea: ongoing issue. Extensive workup.  Getting 24 hr urine collection for carcinoid.  Per pt, EGD and c-scope have been neg.  CMV PCR negative in our office 04/08/19.  ? Chronic norovirus.  Would recheck GI pathogen panel if high suspicion (can occur in renal transplants)--. This was positive in care everyhwere 02/16/19 so will check again, pending as of 3/22 afternoon.  7.  Anemia: Hgb 8, %sat 11, s/p dose of feraheme 3/21  8.  Dispo: Pending - she is not ready for d/c yet  Subjective:    Cr down to 0.8.  Electrolytes are better.  Diarrhea continues but says less watery.  Feeling much better.    I/Os 950/300 yest   Objective:   BP 120/75 (BP Location: Left Arm)   Pulse 68   Temp (!) 97.4 F (36.3 C) (Oral) Comment: Pt. just drank ice water  Resp 18   Ht 5\' 2"  (1.575 m)   Wt 41.9 kg   LMP 01/16/2012   SpO2 100%   BMI 16.90 kg/m    Intake/Output Summary (Last 24 hours) at 04/18/2019 1313 Last data filed at 04/17/2019 1730 Gross per 24 hour  Intake 714 ml  Output --  Net 714 ml   Weight change:   Physical Exam: GEN: thin, NAD, sitting in bed, looks fatigued HEENT EOMI PERRL NECK no JVD PULM clear bilaterally CV RRR ABD nontender NABS  EXT no LE edema NEURO AAO x3 nonfocal SKIN warm and dry MSK no effusions  Imaging: No results found.  Labs: BMET Recent Labs  Lab 04/15/19 1537 04/16/19 0305 04/17/19 0222 04/18/19 0602  NA 145 143 137 138  K 3.4* 2.9* 4.4 4.5  CL 124* 121* 114* 113*  CO2 13* 13* 17* 19*  GLUCOSE 86 71 80 78  BUN 8 8 11  7*  CREATININE 1.22* 1.14* 0.94 0.81  CALCIUM 5.0* 5.0* 6.2* 6.8*  PHOS  --   --  <1.0* 1.6*   CBC Recent Labs  Lab 04/15/19 1537 04/16/19 0305 04/17/19 0222 04/18/19 0602  WBC 7.0 5.4 5.5 4.9  NEUTROABS  --   --  4.5 3.9  HGB 8.5* 7.7* 7.8* 8.0*  HCT 27.8* 24.9* 24.8* 25.7*  MCV 96.9 95.4 93.2 95.2  PLT 255 224 233 251    Medications:    .  atenolol  25 mg Oral Daily  . calcitRIOL  0.25 mcg Oral Daily  . calcium carbonate  2 tablet Oral TID  . enoxaparin (LOVENOX) injection  30 mg Subcutaneous Q24H  . febuxostat  40 mg Oral Daily  . hydrocortisone  25 mg Rectal BID  . levothyroxine  137 mcg Oral QAC breakfast  . multivitamin with minerals   Oral Q breakfast  . mycophenolate  1,000 mg Oral Q24H  . mycophenolate  500 mg Oral Q24H  . omega-3 acid ethyl esters  1 g Oral Q breakfast  . potassium chloride SA  40 mEq Oral BID  . predniSONE  5 mg Oral Q breakfast  . rifaximin  550 mg Oral BID  . sodium bicarbonate  1,300 mg Oral TID  . tacrolimus  1 mg Oral BID  . cyanocobalamin  2,000 mcg Oral Daily  . Vitamin D (Ergocalciferol)  50,000 Units Oral Q Bartolo Darter MD [facility] Pager [phone]

## 2019-04-19 ENCOUNTER — Encounter (HOSPITAL_COMMUNITY): Payer: Self-pay | Admitting: Internal Medicine

## 2019-04-19 DIAGNOSIS — N189 Chronic kidney disease, unspecified: Secondary | ICD-10-CM | POA: Diagnosis not present

## 2019-04-19 DIAGNOSIS — E876 Hypokalemia: Secondary | ICD-10-CM

## 2019-04-19 DIAGNOSIS — Z87891 Personal history of nicotine dependence: Secondary | ICD-10-CM

## 2019-04-19 DIAGNOSIS — E872 Acidosis: Secondary | ICD-10-CM

## 2019-04-19 DIAGNOSIS — A0811 Acute gastroenteropathy due to Norwalk agent: Secondary | ICD-10-CM | POA: Diagnosis present

## 2019-04-19 DIAGNOSIS — K529 Noninfective gastroenteritis and colitis, unspecified: Secondary | ICD-10-CM | POA: Diagnosis not present

## 2019-04-19 DIAGNOSIS — Z885 Allergy status to narcotic agent status: Secondary | ICD-10-CM

## 2019-04-19 DIAGNOSIS — Z940 Kidney transplant status: Secondary | ICD-10-CM

## 2019-04-19 LAB — RENAL FUNCTION PANEL
Albumin: 2.4 g/dL — ABNORMAL LOW (ref 3.5–5.0)
Anion gap: 3 — ABNORMAL LOW (ref 5–15)
BUN: 7 mg/dL — ABNORMAL LOW (ref 8–23)
CO2: 21 mmol/L — ABNORMAL LOW (ref 22–32)
Calcium: 7.1 mg/dL — ABNORMAL LOW (ref 8.9–10.3)
Chloride: 110 mmol/L (ref 98–111)
Creatinine, Ser: 0.81 mg/dL (ref 0.44–1.00)
GFR calc Af Amer: >60 mL/min (ref >60)
GFR calc non Af Amer: >60 mL/min (ref >60)
Glucose, Bld: 83 mg/dL (ref 70–99)
Phosphorus: 2 mg/dL — ABNORMAL LOW (ref 2.5–4.6)
Potassium: 5.6 mmol/L — ABNORMAL HIGH (ref 3.5–5.1)
Sodium: 134 mmol/L — ABNORMAL LOW (ref 135–145)

## 2019-04-19 LAB — POTASSIUM: Potassium: 4.6 mmol/L (ref 3.5–5.1)

## 2019-04-19 LAB — GLUCOSE, CAPILLARY: Glucose-Capillary: 80 mg/dL (ref 70–99)

## 2019-04-19 LAB — MAGNESIUM: Magnesium: 2.2 mg/dL (ref 1.7–2.4)

## 2019-04-19 MED ORDER — SODIUM PHOSPHATES 45 MMOLE/15ML IV SOLN
30.0000 mmol | Freq: Once | INTRAVENOUS | Status: AC
  Administered 2019-04-19: 30 mmol via INTRAVENOUS
  Filled 2019-04-19: qty 10

## 2019-04-19 NOTE — Progress Notes (Signed)
PROGRESS NOTE    Vanessa [name]  GEZ:[id number] DOB: 1960/03/14 DOA: 04/15/2019 PCP: [name], MD    Brief Narrative:  HPI per Dr. Olen Cordial Badon is a 59 y.o. female with medical history significant of Kidney transplant many years ago, BRCA in remission, HTN.  Pt has been having diarrhea since Nov.  Pt being worked up by [facility] for this.  Had positive GI pathogen pnl for norovirus in Jan, remainder of work up neg thus far but ongoing.  Pt sent in to ED today due to abnormal labs with nephrology today: low calcium and low bicarb.   ED Course: Calcium 5.0, Mg 0.5, bicarb 13, Creat 1.2 (baseline).  2g calcium, 2g magnesium in ED, hospitalist asked to admit.  Nephro consulted  Assessment & Plan:   Principal Problem:   Hypomagnesemia Active Problems:   History of left breast cancer   Anemia in chronic renal disease   H/O kidney transplant   Hypertension   Chronic diarrhea   Hypocalcemia   Metabolic acidosis, NAG, bicarbonate losses   Nucleic acid assay by PCR positive for norovirus  1.hypomagnesemia/hypocalcemia/hypokalemia/hypophosphatemia/non-anion gap metabolic acidosis Likely secondary to GI loss GI losses from ongoing diarrhea.  Magnesium noted to be 0.5 on 04/16/2019 and currently at 2.2 today.  Phosphorus level 2.  Magnesium level at 2.2.  Sodium phosphate 30 mmol IV x1.  Changed IV fluids to bicarb drip however this was discontinued and patient placed on LR per nephrology.  IV fluids have been saline lock.  Patient started on oral bicarb tablets per nephrology.. Status post IV calcium gluconate. Continue oral calcium carbonate 3 times daily, calcitriol.  Nephrology following and appreciate input and recommendations.  2.  Chronic diarrhea Being worked up in the outpatient setting by GI.  Some improvement with consistency of stools.  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  5 HIAA  currently pending.  GI pathogen panel and stool positive for norovirus.  Due to patient's immunocompromise status will consult with ID for further evaluation and recommendations.  Imodium as needed.  3.  Status post kidney transplant Improved.  Creatinine currently at 0.81.  IV fluids have been discontinued and renal function currently stable.  Continue, CellCept, prednisone, Prograf.  Per nephrology.   4.  Hypothyroidism Synthroid.    5.  Anemia of chronic disease/vitamin B12 deficiency Anemia panel consistent with anemia of chronic disease with iron level of 18, TIBC of 158, folate of 18.5, vitamin B12 levels of 178.  Patient has been started on oral vitamin B12 supplementation.  Status post IV Feraheme.  Hemoglobin stable at 8.0.  Transfusion threshold hemoglobin < 7.    6.  Hypertension Continue atenolol.  7.  History of BRCA In remission.  Outpatient follow-up.  8.  Hyperkalemia Repeat potassium at 4.6.   DVT prophylaxis: Lovenox Code Status: Full Family Communication: Updated patient.  No family at bedside. Disposition Plan:  . Patient came from: Home            . Anticipated d/c place: Home . Barriers to d/c OR conditions which need to be met to effect a safe d/c: Home when clinically improved, electrolytes corrected, work-up completed and when cleared by nephrology and assessed by ID.   Consultants:   Nephrology: Dr. Hollie Salk 04/16/2019  ID pending  Procedures:  None  Antimicrobials:   None   Subjective: Patient sitting up in her side of the bed.  Stated had a bowel movement  with consistency in between a soft stool and watery.  Feeling better today than she did yesterday.  Tolerating oral intake.  Denies any chest pain or shortness of breath.    Objective: Vitals:   04/18/19 1442 04/18/19 2156 04/18/19 2300 04/19/19 0601  BP: 128/81 130/84  138/84  Pulse: 78 77  70  Resp: '17 18  17  '$ Temp: 97.8 F (36.6 C) (!) 97.5 F (36.4 C)  97.9 F (36.6 C)  TempSrc:  Oral Oral  Oral  SpO2: 93% 100% 100% 99%  Weight:      Height:        Intake/Output Summary (Last 24 hours) at 04/19/2019 1300 Last data filed at 04/19/2019 0900 Gross per 24 hour  Intake 720 ml  Output --  Net 720 ml   Filed Weights   04/15/19 1517 04/15/19 2119  Weight: 42.6 kg 41.9 kg    Examination:  General exam: NAD. Respiratory system: Lungs clear to auscultation bilaterally.  No wheezes, no crackles, no rhonchi. Cardiovascular system: Regular rate rhythm no murmurs rubs or gallops.  No JVD.  No lower extremity edema. Gastrointestinal system: Abdomen is soft, nontender, nondistended, positive bowel sounds.  No rebound.  No guarding.  Central nervous system: Alert and oriented. No focal neurological deficits. Extremities: Symmetric 5 x 5 power. Skin: No rashes, lesions or ulcers Psychiatry: Judgement and insight appear normal. Mood & affect appropriate.     Data Reviewed: I have personally reviewed following labs and imaging studies  CBC: Recent Labs  Lab 04/15/19 1537 04/16/19 0305 04/17/19 0222 04/18/19 0602  WBC 7.0 5.4 5.5 4.9  NEUTROABS  --   --  4.5 3.9  HGB 8.5* 7.7* 7.8* 8.0*  HCT 27.8* 24.9* 24.8* 25.7*  MCV 96.9 95.4 93.2 95.2  PLT 255 224 233 505   Basic Metabolic Panel: Recent Labs  Lab 04/15/19 1537 04/15/19 1537 04/15/19 1722 04/16/19 0305 04/17/19 0222 04/18/19 0602 04/19/19 0126 04/19/19 0932  NA 145  --   --  143 137 138 134*  --   K 3.4*   < >  --  2.9* 4.4 4.5 5.6* 4.6  CL 124*  --   --  121* 114* 113* 110  --   CO2 13*  --   --  13* 17* 19* 21*  --   GLUCOSE 86  --   --  71 80 78 83  --   BUN 8  --   --  8 11 7* 7*  --   CREATININE 1.22*  --   --  1.14* 0.94 0.81 0.81  --   CALCIUM 5.0*  --   --  5.0* 6.2* 6.8* 7.1*  --   MG  --   --  0.5* 0.5* 1.8 1.7 2.2  --   PHOS  --   --   --   --  <1.0* 1.6* 2.0*  --    < > = values in this interval not displayed.   GFR: Estimated Creatinine Clearance: 48.2 mL/min (by C-G formula  based on SCr of 0.81 mg/dL). Liver Function Tests: Recent Labs  Lab 04/15/19 1537 04/16/19 0305 04/17/19 0222 04/18/19 0602 04/19/19 0126  AST 13* 12*  --   --   --   ALT 14 12  --   --   --   ALKPHOS 63 54  --   --   --   BILITOT 0.3 0.2*  --   --   --   PROT 4.6* 3.9*  --   --   --  ALBUMIN 2.8* 2.3* 2.3* 2.4* 2.4*   Recent Labs  Lab 04/15/19 1537  LIPASE 150*   No results for input(s): AMMONIA in the last 168 hours. Coagulation Profile: No results for input(s): INR, PROTIME in the last 168 hours. Cardiac Enzymes: No results for input(s): CKTOTAL, CKMB, CKMBINDEX, TROPONINI in the last 168 hours. BNP (last 3 results) No results for input(s): PROBNP in the last 8760 hours. HbA1C: No results for input(s): HGBA1C in the last 72 hours. CBG: Recent Labs  Lab 04/16/19 0515 04/16/19 0625 04/19/19 0805  GLUCAP 62* 82 80   Lipid Profile: No results for input(s): CHOL, HDL, LDLCALC, TRIG, CHOLHDL, LDLDIRECT in the last 72 hours. Thyroid Function Tests: No results for input(s): TSH, T4TOTAL, FREET4, T3FREE, THYROIDAB in the last 72 hours. Anemia Panel: No results for input(s): VITAMINB12, FOLATE, FERRITIN, TIBC, IRON, RETICCTPCT in the last 72 hours. Sepsis Labs: No results for input(s): PROCALCITON, LATICACIDVEN in the last 168 hours.  Recent Results (from the past 240 hour(s))  SARS CORONAVIRUS 2 (TAT 6-24 HRS) Nasopharyngeal Nasopharyngeal Swab     Status: None   Collection Time: 04/15/19  6:01 PM   Specimen: Nasopharyngeal Swab  Result Value Ref Range Status   SARS Coronavirus 2 NEGATIVE NEGATIVE Final    Comment: (NOTE) SARS-CoV-2 target nucleic acids are NOT DETECTED. The SARS-CoV-2 RNA is generally detectable in upper and lower respiratory specimens during the acute phase of infection. Negative results do not preclude SARS-CoV-2 infection, do not rule out co-infections with other pathogens, and should not be used as the sole basis for treatment or other  patient management decisions. Negative results must be combined with clinical observations, patient history, and epidemiological information. The expected result is Negative. Fact Sheet for Patients: SugarRoll.be Fact Sheet for Healthcare Providers: https://www.woods-mathews.com/ This test is not yet approved or cleared by the [place] FDA and  has been authorized for detection and/or diagnosis of SARS-CoV-2 by FDA under an Emergency Use Authorization (EUA). This EUA will remain  in effect (meaning this test can be used) for the duration of the COVID-19 declaration under Section 56 4(b)(1) of the Act, 21 U.S.C. section 360bbb-3(b)(1), unless the authorization is terminated or revoked sooner. Performed at [facility], [place] [address] [place], [place] [address]   GI pathogen panel by PCR, stool     Status: Abnormal   Collection Time: 04/16/19 11:58 AM   Specimen: Stool  Result Value Ref Range Status   Plesiomonas shigelloides NOT DETECTED NOT DETECTED Final   Yersinia enterocolitica NOT DETECTED NOT DETECTED Final   Vibrio NOT DETECTED NOT DETECTED Final   Enteropathogenic E coli NOT DETECTED NOT DETECTED Final   E coli (ETEC) LT/ST NOT DETECTED NOT DETECTED Final   E coli 3545 by PCR Not applicable NOT DETECTED Final   Cryptosporidium by PCR NOT DETECTED NOT DETECTED Final   Entamoeba histolytica NOT DETECTED NOT DETECTED Final   Adenovirus F 40/41 NOT DETECTED NOT DETECTED Final   Norovirus GI/GII DETECTED (A) NOT DETECTED Final   Sapovirus NOT DETECTED NOT DETECTED Final    Comment: (NOTE) Performed At: [facility] [place], [place] [id number] Rush Farmer MD DS:[id number]    Vibrio cholerae NOT DETECTED NOT DETECTED Final   Campylobacter by PCR NOT DETECTED NOT DETECTED Final   Salmonella by PCR NOT DETECTED NOT DETECTED Final   E coli (STEC) NOT DETECTED NOT DETECTED Final   Enteroaggregative E  coli NOT DETECTED NOT DETECTED Final   Shigella  by PCR NOT DETECTED NOT DETECTED Final   Cyclospora cayetanensis NOT DETECTED NOT DETECTED Final   Astrovirus NOT DETECTED NOT DETECTED Final   G lamblia by PCR NOT DETECTED NOT DETECTED Final   Rotavirus A by PCR NOT DETECTED NOT DETECTED Final         Radiology Studies: No results found.      Scheduled Meds: . atenolol  25 mg Oral Daily  . calcitRIOL  0.25 mcg Oral Daily  . calcium carbonate  2 tablet Oral TID  . enoxaparin (LOVENOX) injection  30 mg Subcutaneous Q24H  . febuxostat  40 mg Oral Daily  . hydrocortisone  25 mg Rectal BID  . levothyroxine  137 mcg Oral QAC breakfast  . multivitamin with minerals   Oral Q breakfast  . mycophenolate  1,000 mg Oral Q24H  . mycophenolate  500 mg Oral Q24H  . omega-3 acid ethyl esters  1 g Oral Q breakfast  . predniSONE  5 mg Oral Q breakfast  . rifaximin  550 mg Oral BID  . sodium bicarbonate  1,300 mg Oral TID  . tacrolimus  1 mg Oral BID  . cyanocobalamin  2,000 mcg Oral Daily  . Vitamin D (Ergocalciferol)  50,000 Units Oral Q Sun   Continuous Infusions: . sodium phosphate  Dextrose 5% IVPB 30 mmol (04/19/19 1058)     LOS: 3 days    Time spent: 35 minutes    Irine Seal, MD [facility]   To contact the attending provider between 7A-7P or the covering provider during after hours 7P-7A, please log into the web site [email or web address] and access using universal [place] password for that web site. If you do not have the password, please call the hospital operator.  04/19/2019, 1:00 PM

## 2019-04-19 NOTE — Progress Notes (Signed)
Pharmacy Antibiotic Note  Vanessa [name] is a 59 y.o. female renal transplant pt admitted on 04/15/2019 with diarrhea. Pt has had an extensive workup for her diarrhea (CMV negative).  ID suspects that her chronic diarrhea is due to Norovirus infection. Pharmacy has been consulted for nitazoxanide dosing.  WBC 4.9, afebrile, CrCl 48.2 ml/min (stable)  Per ClinicalTrials.gov website, the NNITS study (Nitazoxanide for Norovirus in Transplant Patients Study), an ongoing placebo-controlled study of the efficacy/safety of nitazoxanide in solid organ and HST transplant pts with symptomatic diarrhea due to Norovirus, is studying a dose of 500 mg po BID with food X 56 doses for this indication.   Plan: Nitazoxanide 500 mg PO BID with food Pharmacy does not have any nitazoxanide in stock tonight; we will order the drug tomorrow to arrive as soon as possible Monitor clinical improvement in diarrhea  Height: 5\' 2"  (157.5 cm) Weight: 92 lb 6 oz (41.9 kg) IBW/kg (Calculated) : 50.1  Temp (24hrs), Avg:98 F (36.7 C), Min:97.8 F (36.6 C), Max:98.3 F (36.8 C)  Recent Labs  Lab 04/15/19 1537 04/16/19 0305 04/17/19 0222 04/18/19 0602 04/19/19 0126  WBC 7.0 5.4 5.5 4.9  --   CREATININE 1.22* 1.14* 0.94 0.81 0.81    Estimated Creatinine Clearance: 48.2 mL/min (by C-G formula based on SCr of 0.81 mg/dL).    Allergies  Allergen Reactions  . Codeine Other (See Comments)    Hallucinations and "[place]"    Antimicrobials this admission: 3/19 rifaximin >>   Microbiology results: 3/20 GI pathogen panel (stool): Yersinia enterocolitis, Adenovirus F 40/41, Rotavirus A PCR: all not detected; Norovirus GI/GII: detected 3/19 COVID: negative 3/20 HIV: negative  Thank you for allowing pharmacy to be a part of this patient's care.  Gillermina Hu, PharmD, BCPS, [facility] Clinical Pharmacist 04/19/2019 9:56 PM

## 2019-04-19 NOTE — Consult Note (Signed)
[facility]    Date of Admission:  04/15/2019          Reason for Consult: Chronic diarrhea probably due to norovirus infection    Referring Provider: Dr. Irine Seal  Assessment: I strongly suspect that her chronic diarrhea is due to norovirus.  There are many case reports of norovirus-induced chronic diarrhea, especially in renal transplant patients.  Although there is no proven treatment for norovirus there are sinus mass, retrospective case studies and series suggesting improvement with nitazoxanide and there is a randomized, placebo controlled trial currently nearing completion.  It may be worthwhile considering a trial of nitazoxanide and I also agree with talking to her transplant team about the possibility of transient reduction in her immunosuppression.  Plan: 1. Consider trial of nitazoxanide.  I will follow up tomorrow.  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 atenolol  25 mg Oral Daily  . calcitRIOL  0.25 mcg Oral Daily  . calcium carbonate  2 tablet Oral TID  . enoxaparin (LOVENOX) injection  30 mg Subcutaneous Q24H  . febuxostat  40 mg Oral Daily  . hydrocortisone  25 mg Rectal BID  . levothyroxine  137 mcg Oral QAC breakfast  . multivitamin with minerals   Oral Q breakfast  . mycophenolate  1,000 mg Oral Q24H  . mycophenolate  500 mg Oral Q24H  . omega-3 acid ethyl esters  1 g Oral Q breakfast  . predniSONE  5 mg Oral Q breakfast  . rifaximin  550 mg Oral BID  . sodium bicarbonate  1,300 mg Oral TID  . tacrolimus  1 mg Oral BID  . cyanocobalamin  2,000 mcg Oral Daily  . Vitamin D (Ergocalciferol)  50,000 Units Oral Q Sun   Continuous Infusions: PRN  Meds:.acetaminophen **OR** acetaminophen, lip balm, liver oil-zinc oxide, loperamide, ondansetron **OR** ondansetron (ZOFRAN) IV  HPI: Vanessa [name] is a 59 y.o. female with a history of renal transplant 17 years ago who developed diarrhea last November.  She has had chronic diarrhea ever since and has undergone an extensive evaluation by her gastroenterologist.  She had a normal colonoscopy.  She had mild gastritis on EGD with no evidence of Helicobacter pylori.  No findings on abdominal CT scans.  Celiac serology was negative.  Norovirus was found on a GI pathogen panel in January.  She has attempted to manage her diarrhea with over-the-counter Imodium.  She usually takes 2 to 3 tablets daily with only partial relief.  She has not noted any clear pattern in her diarrhea relative to her diet.  She was recently started on a trial of rifaximin by her gastroenterologist.  She took 2 doses but was called by her nephrologist and told to come to the hospital for admission on 04/15/2018 because there were significant abnormalities in her blood work.  She had a 9 anion gap metabolic acidosis with hypokalemia hypocalcemia and hypomagnesemia.  C. difficile PCR was negative.  Norovirus was again positive on her GI pathogen panel.   Review of Systems: Review of Systems  Constitutional: Positive for malaise/fatigue and weight loss. Negative for chills, diaphoresis and fever.  Respiratory: Negative for cough, shortness of  breath and wheezing.   Cardiovascular: Negative for chest pain.  Gastrointestinal: Positive for diarrhea, nausea and vomiting. Negative for abdominal pain, blood in stool and constipation.  Genitourinary: Negative for dysuria.  Neurological: Positive for dizziness and headaches.    Past Medical History:  Diagnosis Date  . Anemia in chronic kidney disease(285.21) 03/16/2013  . Brain aneurysm 03/16/2013   Age 65 (1983)  . H/O kidney transplant 03/16/2013  . History of left breast cancer  03/16/2013  . Hx gestational diabetes 03/16/2013  . Hx of primary hypertension 03/16/2013  . Hypertension   . Hypomagnesemia   . Kidney transplant recipient     Social History   Tobacco Use  . Smoking status: Former Smoker    Packs/day: 0.50    Years: 2.00    Pack years: 1.00    Types: Cigarettes  . Smokeless tobacco: Never Used  Substance Use Topics  . Alcohol use: No  . Drug use: No    Family History  Problem Relation Age of Onset  . Brain cancer Mother   . Diabetes type II Father   . Parkinson's disease Father    Allergies  Allergen Reactions  . Codeine Other (See Comments)    Hallucinations and "[place]"    OBJECTIVE: Blood pressure 131/89, pulse 78, temperature 97.8 F (36.6 C), temperature source Oral, resp. rate 18, height 5\' 2"  (1.575 m), weight 41.9 kg, last menstrual period 01/16/2012, SpO2 100 %.  Physical Exam Constitutional:      Comments: She is resting quietly in bed.  She is pleasant and in no distress.  Cardiovascular:     Rate and Rhythm: Normal rate and regular rhythm.     Heart sounds: No murmur.  Pulmonary:     Effort: Pulmonary effort is normal.     Breath sounds: Normal breath sounds.  Abdominal:     General: There is no distension.     Palpations: Abdomen is soft.     Comments: No tenderness over her transplant kidney in the right lower quadrant.  Skin:    Findings: No rash.  Psychiatric:        Mood and Affect: Mood normal.     Lab Results Lab Results  Component Value Date   WBC 4.9 04/18/2019   HGB 8.0 (L) 04/18/2019   HCT 25.7 (L) 04/18/2019   MCV 95.2 04/18/2019   PLT 251 04/18/2019    Lab Results  Component Value Date   CREATININE 0.81 04/19/2019   BUN 7 (L) 04/19/2019   NA 134 (L) 04/19/2019   K 4.6 04/19/2019   CL 110 04/19/2019   CO2 21 (L) 04/19/2019    Lab Results  Component Value Date   ALT 12 04/16/2019   AST 12 (L) 04/16/2019   ALKPHOS 54 04/16/2019   BILITOT 0.2 (L) 04/16/2019      Microbiology: Recent Results (from the past 240 hour(s))  SARS CORONAVIRUS 2 (TAT 6-24 HRS) Nasopharyngeal Nasopharyngeal Swab     Status: None   Collection Time: 04/15/19  6:01 PM   Specimen: Nasopharyngeal Swab  Result Value Ref Range Status   SARS Coronavirus 2 NEGATIVE NEGATIVE Final    Comment: (NOTE) SARS-CoV-2 target nucleic acids are NOT DETECTED. The SARS-CoV-2 RNA is generally detectable in upper and lower respiratory specimens during the acute phase of infection. Negative results do not preclude SARS-CoV-2 infection, do not rule out co-infections with other pathogens, and should not be used as the sole basis for treatment or other patient management decisions.  Negative results must be combined with clinical observations, patient history, and epidemiological information. The expected result is Negative. Fact Sheet for Patients: SugarRoll.be Fact Sheet for Healthcare Providers: https://www.woods-mathews.com/ This test is not yet approved or cleared by the [place] FDA and  has been authorized for detection and/or diagnosis of SARS-CoV-2 by FDA under an Emergency Use Authorization (EUA). This EUA will remain  in effect (meaning this test can be used) for the duration of the COVID-19 declaration under Section 56 4(b)(1) of the Act, 21 U.S.C. section 360bbb-3(b)(1), unless the authorization is terminated or revoked sooner. Performed at [facility], [place] [address] [place], [place] [address]   GI pathogen panel by PCR, stool     Status: Abnormal   Collection Time: 04/16/19 11:58 AM   Specimen: Stool  Result Value Ref Range Status   Plesiomonas shigelloides NOT DETECTED NOT DETECTED Final   Yersinia enterocolitica NOT DETECTED NOT DETECTED Final   Vibrio NOT DETECTED NOT DETECTED Final   Enteropathogenic E coli NOT DETECTED NOT DETECTED Final   E coli (ETEC) LT/ST NOT DETECTED NOT DETECTED Final   E coli [id number] by PCR  Not applicable NOT DETECTED Final   Cryptosporidium by PCR NOT DETECTED NOT DETECTED Final   Entamoeba histolytica NOT DETECTED NOT DETECTED Final   Adenovirus F 40/41 NOT DETECTED NOT DETECTED Final   Norovirus GI/GII DETECTED (A) NOT DETECTED Final   Sapovirus NOT DETECTED NOT DETECTED Final    Comment: (NOTE) Performed At: [facility] [address] [place], [place] [id number] Rush Farmer MD [id number]    Vibrio cholerae NOT DETECTED NOT DETECTED Final   Campylobacter by PCR NOT DETECTED NOT DETECTED Final   Salmonella by PCR NOT DETECTED NOT DETECTED Final   E coli (STEC) NOT DETECTED NOT DETECTED Final   Enteroaggregative E coli NOT DETECTED NOT DETECTED Final   Shigella by PCR NOT DETECTED NOT DETECTED Final   Cyclospora cayetanensis NOT DETECTED NOT DETECTED Final   Astrovirus NOT DETECTED NOT DETECTED Final   G lamblia by PCR NOT DETECTED NOT DETECTED Final   Rotavirus A by PCR NOT DETECTED NOT DETECTED Final    Michel Bickers, MD [facility] [phone] pager   [phone] cell 04/19/2019, 5:20 PM

## 2019-04-19 NOTE — Progress Notes (Signed)
[facility] Progress Note    Assessment/ Plan:    1.  Kidney transplant: Follows with Dr. Joelyn Oms.  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  slight uptrend in Cr 1.3, last OP Cr 1.2--> better with IVFs. Cr 0.8 today  She looks more euvolemic today and is off IVF -- I think this is appropriate with less stool volume and better po intake and really as a test for possible d/c tomorrow she says.  Continue IS, check tac trough - ordered 3/22.  Would not expect excessive renal losses of K, Mg, Phos, Ca, bicarb given clinical picture (this would be essentially fanconi's syndrome which I don't have a high suspicion for).        2.  Hypocalcemia: aggressive repletion with IV calcium gluconate, TUMS TID, calcitriol  3.  Hypomagnesemia: use IV mag PRN  4.  Hypokalemia:  High today, hold supplement   5.  Hypophosphatemia: s/p sodium phos 3/21, IV phos today.  6.  Diarrhea: ongoing issue. Extensive workup.  Getting 24 hr urine collection for carcinoid.  Per pt, EGD and c-scope have been neg.  CMV PCR negative in our office 04/08/19.  ? Chronic norovirus with norovirus still + on GIPP, specific norovirus 1/2 PCR pending.  I think there is a good chance this is the issue.   Pt very reluctant to decrease MMF, will reach out to transplant re: management.  Would appreciate if ID or GI could comment as well.   7.  Anemia: Hgb 8, %sat 11, s/p dose of feraheme 3/21  8.  Dispo: Pending - she is not ready for d/c yet  Subjective:    Cr stable at 0.8.  Electrolytes are improved.  Diarrhea continues but says less watery.  Feeling better.    UOp not recorded yesterday   Objective:   BP 138/84 (BP Location: Right Arm)   Pulse 70   Temp 97.9 F (36.6 C) (Oral)   Resp 17   Ht 5\' 2"  (1.575 m)   Wt 41.9 kg   LMP 01/16/2012   SpO2 99%   BMI 16.90 kg/m   Intake/Output Summary (Last 24 hours) at 04/19/2019 1353 Last data filed at  04/19/2019 0900 Gross per 24 hour  Intake 720 ml  Output --  Net 720 ml   Weight change:   Physical Exam: GEN: thin, NAD, sitting in bed, looks fatigued HEENT EOMI PERRL NECK no JVD PULM normal WOB ABD nontender NABS  EXT no LE edema NEURO AAO x3 nonfocal SKIN warm and dry  Imaging: No results found.  Labs: BMET Recent Labs  Lab 04/15/19 1537 04/16/19 0305 04/17/19 0222 04/18/19 0602 04/19/19 0126 04/19/19 0932  NA 145 143 137 138 134*  --   K 3.4* 2.9* 4.4 4.5 5.6* 4.6  CL 124* 121* 114* 113* 110  --   CO2 13* 13* 17* 19* 21*  --   GLUCOSE 86 71 80 78 83  --   BUN 8 8 11  7* 7*  --   CREATININE 1.22* 1.14* 0.94 0.81 0.81  --   CALCIUM 5.0* 5.0* 6.2* 6.8* 7.1*  --   PHOS  --   --  <1.0* 1.6* 2.0*  --    CBC Recent Labs  Lab 04/15/19 1537 04/16/19 0305 04/17/19 0222 04/18/19 0602  WBC 7.0 5.4 5.5 4.9  NEUTROABS  --   --  4.5 3.9  HGB 8.5* 7.7* 7.8*  8.0*  HCT 27.8* 24.9* 24.8* 25.7*  MCV 96.9 95.4 93.2 95.2  PLT 255 224 233 251    Medications:    . atenolol  25 mg Oral Daily  . calcitRIOL  0.25 mcg Oral Daily  . calcium carbonate  2 tablet Oral TID  . enoxaparin (LOVENOX) injection  30 mg Subcutaneous Q24H  . febuxostat  40 mg Oral Daily  . hydrocortisone  25 mg Rectal BID  . levothyroxine  137 mcg Oral QAC breakfast  . multivitamin with minerals   Oral Q breakfast  . mycophenolate  1,000 mg Oral Q24H  . mycophenolate  500 mg Oral Q24H  . omega-3 acid ethyl esters  1 g Oral Q breakfast  . predniSONE  5 mg Oral Q breakfast  . rifaximin  550 mg Oral BID  . sodium bicarbonate  1,300 mg Oral TID  . tacrolimus  1 mg Oral BID  . cyanocobalamin  2,000 mcg Oral Daily  . Vitamin D (Ergocalciferol)  50,000 Units Oral Q Bartolo Darter MD [facility] Pager [phone]

## 2019-04-19 NOTE — Progress Notes (Deleted)
[name] Progress Note    Assessment/ Plan:    1.  Kidney transplant: Follows with Dr. Joelyn Oms.  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  slight uptrend in Cr 1.3, last OP Cr 1.2--> better with IVFs. Cr 0.8 today  She looks more euvolemic today and is off IVF -- I think this is appropriate with less stool volume and better po intake and really as a test for possible d/c tomorrow she says.  Continue IS, check tac trough - ordered 3/22.  Would not expect excessive renal losses of K, Mg, Phos, Ca, bicarb given clinical picture (this would be essentially fanconi's syndrome which I don't have a high suspicion for).        2.  Hypocalcemia: aggressive repletion with IV calcium gluconate, TUMS TID, calcitriol  3.  Hypomagnesemia: aggressive repletion with IV mag-- agree with 4 g dosing, mag gluconate could still cause diarrhea so will hold off on that for now  4.  Hypokalemia:  Replete, dec to home dose 20 daily   5.  Hypophosphatemia: s/p sodium phos 3/21, IV phos today.  6.  Diarrhea: ongoing issue. Extensive workup.  Getting 24 hr urine collection for carcinoid.  Per pt, EGD and c-scope have been neg.  CMV PCR negative in our office 04/08/19.  ? Chronic norovirus.  Would recheck GI pathogen panel if high suspicion (can occur in renal transplants)--. This was positive in care everyhwere 02/16/19 so will check again, pending as of 3/22 afternoon.  7.  Anemia: Hgb 8, %sat 11, s/p dose of feraheme 3/21  8.  Dispo: Pending - she is not ready for d/c yet  Subjective:    Cr down to 0.8.  Electrolytes are better.  Diarrhea continues but says less watery.  Feeling much better.    I/Os 950/300 yest   Objective:   BP 138/84 (BP Location: Right Arm)   Pulse 70   Temp 97.9 F (36.6 C) (Oral)   Resp 17   Ht 5\' 2"  (1.575 m)   Wt 41.9 kg   LMP 01/16/2012   SpO2 99%   BMI 16.90 kg/m   Intake/Output Summary (Last 24 hours)  at 04/19/2019 1321 Last data filed at 04/19/2019 0900 Gross per 24 hour  Intake 720 ml  Output --  Net 720 ml   Weight change:   Physical Exam: GEN: thin, NAD, sitting in bed, looks fatigued HEENT EOMI PERRL NECK no JVD PULM clear bilaterally CV RRR ABD nontender NABS  EXT no LE edema NEURO AAO x3 nonfocal SKIN warm and dry MSK no effusions  Imaging: No results found.  Labs: BMET Recent Labs  Lab 04/15/19 1537 04/16/19 0305 04/17/19 0222 04/18/19 0602 04/19/19 0126 04/19/19 0932  NA 145 143 137 138 134*  --   K 3.4* 2.9* 4.4 4.5 5.6* 4.6  CL 124* 121* 114* 113* 110  --   CO2 13* 13* 17* 19* 21*  --   GLUCOSE 86 71 80 78 83  --   BUN 8 8 11  7* 7*  --   CREATININE 1.22* 1.14* 0.94 0.81 0.81  --   CALCIUM 5.0* 5.0* 6.2* 6.8* 7.1*  --   PHOS  --   --  <1.0* 1.6* 2.0*  --    CBC Recent Labs  Lab 04/15/19 1537 04/16/19 0305 04/17/19 0222 04/18/19 0602  WBC 7.0 5.4 5.5 4.9  NEUTROABS  --   --  4.5 3.9  HGB 8.5* 7.7* 7.8* 8.0*  HCT 27.8* 24.9* 24.8* 25.7*  MCV 96.9 95.4 93.2 95.2  PLT 255 224 233 251    Medications:    . atenolol  25 mg Oral Daily  . calcitRIOL  0.25 mcg Oral Daily  . calcium carbonate  2 tablet Oral TID  . enoxaparin (LOVENOX) injection  30 mg Subcutaneous Q24H  . febuxostat  40 mg Oral Daily  . hydrocortisone  25 mg Rectal BID  . levothyroxine  137 mcg Oral QAC breakfast  . multivitamin with minerals   Oral Q breakfast  . mycophenolate  1,000 mg Oral Q24H  . mycophenolate  500 mg Oral Q24H  . omega-3 acid ethyl esters  1 g Oral Q breakfast  . predniSONE  5 mg Oral Q breakfast  . rifaximin  550 mg Oral BID  . sodium bicarbonate  1,300 mg Oral TID  . tacrolimus  1 mg Oral BID  . cyanocobalamin  2,000 mcg Oral Daily  . Vitamin D (Ergocalciferol)  50,000 Units Oral Q Bartolo Darter MD [facility] Pager [phone]

## 2019-04-20 DIAGNOSIS — E876 Hypokalemia: Secondary | ICD-10-CM | POA: Diagnosis not present

## 2019-04-20 DIAGNOSIS — Z885 Allergy status to narcotic agent status: Secondary | ICD-10-CM | POA: Diagnosis not present

## 2019-04-20 DIAGNOSIS — Z940 Kidney transplant status: Secondary | ICD-10-CM | POA: Diagnosis not present

## 2019-04-20 DIAGNOSIS — K529 Noninfective gastroenteritis and colitis, unspecified: Secondary | ICD-10-CM | POA: Diagnosis not present

## 2019-04-20 LAB — RENAL FUNCTION PANEL
Albumin: 2.2 g/dL — ABNORMAL LOW (ref 3.5–5.0)
Anion gap: 7 (ref 5–15)
BUN: 9 mg/dL (ref 8–23)
CO2: 20 mmol/L — ABNORMAL LOW (ref 22–32)
Calcium: 7.5 mg/dL — ABNORMAL LOW (ref 8.9–10.3)
Chloride: 108 mmol/L (ref 98–111)
Creatinine, Ser: 0.86 mg/dL (ref 0.44–1.00)
GFR calc Af Amer: >60 mL/min (ref >60)
GFR calc non Af Amer: >60 mL/min (ref >60)
Glucose, Bld: 74 mg/dL (ref 70–99)
Phosphorus: 2.9 mg/dL (ref 2.5–4.6)
Potassium: 4.2 mmol/L (ref 3.5–5.1)
Sodium: 135 mmol/L (ref 135–145)

## 2019-04-20 LAB — CBC
HCT: 24.9 % — ABNORMAL LOW (ref 36.0–46.0)
Hemoglobin: 7.7 g/dL — ABNORMAL LOW (ref 12.0–15.0)
MCH: 29.3 pg (ref 26.0–34.0)
MCHC: 30.9 g/dL (ref 30.0–36.0)
MCV: 94.7 fL (ref 80.0–100.0)
Platelets: 234 10*3/uL (ref 150–400)
RBC: 2.63 MIL/uL — ABNORMAL LOW (ref 3.87–5.11)
RDW: 18.2 % — ABNORMAL HIGH (ref 11.5–15.5)
WBC: 4.9 10*3/uL (ref 4.0–10.5)
nRBC: 0 % (ref 0.0–0.2)

## 2019-04-20 LAB — GLUCOSE, CAPILLARY: Glucose-Capillary: 71 mg/dL (ref 70–99)

## 2019-04-20 LAB — MAGNESIUM: Magnesium: 1.5 mg/dL — ABNORMAL LOW (ref 1.7–2.4)

## 2019-04-20 MED ORDER — NITAZOXANIDE 500 MG PO TABS: 500.0000 mg | ORAL_TABLET | Freq: Two times a day (BID) | ORAL | 0 refills | Status: DC

## 2019-04-20 MED ORDER — MYCOPHENOLATE MOFETIL 250 MG PO CAPS
500.0000 mg | ORAL_CAPSULE | ORAL | Status: DC
  Administered 2019-04-21: 500 mg via ORAL
  Filled 2019-04-20: qty 2

## 2019-04-20 MED ORDER — BACITRACIN ZINC 500 UNIT/GM EX OINT: TOPICAL_OINTMENT | CUTANEOUS | Status: DC | PRN

## 2019-04-20 MED ORDER — NITAZOXANIDE 500 MG PO TABS: 500.0000 mg | ORAL_TABLET | Freq: Two times a day (BID) | ORAL | 0 refills | Status: AC

## 2019-04-20 MED FILL — NITAZOXANIDE 500 MG TABS: 500 | 28 days supply | Qty: 56 | Fill #0

## 2019-04-20 NOTE — Progress Notes (Signed)
PROGRESS NOTE    Vanessa [name]  BHA:[id number] DOB: [birth year] DOA: 04/15/2019 PCP: [name], MD   Brief Narrative: 57 YOF who gave kidney to her twin then developed ESRD and got kidney from her mom 50 years ago, BRCA in remission, hypertension with ongoing diarrhea since November, work-up at [facility], had positive GI pathogen panel for norovirus in January and remainder of the work-up thus far has been negative, presented to the ED with abnormal labs for nephrology evaluation.  Patient on GI pathogen panel positive for norovirus and ID was consulted  Subjective:  Has 4 loose BM after lunch yesterday Labs with potassium 4.2, bicarb 2 0, BUN 9, creatinine 0.8, magnesium 1.5, 11 very low at 2.2 hemoglobin low 7.7 g from 7.8-8.0  Assessment & Plan:  Chronic diarrhea, patient has an extensive evaluation patient and again here GI panel showed norovirus positive-suspecting due to norovirus infection in the setting of renal transplant/, chronic immunosuppression. ID consulted for further management-planning nitazoxanide 500 mg twice daily with food when it is available later this week and nephrology planning to cut down on MMF.  Hypomagnesemia/Hypocalcemia/Metabolic acidosis, NAG, bicarbonate losses: 2/2 chronic diarrhea: Bicarb improving potassium is stable, mag 1.5.  History of left breast cancer in remission.  Anemia in chronic renal disease:iron level of 18, TIBC of 158, folate of 18.5, vitamin B12 levels of 178.  Patient has been started on oral vitamin B12 supplementation.  Status post IV Feraheme  Renal transplant x17 years ago, nephrology on board, renal function baseline is stable.  On chronic immunosuppression with tacrolimus, CellCept, prednisone. Recent Labs  Lab 04/16/19 0305 04/17/19 0222 04/18/19 0602 04/19/19 0126 04/20/19 0319  BUN 8 11 7* 7* 9  CREATININE 1.14* 0.94 0.81 0.81 0.86   Hypothyroidism on Synthroid  Hypertension: Controlled on  atenolol.  Nutrition: Diet Order            Diet regular Room service appropriate? Yes; Fluid consistency: Thin  Diet effective now              Body mass index is 16.9 kg/m.  Pressure Ulcer:    DVT prophylaxis:lovenox Code Status: full  Family Communication: plan of care discussed with patient at bedside. Disposition Plan: Patient is from:home Anticipated Disposition: to home Barriers to discharge or conditions that needs to be met prior to discharge: Patient admitted with chronic ongoing diarrhea in the setting of immunosuppression renal transplant and work-up again positive for norovirus and ID consulted-starting on nitazoxanide team when available later this week, remains hospitalized until cleared by nephrology/ID.   Consultants: Nephrology, infectious disease see note  Procedures:see note Microbiology:see note   Medications: Scheduled Meds: . atenolol  25 mg Oral Daily  . calcitRIOL  0.25 mcg Oral Daily  . calcium carbonate  2 tablet Oral TID  . enoxaparin (LOVENOX) injection  30 mg Subcutaneous Q24H  . febuxostat  40 mg Oral Daily  . hydrocortisone  25 mg Rectal BID  . levothyroxine  137 mcg Oral QAC breakfast  . multivitamin with minerals   Oral Q breakfast  . mycophenolate  1,000 mg Oral Q24H  . mycophenolate  500 mg Oral Q24H  . omega-3 acid ethyl esters  1 g Oral Q breakfast  . predniSONE  5 mg Oral Q breakfast  . rifaximin  550 mg Oral BID  . sodium bicarbonate  1,300 mg Oral TID  . tacrolimus  1 mg Oral BID  . cyanocobalamin  2,000 mcg Oral Daily  . Vitamin D (  Ergocalciferol)  50,000 Units Oral Q Sun   Continuous Infusions:  Antimicrobials: Anti-infectives (From admission, onward)   Start     Dose/Rate Route Frequency Ordered Stop   04/20/19 0000  nitazoxanide (ALINIA) 500 MG tablet  Status:  Discontinued     500 mg Oral 2 times daily with meals 04/20/19 0925 04/20/19    04/20/19 0000  nitazoxanide (ALINIA) 500 MG tablet     500 mg Oral 2 times  daily with meals 04/20/19 1156 05/18/19 2359   04/15/19 2200  rifaximin (XIFAXAN) tablet 550 mg     550 mg Oral 2 times daily 04/15/19 2021         Objective: Vitals: Today's Vitals   04/20/19 0500 04/20/19 1025 04/20/19 1100 04/20/19 1351  BP: 132/87 (!) 122/94  121/75  Pulse: 80 84  84  Resp: _0 Temp: 98.5 F (36.9 C) 98.3 F (36.8 C)    TempSrc: Oral Oral    SpO2: 99% (!) 70% 100% 93%  Weight:      Height:      PainSc:   0-No pain     Intake/Output Summary (Last 24 hours) at 04/20/2019 1353 Last data filed at 04/20/2019 0517 Gross per 24 hour  Intake 100 ml  Output --  Net 100 ml   Filed Weights   04/15/19 1517 04/15/19 2119  Weight: 42.6 kg 41.9 kg   Weight change:    Intake/Output from previous day: 03/23 0701 - 03/24 0700 In: 820 [P.O.:820] Out: -  Intake/Output this shift: No intake/output data recorded.  Examination:  General exam: AAOx3,NAD,Weak appearing. HEENT:Oral mucosa moist, Ear/Nose WNL grossly,dentition normal. Respiratory system: bilaterally clear,no wheezing or crackles,no use of accessory muscle, non tender. Cardiovascular system: S1 & S2 +, regular, No JVD. Gastrointestinal system: Abdomen soft, NT,ND, BS+. Nervous System:Alert, awake, moving extremities and grossly nonfocal Extremities: No edema, distal peripheral pulses palpable.  Skin: No rashes,no icterus. MSK: Normal muscle bulk,tone, power  Data Reviewed: I have personally reviewed following labs and imaging studies CBC: Recent Labs  Lab 04/15/19 1537 04/16/19 0305 04/17/19 0222 04/18/19 0602 04/20/19 0319  WBC 7.0 5.4 5.5 4.9 4.9  NEUTROABS  --   --  4.5 3.9  --   HGB 8.5* 7.7* 7.8* 8.0* 7.7*  HCT 27.8* 24.9* 24.8* 25.7* 24.9*  MCV 96.9 95.4 93.2 95.2 94.7  PLT 255 224 233 251 664   Basic Metabolic Panel: Recent Labs  Lab 04/16/19 0305 04/16/19 0305 04/17/19 0222 04/18/19 0602 04/19/19 0126 04/19/19 0932 04/20/19 0319  NA 143  --  137 138 134*  --  135   K 2.9*   < > 4.4 4.5 5.6* 4.6 4.2  CL 121*  --  114* 113* 110  --  108  CO2 13*  --  17* 19* 21*  --  20*  GLUCOSE 71  --  80 78 83  --  74  BUN 8  --  11 7* 7*  --  9  CREATININE 1.14*  --  0.94 0.81 0.81  --  0.86  CALCIUM 5.0*  --  6.2* 6.8* 7.1*  --  7.5*  MG 0.5*  --  1.8 1.7 2.2  --  1.5*  PHOS  --   --  <1.0* 1.6* 2.0*  --  2.9   < > = values in this interval not displayed.   GFR: Estimated Creatinine Clearance: 45.4 mL/min (by C-G formula based on SCr of 0.86 mg/dL). Liver Function Tests: Recent Labs  Lab 04/15/19 1537 04/15/19 1537 04/16/19 0305 04/17/19 0222 04/18/19 0602 04/19/19 0126 04/20/19 0319  AST 13*  --  12*  --   --   --   --   ALT 14  --  12  --   --   --   --   ALKPHOS 63  --  54  --   --   --   --   BILITOT 0.3  --  0.2*  --   --   --   --   PROT 4.6*  --  3.9*  --   --   --   --   ALBUMIN 2.8*   < > 2.3* 2.3* 2.4* 2.4* 2.2*   < > = values in this interval not displayed.   Recent Labs  Lab 04/15/19 1537  LIPASE 150*   No results for input(s): AMMONIA in the last 168 hours. Coagulation Profile: No results for input(s): INR, PROTIME in the last 168 hours. Cardiac Enzymes: No results for input(s): CKTOTAL, CKMB, CKMBINDEX, TROPONINI in the last 168 hours. BNP (last 3 results) No results for input(s): PROBNP in the last 8760 hours. HbA1C: No results for input(s): HGBA1C in the last 72 hours. CBG: Recent Labs  Lab 04/16/19 0515 04/16/19 0625 04/19/19 0805 04/20/19 0743  GLUCAP 62* 82 80 71   Lipid Profile: No results for input(s): CHOL, HDL, LDLCALC, TRIG, CHOLHDL, LDLDIRECT in the last 72 hours. Thyroid Function Tests: No results for input(s): TSH, T4TOTAL, FREET4, T3FREE, THYROIDAB in the last 72 hours. Anemia Panel: No results for input(s): VITAMINB12, FOLATE, FERRITIN, TIBC, IRON, RETICCTPCT in the last 72 hours. Sepsis Labs: No results for input(s): PROCALCITON, LATICACIDVEN in the last 168 hours.  Recent Results (from the past  240 hour(s))  SARS CORONAVIRUS 2 (TAT 6-24 HRS) Nasopharyngeal Nasopharyngeal Swab     Status: None   Collection Time: 04/15/19  6:01 PM   Specimen: Nasopharyngeal Swab  Result Value Ref Range Status   SARS Coronavirus 2 NEGATIVE NEGATIVE Final    Comment: (NOTE) SARS-CoV-2 target nucleic acids are NOT DETECTED. The SARS-CoV-2 RNA is generally detectable in upper and lower respiratory specimens during the acute phase of infection. Negative results do not preclude SARS-CoV-2 infection, do not rule out co-infections with other pathogens, and should not be used as the sole basis for treatment or other patient management decisions. Negative results must be combined with clinical observations, patient history, and epidemiological information. The expected result is Negative. Fact Sheet for Patients: SugarRoll.be Fact Sheet for Healthcare Providers: https://www.woods-mathews.com/ This test is not yet approved or cleared by the [place] FDA and  has been authorized for detection and/or diagnosis of SARS-CoV-2 by FDA under an Emergency Use Authorization (EUA). This EUA will remain  in effect (meaning this test can be used) for the duration of the COVID-19 declaration under Section 56 4(b)(1) of the Act, 21 U.S.C. section 360bbb-3(b)(1), unless the authorization is terminated or revoked sooner. Performed at [facility], [place] [address], [place], [place] [address]   GI pathogen panel by PCR, stool     Status: Abnormal   Collection Time: 04/16/19 11:58 AM   Specimen: Stool  Result Value Ref Range Status   Plesiomonas shigelloides NOT DETECTED NOT DETECTED Final   Yersinia enterocolitica NOT DETECTED NOT DETECTED Final   Vibrio NOT DETECTED NOT DETECTED Final   Enteropathogenic E coli NOT DETECTED NOT DETECTED Final   E coli (ETEC) LT/ST NOT DETECTED NOT DETECTED Final  E coli 1100 by PCR Not applicable NOT DETECTED Final    Cryptosporidium by PCR NOT DETECTED NOT DETECTED Final   Entamoeba histolytica NOT DETECTED NOT DETECTED Final   Adenovirus F 40/41 NOT DETECTED NOT DETECTED Final   Norovirus GI/GII DETECTED (A) NOT DETECTED Final   Sapovirus NOT DETECTED NOT DETECTED Final    Comment: (NOTE) Performed At: [facility] [place], [place] [id number] Rush Farmer MD DT:[id number]    Vibrio cholerae NOT DETECTED NOT DETECTED Final   Campylobacter by PCR NOT DETECTED NOT DETECTED Final   Salmonella by PCR NOT DETECTED NOT DETECTED Final   E coli (STEC) NOT DETECTED NOT DETECTED Final   Enteroaggregative E coli NOT DETECTED NOT DETECTED Final   Shigella by PCR NOT DETECTED NOT DETECTED Final   Cyclospora cayetanensis NOT DETECTED NOT DETECTED Final   Astrovirus NOT DETECTED NOT DETECTED Final   G lamblia by PCR NOT DETECTED NOT DETECTED Final   Rotavirus A by PCR NOT DETECTED NOT DETECTED Final      Radiology Studies: No results found.   LOS: 4 days   Time spent: More than 50% of that time was spent in counseling and/or coordination of care.  Antonieta Pert, MD [facility]  04/20/2019, 1:53 PM

## 2019-04-20 NOTE — Progress Notes (Signed)
[facility] Progress Note    Assessment/ Plan:    1.  Kidney transplant: Follows with Dr. Joelyn Oms.  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  slight uptrend in Cr 1.3, last OP Cr 1.2--> better with IVFs. Cr 0.86 today  She looks euvolemic today and is off IVF.  Continue IS, check tac trough - pending from 3/23.       2.  Diarrhea: ongoing issue. Extensive workup.  24 hr urine collection for carcinoid normal per outpt provider.  Per pt, EGD and c-scope have been neg.  CMV PCR negative in our office 04/08/19.  ? Chronic norovirus with norovirus still + on GIPP, specific norovirus 1/2 PCR pending.  I think there is a good chance this is the issue and ID agrees --  Considering nitazoxanide.  Pt tells me he's reaching out to transplant center re: MMF dose change so I will await this comment.  I have talked to her at length about risk:benefit of holding/reducing MMF; she's understandably scared for rejection but it's low risk and this diarrhea is intolerable.  3.  Electrolyte abnormalities:  Suspected secondary to chronic diarrhea, have been repleted and are being monitored closely.   3.  Anemia: Hgb 7-8, %sat 11, s/p dose of feraheme 3/21.  8.  Dispo: Pending - she is not ready for d/c yet  Subjective:    Cr stable at 0.86.  Electrolytes are improved.  Diarrhea continues and actually worse in the past 24hrs.    No new symptoms.   Objective:   BP (!) 122/94 (BP Location: Right Arm)   Pulse 84   Temp 98.3 F (36.8 C) (Oral)   Resp 18   Ht 5\' 2"  (1.575 m)   Wt 41.9 kg   LMP 01/16/2012   SpO2 100%   BMI 16.90 kg/m   Intake/Output Summary (Last 24 hours) at 04/20/2019 1129 Last data filed at 04/20/2019 0517 Gross per 24 hour  Intake 400 ml  Output -  Net 400 ml   Weight change:   Physical Exam: GEN: thin, NAD, sitting in bed, looks fatigued but nontoxic HEENT EOMI PERRL NECK no JVD PULM normal WOB ABD nontender  NABS  EXT no LE edema NEURO AAO x3 nonfocal SKIN warm and dry  Imaging: No results found.  Labs: BMET Recent Labs  Lab 04/15/19 1537 04/16/19 0305 04/17/19 0222 04/18/19 0602 04/19/19 0126 04/19/19 0932 04/20/19 0319  NA 145 143 137 138 134*  --  135  K 3.4* 2.9* 4.4 4.5 5.6* 4.6 4.2  CL 124* 121* 114* 113* 110  --  108  CO2 13* 13* 17* 19* 21*  --  20*  GLUCOSE 86 71 80 78 83  --  74  BUN 8 8 11  7* 7*  --  9  CREATININE 1.22* 1.14* 0.94 0.81 0.81  --  0.86  CALCIUM 5.0* 5.0* 6.2* 6.8* 7.1*  --  7.5*  PHOS  --   --  <1.0* 1.6* 2.0*  --  2.9   CBC Recent Labs  Lab 04/16/19 0305 04/17/19 0222 04/18/19 0602 04/20/19 0319  WBC 5.4 5.5 4.9 4.9  NEUTROABS  --  4.5 3.9  --   HGB 7.7* 7.8* 8.0* 7.7*  HCT 24.9* 24.8* 25.7* 24.9*  MCV 95.4 93.2 95.2 94.7  PLT 224 233 251 234    Medications:    . atenolol  25 mg Oral Daily  .  calcitRIOL  0.25 mcg Oral Daily  . calcium carbonate  2 tablet Oral TID  . enoxaparin (LOVENOX) injection  30 mg Subcutaneous Q24H  . febuxostat  40 mg Oral Daily  . hydrocortisone  25 mg Rectal BID  . levothyroxine  137 mcg Oral QAC breakfast  . multivitamin with minerals   Oral Q breakfast  . mycophenolate  1,000 mg Oral Q24H  . mycophenolate  500 mg Oral Q24H  . omega-3 acid ethyl esters  1 g Oral Q breakfast  . predniSONE  5 mg Oral Q breakfast  . rifaximin  550 mg Oral BID  . sodium bicarbonate  1,300 mg Oral TID  . tacrolimus  1 mg Oral BID  . cyanocobalamin  2,000 mcg Oral Daily  . Vitamin D (Ergocalciferol)  50,000 Units Oral Q Bartolo Darter MD [facility] Pager [phone]

## 2019-04-20 NOTE — Plan of Care (Signed)
  Problem: Education: Goal: Knowledge of General Education information will improve Description: Including pain rating scale, medication(s)/side effects and non-pharmacologic comfort measures Outcome: Progressing   Problem: Health Behavior/Discharge Planning: Goal: Ability to manage health-related needs will improve Outcome: Progressing   Problem: Clinical Measurements: Goal: Ability to maintain clinical measurements within normal limits will improve Outcome: Progressing Goal: Cardiovascular complication will be avoided Outcome: Progressing   Problem: Activity: Goal: Risk for activity intolerance will decrease Outcome: Progressing   Problem: Nutrition: Goal: Adequate nutrition will be maintained Outcome: Progressing   Problem: Pain Managment: Goal: General experience of comfort will improve Outcome: Progressing   Problem: Safety: Goal: Ability to remain free from injury will improve Outcome: Progressing   Problem: Skin Integrity: Goal: Risk for impaired skin integrity will decrease Outcome: Progressing   

## 2019-04-20 NOTE — Progress Notes (Signed)
ID Pharmacy Note    Nitazoxanide will be available for Vanessa [name] at the earliest by Friday inpatient. If she is discharged prior to Friday, she can pick up her prescription on Monday at the [facility] for Infectious Disease.    Jimmy Footman, PharmD, BCPS, BCIDP Infectious Diseases Clinical Pharmacist Phone: [phone] 04/20/2019 2:35 PM

## 2019-04-20 NOTE — Progress Notes (Signed)
Patient ID: Vanessa [name], female   DOB: 1960/12/14, 59 y.o.   MRN: [id number]         [facility]  Date of Admission:  04/15/2019     ASSESSMENT: I have discussed given a trial of nitazoxanide to Vanessa [name] with my pharmacy team.  There are no adverse drug interactions with her current medications.  Vanessa [name] is willing to try it to see if her diarrhea improves.  We will take this about 3 days to get the medication through the transitions of care pharmacy.  In my opinion, she does not need to remain hospitalized.  Once we have the medication we can arrange for her to pick it up in our clinic.  PLAN: 1. Start nitazoxanide 500 mg twice daily with food when it is available later this week 2. I will arrange follow-up in our clinic  Principal Problem:   Chronic diarrhea Active Problems:   Infection due to Norovirus species   H/O kidney transplant   History of left breast cancer   Anemia in chronic renal disease   Hypertension   Hypomagnesemia   Hypocalcemia   Metabolic acidosis, NAG, bicarbonate losses   Scheduled Meds: . atenolol  25 mg Oral Daily  . calcitRIOL  0.25 mcg Oral Daily  . calcium carbonate  2 tablet Oral TID  . enoxaparin (LOVENOX) injection  30 mg Subcutaneous Q24H  . febuxostat  40 mg Oral Daily  . hydrocortisone  25 mg Rectal BID  . levothyroxine  137 mcg Oral QAC breakfast  . multivitamin with minerals   Oral Q breakfast  . mycophenolate  1,000 mg Oral Q24H  . mycophenolate  500 mg Oral Q24H  . omega-3 acid ethyl esters  1 g Oral Q breakfast  . predniSONE  5 mg Oral Q breakfast  . rifaximin  550 mg Oral BID  . sodium bicarbonate  1,300 mg Oral TID  . tacrolimus  1 mg Oral BID  . cyanocobalamin  2,000 mcg Oral Daily  . Vitamin D (Ergocalciferol)  50,000 Units Oral Q Sun   Continuous Infusions: PRN Meds:.acetaminophen **OR** acetaminophen, lip balm, liver oil-zinc oxide, loperamide, ondansetron **OR** ondansetron  (ZOFRAN) IV   SUBJECTIVE: She continues to have frequent watery diarrhea.  She has not had any nausea today but is eating very little.  Review of Systems: Review of Systems  Constitutional: Negative for fever and weight loss.  Gastrointestinal: Positive for diarrhea. Negative for abdominal pain, nausea and vomiting.    Allergies  Allergen Reactions  . Codeine Other (See Comments)    Hallucinations and "[place]"    OBJECTIVE: Vitals:   04/19/19 2134 04/20/19 0500 04/20/19 1025 04/20/19 1100  BP: 115/80 132/87 (!) 122/94   Pulse: 72 80 84   Resp: 18 18 18    Temp: 98.3 F (36.8 C) 98.5 F (36.9 C) 98.3 F (36.8 C)   TempSrc: Oral Oral Oral   SpO2: 100% 99% (!) 70% 100%  Weight:      Height:       Body mass index is 16.9 kg/m.  Physical Exam Constitutional:      Comments: She is in better spirits today.  Abdominal:     Palpations: Abdomen is soft.     Tenderness: There is no abdominal tenderness.     Lab Results Lab Results  Component Value Date   WBC 4.9 04/20/2019   HGB 7.7 (L) 04/20/2019   HCT 24.9 (L) 04/20/2019   MCV 94.7 04/20/2019  PLT 234 04/20/2019    Lab Results  Component Value Date   CREATININE 0.86 04/20/2019   BUN 9 04/20/2019   NA 135 04/20/2019   K 4.2 04/20/2019   CL 108 04/20/2019   CO2 20 (L) 04/20/2019    Lab Results  Component Value Date   ALT 12 04/16/2019   AST 12 (L) 04/16/2019   ALKPHOS 54 04/16/2019   BILITOT 0.2 (L) 04/16/2019     Microbiology: Recent Results (from the past 240 hour(s))  SARS CORONAVIRUS 2 (TAT 6-24 HRS) Nasopharyngeal Nasopharyngeal Swab     Status: None   Collection Time: 04/15/19  6:01 PM   Specimen: Nasopharyngeal Swab  Result Value Ref Range Status   SARS Coronavirus 2 NEGATIVE NEGATIVE Final    Comment: (NOTE) SARS-CoV-2 target nucleic acids are NOT DETECTED. The SARS-CoV-2 RNA is generally detectable in upper and lower respiratory specimens during the acute phase of infection.  Negative results do not preclude SARS-CoV-2 infection, do not rule out co-infections with other pathogens, and should not be used as the sole basis for treatment or other patient management decisions. Negative results must be combined with clinical observations, patient history, and epidemiological information. The expected result is Negative. Fact Sheet for Patients: SugarRoll.be Fact Sheet for Healthcare Providers: https://www.woods-mathews.com/ This test is not yet approved or cleared by the [place] FDA and  has been authorized for detection and/or diagnosis of SARS-CoV-2 by FDA under an Emergency Use Authorization (EUA). This EUA will remain  in effect (meaning this test can be used) for the duration of the COVID-19 declaration under Section 56 4(b)(1) of the Act, 21 U.S.C. section 360bbb-3(b)(1), unless the authorization is terminated or revoked sooner. Performed at [facility], [place] [address], [place], [place] [address]   GI pathogen panel by PCR, stool     Status: Abnormal   Collection Time: 04/16/19 11:58 AM   Specimen: Stool  Result Value Ref Range Status   Plesiomonas shigelloides NOT DETECTED NOT DETECTED Final   Yersinia enterocolitica NOT DETECTED NOT DETECTED Final   Vibrio NOT DETECTED NOT DETECTED Final   Enteropathogenic E coli NOT DETECTED NOT DETECTED Final   E coli (ETEC) LT/ST NOT DETECTED NOT DETECTED Final   E coli [id number] by PCR Not applicable NOT DETECTED Final   Cryptosporidium by PCR NOT DETECTED NOT DETECTED Final   Entamoeba histolytica NOT DETECTED NOT DETECTED Final   Adenovirus F 40/41 NOT DETECTED NOT DETECTED Final   Norovirus GI/GII DETECTED (A) NOT DETECTED Final   Sapovirus NOT DETECTED NOT DETECTED Final    Comment: (NOTE) Performed At: [facility] [address] [place], [place] [id number] Rush Farmer MD [id number]    Vibrio cholerae NOT DETECTED NOT DETECTED Final    Campylobacter by PCR NOT DETECTED NOT DETECTED Final   Salmonella by PCR NOT DETECTED NOT DETECTED Final   E coli (STEC) NOT DETECTED NOT DETECTED Final   Enteroaggregative E coli NOT DETECTED NOT DETECTED Final   Shigella by PCR NOT DETECTED NOT DETECTED Final   Cyclospora cayetanensis NOT DETECTED NOT DETECTED Final   Astrovirus NOT DETECTED NOT DETECTED Final   G lamblia by PCR NOT DETECTED NOT DETECTED Final   Rotavirus A by PCR NOT DETECTED NOT DETECTED Final    Michel Bickers, MD [facility] [place] [facility] [phone] pager   [phone] 04/20/2019, 11:27 AM

## 2019-04-21 LAB — COMPREHENSIVE METABOLIC PANEL
ALT: 14 U/L (ref 0–44)
AST: 14 U/L — ABNORMAL LOW (ref 15–41)
Albumin: 2.3 g/dL — ABNORMAL LOW (ref 3.5–5.0)
Alkaline Phosphatase: 45 U/L (ref 38–126)
Anion gap: 7 (ref 5–15)
BUN: 9 mg/dL (ref 8–23)
CO2: 18 mmol/L — ABNORMAL LOW (ref 22–32)
Calcium: 7.7 mg/dL — ABNORMAL LOW (ref 8.9–10.3)
Chloride: 110 mmol/L (ref 98–111)
Creatinine, Ser: 0.73 mg/dL (ref 0.44–1.00)
GFR calc Af Amer: >60 mL/min (ref >60)
GFR calc non Af Amer: >60 mL/min (ref >60)
Glucose, Bld: 70 mg/dL (ref 70–99)
Potassium: 4.4 mmol/L (ref 3.5–5.1)
Sodium: 135 mmol/L (ref 135–145)
Total Bilirubin: 0.2 mg/dL — ABNORMAL LOW (ref 0.3–1.2)
Total Protein: 4 g/dL — ABNORMAL LOW (ref 6.5–8.1)

## 2019-04-21 LAB — CBC
HCT: 24.2 % — ABNORMAL LOW (ref 36.0–46.0)
Hemoglobin: 7.3 g/dL — ABNORMAL LOW (ref 12.0–15.0)
MCH: 29.7 pg (ref 26.0–34.0)
MCHC: 30.2 g/dL (ref 30.0–36.0)
MCV: 98.4 fL (ref 80.0–100.0)
Platelets: 235 10*3/uL (ref 150–400)
RBC: 2.46 MIL/uL — ABNORMAL LOW (ref 3.87–5.11)
RDW: 18.1 % — ABNORMAL HIGH (ref 11.5–15.5)
WBC: 5 10*3/uL (ref 4.0–10.5)
nRBC: 0 % (ref 0.0–0.2)

## 2019-04-21 LAB — TACROLIMUS LEVEL: Tacrolimus (FK506) - LabCorp: 5.9 ng/mL (ref 2.0–20.0)

## 2019-04-21 LAB — GLUCOSE, CAPILLARY: Glucose-Capillary: 70 mg/dL (ref 70–99)

## 2019-04-21 MED ORDER — PREDNISONE 5 MG PO TABS: 5.0000 mg | ORAL_TABLET | Freq: Every day | ORAL | 0 refills | Status: AC

## 2019-04-21 NOTE — Discharge Summary (Signed)
Physician Discharge Summary  Vanessa [name] RUE:[id number] DOB: [birth year] DOA: 04/15/2019  PCP: [name], MD  Admit date: 04/15/2019 Discharge date: 04/21/2019  Admitted From: home Disposition:  home  Recommendations for Outpatient Follow-up:  1. Follow up with PCP in 1-2 weeks 2. Please obtain BMP/CBC in one week 3. Please follow up on the following pending results:  Home Health:no  Equipment/Devices: none  Discharge Condition: Stable Code Status: full Diet recommendation: Heart Healthy  Brief/Interim Summary:   Brief Narrative: 64 YOF who gave kidney to her twin then developed ESRD and got kidney from her mom 17 years ago, BRCA in remission, hypertension with ongoing diarrhea since November, work-up at [facility], had positive GI pathogen panel for norovirus in January and remainder of the work-up thus far has been negative, presented to the ED with abnormal labs for nephrology evaluation.  Patient on GI pathogen panel positive for norovirus and ID was consulted Patient was seen by ID, nephrology, at this point diarrhea more or less chronic and has not needed any electrolyte replacement renal function is stable./No fluid this morning okay to discharge home.  Patient will be started on new medication for norovirus which will be started Friday or Monday and she will pick it up from outpatient pharmacy.  Discharge Diagnoses:  Assessment & Plan:  Chronic diarrhea, patient has an extensive evaluation patient and again here GI panel showed norovirus positive-suspecting due to norovirus infection in the setting of renal transplant/, chronic immunosuppression. ID consulted for further management-planning nitazoxanide 500 mg twice daily with food x 4 weeks.This medication will be available tomorrow or Monday and patient will pick it up from outpatient pharmacy  Hypomagnesemia/Hypocalcemia/Metabolic acidosis, NAG, bicarbonate losses: 2/2 chronic diarrhea: Patient's lites are stable and  improved and has not needed replacement, discussed nephrology will follow up with outpatient lab and okay to discharge home today.  History of left breast cancer in remission.  Anemia in chronic renal disease:iron level of 18, TIBC of 158, folate of 18.5, vitamin B12 levels of 178.  Patient has been started on oral vitamin B12 supplementation.  Status post IV Feraheme  Renal transplant x17 years ago, nephrology on board, renal function baseline is stable.  On chronic immunosuppression with tacrolimus, CellCept, prednisone.  Per [facility] transplant team nephrology going to stop CellCept at least for 2 wks and cont 5 mg of prednisone. Recent Labs  Lab 04/17/19 0222 04/18/19 0602 04/19/19 0126 04/20/19 0319 04/21/19 0232  BUN 11 7* 7* 9 9  CREATININE 0.94 0.81 0.81 0.86 0.73   Hypothyroidism on Synthroid  Hypertension: Controlled on atenolol.  Nutrition: Diet Order            Diet regular Room service appropriate? Yes; Fluid consistency: Thin  Diet effective now              Body mass index is 16.9 kg/m.  Pressure Ulcer:    DVT prophylaxis:lovenox Code Status: full  Family Communication: plan of care discussed with patient at bedside. Disposition Plan: Patient is from:home Anticipated Disposition: to home Barriers to discharge or conditions that needs to be met prior to discharge: Okay for discharge today.  Discussed with nephrology.    Consultants: Nephrology, infectious disease see note  Procedures:see note Microbiology: GI pathogen panel with noro virus   Subjective: Had 4 loose BM 3/24 and felt rough, this am had 1 loose. Hb downtrending No nausea or vomiting today. Tolerating po.  Discharge Exam: Vitals:   04/20/19 2038 04/21/19 0500  BP: Marland Kitchen)  123/92 121/90  Pulse: 86 89  Resp: 18 18  Temp: 98 F (36.7 C) 98 F (36.7 C)  SpO2: 100% 100%   General: Pt is alert, awake, not in acute distress Cardiovascular: RRR, S1/S2 +, no rubs, no gallops Respiratory: CTA  bilaterally, no wheezing, no rhonchi Abdominal: Soft, NT, ND, bowel sounds + Extremities: no edema, no cyanosis  Discharge Instructions  Discharge Instructions    Diet - low sodium heart healthy   Complete by: As directed    Discharge instructions   Complete by: As directed    Follow-up with the nephrology clinic to monitor renal function and electrolytes.  Please call call MD or return to ER for similar or worsening recurring problem that brought you to hospital or if any fever,nausea/vomiting,abdominal pain, uncontrolled pain, chest pain,  shortness of breath or any other alarming symptoms.  Please follow-up your doctor as instructed in a week time and call the office for appointment.  Please avoid alcohol, smoking, or any other illicit substance and maintain healthy habits including taking your regular medications as prescribed.  You were cared for by a hospitalist during your hospital stay. If you have any questions about your discharge medications or the care you received while you were in the hospital after you are discharged, you can call the unit and ask to speak with the hospitalist on call if the hospitalist that took care of you is not available.  Once you are discharged, your primary care physician will handle any further medical issues. Please note that NO REFILLS for any discharge medications will be authorized once you are discharged, as it is imperative that you return to your primary care physician (or establish a relationship with a primary care physician if you do not have one) for your aftercare needs so that they can reassess your need for medications and monitor your lab values   Increase activity slowly   Complete by: As directed      Allergies as of 04/21/2019      Reactions   Codeine Other (See Comments)   Hallucinations and "la-la land"      Medication List    STOP taking these medications   furosemide 20 MG tablet Commonly known as: LASIX    mycophenolate 500 MG tablet Commonly known as: CELLCEPT     TAKE these medications   acetaminophen 500 MG tablet Commonly known as: TYLENOL Take 1,000 mg by mouth every 6 (six) hours as needed for mild pain, moderate pain or headache.   amoxicillin 500 MG capsule Commonly known as: AMOXIL Take 2,000 mg by mouth See admin instructions. Take 2,000 mg by mouth one hour prior to all dental cleanings and other procedures   atenolol 25 MG tablet Commonly known as: TENORMIN Take 25 mg by mouth daily.   calcitRIOL 0.25 MCG capsule Commonly known as: ROCALTROL Take 0.25 mcg by mouth daily.   colchicine 0.6 MG tablet Take 2 now and 1 in 1 hour.  May repeat dose once daily.  For gout attack. What changed:   how much to take  how to take this  when to take this  additional instructions   [employer] VITAMIN B12 1000 MCG tablet Generic drug: cyanocobalamin Take 2,000 mcg by mouth daily.   DESITIN RAPID RELIEF EX Apply 1 application topically See admin instructions. Apply externally to rectum after each loose bowel episode   ferrous sulfate 325 (65 FE) MG EC tablet Take 325 mg by mouth 3 (three) times daily with meals.  hydrocortisone 25 MG suppository Commonly known as: ANUSOL-HC Place 25 mg rectally 2 (two) times daily. am   loperamide 2 MG tablet Commonly known as: IMODIUM A-D Take 2 mg by mouth 3 (three) times daily as needed for diarrhea or loose stools.   nitazoxanide 500 MG tablet Commonly known as: ALINIA Take 1 tablet (500 mg total) by mouth 2 (two) times daily with a meal for 28 days.   Omega-3 1000 MG Caps Take 1,000 mg by mouth daily with breakfast.   ondansetron 4 MG disintegrating tablet Commonly known as: Zofran ODT Take 1 tablet (4 mg total) by mouth every 8 (eight) hours as needed for nausea or vomiting. What changed: reasons to take this   ONE-A-DAY WOMENS 50+ ADVANTAGE PO Take 1 tablet by mouth daily with breakfast.   pantoprazole 40 MG  tablet Commonly known as: PROTONIX Take 40 mg by mouth daily before breakfast.   potassium chloride SA 20 MEQ tablet Commonly known as: KLOR-CON Take 20 mEq by mouth 2 (two) times daily.   predniSONE 5 MG tablet Commonly known as: DELTASONE Take 1 tablet (5 mg total) by mouth daily with breakfast.   rifaximin 550 MG Tabs tablet Commonly known as: XIFAXAN Take 550 mg by mouth 2 (two) times daily.   sodium bicarbonate 650 MG tablet Take 1,300 mg by mouth 3 (three) times daily.   Synthroid 137 MCG tablet Generic drug: levothyroxine Take 137 mcg by mouth daily before breakfast.   tacrolimus 1 MG capsule Commonly known as: PROGRAF Take 1 mg by mouth in the morning and at bedtime.   Uloric 40 MG tablet Generic drug: febuxostat Take 40 mg by mouth daily.   Vitamin D (Ergocalciferol) 1.25 MG (50000 UNIT) Caps capsule Commonly known as: DRISDOL Take 50,000 Units by mouth every Sunday.       Allergies  Allergen Reactions  . Codeine Other (See Comments)    Hallucinations and "la-la land"    The results of significant diagnostics from this hospitalization (including imaging, microbiology, ancillary and laboratory) are listed below for reference.    Microbiology: Recent Results (from the past 240 hour(s))  SARS CORONAVIRUS 2 (TAT 6-24 HRS) Nasopharyngeal Nasopharyngeal Swab     Status: None   Collection Time: 04/15/19  6:01 PM   Specimen: Nasopharyngeal Swab  Result Value Ref Range Status   SARS Coronavirus 2 NEGATIVE NEGATIVE Final    Comment: (NOTE) SARS-CoV-2 target nucleic acids are NOT DETECTED. The SARS-CoV-2 RNA is generally detectable in upper and lower respiratory specimens during the acute phase of infection. Negative results do not preclude SARS-CoV-2 infection, do not rule out co-infections with other pathogens, and should not be used as the sole basis for treatment or other patient management decisions. Negative results must be combined with clinical  observations, patient history, and epidemiological information. The expected result is Negative. Fact Sheet for Patients: SugarRoll.be Fact Sheet for Healthcare Providers: https://www.woods-mathews.com/ This test is not yet approved or cleared by the [place] FDA and  has been authorized for detection and/or diagnosis of SARS-CoV-2 by FDA under an Emergency Use Authorization (EUA). This EUA will remain  in effect (meaning this test can be used) for the duration of the COVID-19 declaration under Section 56 4(b)(1) of the Act, 21 U.S.C. section 360bbb-3(b)(1), unless the authorization is terminated or revoked sooner. Performed at [facility], [place] [address] [place], [place] [address]   GI pathogen panel by PCR, stool     Status: Abnormal   Collection  Time: 04/16/19 11:58 AM   Specimen: Stool  Result Value Ref Range Status   Plesiomonas shigelloides NOT DETECTED NOT DETECTED Final   Yersinia enterocolitica NOT DETECTED NOT DETECTED Final   Vibrio NOT DETECTED NOT DETECTED Final   Enteropathogenic E coli NOT DETECTED NOT DETECTED Final   E coli (ETEC) LT/ST NOT DETECTED NOT DETECTED Final   E coli 1245 by PCR Not applicable NOT DETECTED Final   Cryptosporidium by PCR NOT DETECTED NOT DETECTED Final   Entamoeba histolytica NOT DETECTED NOT DETECTED Final   Adenovirus F 40/41 NOT DETECTED NOT DETECTED Final   Norovirus GI/GII DETECTED (A) NOT DETECTED Final   Sapovirus NOT DETECTED NOT DETECTED Final    Comment: (NOTE) Performed At: [place], [place] [id number] Rush Farmer MD NK:[id number]    Vibrio cholerae NOT DETECTED NOT DETECTED Final   Campylobacter by PCR NOT DETECTED NOT DETECTED Final   Salmonella by PCR NOT DETECTED NOT DETECTED Final   E coli (STEC) NOT DETECTED NOT DETECTED Final   Enteroaggregative E coli NOT DETECTED NOT DETECTED Final   Shigella by PCR NOT DETECTED NOT  DETECTED Final   Cyclospora cayetanensis NOT DETECTED NOT DETECTED Final   Astrovirus NOT DETECTED NOT DETECTED Final   G lamblia by PCR NOT DETECTED NOT DETECTED Final   Rotavirus A by PCR NOT DETECTED NOT DETECTED Final    Procedures/Studies: No results found.  Labs: BNP (last 3 results) No results for input(s): BNP in the last 8760 hours. Basic Metabolic Panel: Recent Labs  Lab 04/16/19 0305 04/16/19 0305 04/17/19 0222 04/17/19 0222 04/18/19 0602 04/19/19 0126 04/19/19 0932 04/20/19 0319 04/21/19 0232  NA 143   < > 137  --  138 134*  --  135 135  K 2.9*   < > 4.4   < > 4.5 5.6* 4.6 4.2 4.4  CL 121*   < > 114*  --  113* 110  --  108 110  CO2 13*   < > 17*  --  19* 21*  --  20* 18*  GLUCOSE 71   < > 80  --  78 83  --  74 70  BUN 8   < > 11  --  7* 7*  --  9 9  CREATININE 1.14*   < > 0.94  --  0.81 0.81  --  0.86 0.73  CALCIUM 5.0*   < > 6.2*  --  6.8* 7.1*  --  7.5* 7.7*  MG 0.5*  --  1.8  --  1.7 2.2  --  1.5*  --   PHOS  --   --  <1.0*  --  1.6* 2.0*  --  2.9  --    < > = values in this interval not displayed.   Liver Function Tests: Recent Labs  Lab 04/15/19 1537 04/15/19 1537 04/16/19 0305 04/16/19 0305 04/17/19 0222 04/18/19 0602 04/19/19 0126 04/20/19 0319 04/21/19 0232  AST 13*  --  12*  --   --   --   --   --  14*  ALT 14  --  12  --   --   --   --   --  14  ALKPHOS 63  --  54  --   --   --   --   --  45  BILITOT 0.3  --  0.2*  --   --   --   --   --  0.2*  PROT 4.6*  --  3.9*  --   --   --   --   --  4.0*  ALBUMIN 2.8*   < > 2.3*   < > 2.3* 2.4* 2.4* 2.2* 2.3*   < > = values in this interval not displayed.   Recent Labs  Lab 04/15/19 1537  LIPASE 150*   No results for input(s): AMMONIA in the last 168 hours. CBC: Recent Labs  Lab 04/16/19 0305 04/17/19 0222 04/18/19 0602 04/20/19 0319 04/21/19 0232  WBC 5.4 5.5 4.9 4.9 5.0  NEUTROABS  --  4.5 3.9  --   --   HGB 7.7* 7.8* 8.0* 7.7* 7.3*  HCT 24.9* 24.8* 25.7* 24.9* 24.2*  MCV 95.4  93.2 95.2 94.7 98.4  PLT 224 233 251 234 235   Cardiac Enzymes: No results for input(s): CKTOTAL, CKMB, CKMBINDEX, TROPONINI in the last 168 hours. BNP: Invalid input(s): POCBNP CBG: Recent Labs  Lab 04/16/19 0515 04/16/19 0625 04/19/19 0805 04/20/19 0743 04/21/19 0733  GLUCAP 62* 82 80 71 70   D-Dimer No results for input(s): DDIMER in the last 72 hours. Hgb A1c No results for input(s): HGBA1C in the last 72 hours. Lipid Profile No results for input(s): CHOL, HDL, LDLCALC, TRIG, CHOLHDL, LDLDIRECT in the last 72 hours. Thyroid function studies No results for input(s): TSH, T4TOTAL, T3FREE, THYROIDAB in the last 72 hours.  Invalid input(s): FREET3 Anemia work up No results for input(s): VITAMINB12, FOLATE, FERRITIN, TIBC, IRON, RETICCTPCT in the last 72 hours. Urinalysis    Component Value Date/Time   COLORURINE YELLOW 04/15/2019 1633   APPEARANCEUR CLEAR 04/15/2019 1633   LABSPEC 1.013 04/15/2019 1633   PHURINE 5.0 04/15/2019 1633   GLUCOSEU NEGATIVE 04/15/2019 1633   HGBUR NEGATIVE 04/15/2019 1633   BILIRUBINUR NEGATIVE 04/15/2019 1633   [place] 04/15/2019 1633   PROTEINUR 30 (A) 04/15/2019 1633   NITRITE NEGATIVE 04/15/2019 1633   LEUKOCYTESUR TRACE (A) 04/15/2019 1633   Sepsis Labs Invalid input(s): PROCALCITONIN,  WBC,  LACTICIDVEN Microbiology Recent Results (from the past 240 hour(s))  SARS CORONAVIRUS 2 (TAT 6-24 HRS) Nasopharyngeal Nasopharyngeal Swab     Status: None   Collection Time: 04/15/19  6:01 PM   Specimen: Nasopharyngeal Swab  Result Value Ref Range Status   SARS Coronavirus 2 NEGATIVE NEGATIVE Final    Comment: (NOTE) SARS-CoV-2 target nucleic acids are NOT DETECTED. The SARS-CoV-2 RNA is generally detectable in upper and lower respiratory specimens during the acute phase of infection. Negative results do not preclude SARS-CoV-2 infection, do not rule out co-infections with other pathogens, and should not be used as the sole  basis for treatment or other patient management decisions. Negative results must be combined with clinical observations, patient history, and epidemiological information. The expected result is Negative. Fact Sheet for Patients: SugarRoll.be Fact Sheet for Healthcare Providers: https://www.woods-mathews.com/ This test is not yet approved or cleared by the [place] FDA and  has been authorized for detection and/or diagnosis of SARS-CoV-2 by FDA under an Emergency Use Authorization (EUA). This EUA will remain  in effect (meaning this test can be used) for the duration of the COVID-19 declaration under Section 56 4(b)(1) of the Act, 21 U.S.C. section 360bbb-3(b)(1), unless the authorization is terminated or revoked sooner. Performed at [facility], [place] [address] [place], [place] [address]   GI pathogen panel by PCR, stool     Status: Abnormal   Collection Time: 04/16/19 11:58 AM   Specimen: Stool  Result Value  Ref Range Status   Plesiomonas shigelloides NOT DETECTED NOT DETECTED Final   Yersinia enterocolitica NOT DETECTED NOT DETECTED Final   Vibrio NOT DETECTED NOT DETECTED Final   Enteropathogenic E coli NOT DETECTED NOT DETECTED Final   E coli (ETEC) LT/ST NOT DETECTED NOT DETECTED Final   E coli 0814 by PCR Not applicable NOT DETECTED Final   Cryptosporidium by PCR NOT DETECTED NOT DETECTED Final   Entamoeba histolytica NOT DETECTED NOT DETECTED Final   Adenovirus F 40/41 NOT DETECTED NOT DETECTED Final   Norovirus GI/GII DETECTED (A) NOT DETECTED Final   Sapovirus NOT DETECTED NOT DETECTED Final    Comment: (NOTE) Performed At: [facility] [place], [place] [id number] Rush Farmer MD HF:[id number]    Vibrio cholerae NOT DETECTED NOT DETECTED Final   Campylobacter by PCR NOT DETECTED NOT DETECTED Final   Salmonella by PCR NOT DETECTED NOT DETECTED Final   E coli (STEC) NOT DETECTED NOT DETECTED  Final   Enteroaggregative E coli NOT DETECTED NOT DETECTED Final   Shigella by PCR NOT DETECTED NOT DETECTED Final   Cyclospora cayetanensis NOT DETECTED NOT DETECTED Final   Astrovirus NOT DETECTED NOT DETECTED Final   G lamblia by PCR NOT DETECTED NOT DETECTED Final   Rotavirus A by PCR NOT DETECTED NOT DETECTED Final     Time coordinating discharge:  25 minutes  SIGNED: Antonieta Pert, MD  [facility] 04/21/2019, 11:17 AM  If 7PM-7AM, please contact night-coverage [email or web address]

## 2019-04-21 NOTE — Progress Notes (Signed)
AVS given and reviewed with pt. Medications discussed. All questions answered to satisfaction. Pt verbalized understanding of information given. Pt escorted off the unit with all belongings via wheelchair by volunteer services.  

## 2019-04-21 NOTE — Plan of Care (Signed)

## 2019-04-21 NOTE — Progress Notes (Signed)
[facility] Progress Note    Assessment/ Plan:    1.  Kidney transplant: Follows with Dr. Joelyn Oms.  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  slight uptrend in Cr 1.3, last OP Cr 1.2--> better with IVFs. Cr 0.86 today  She looks euvolemic today and is off IVF.  Tac trough 3/23 5.9, continue same dose.  Hold MMF for norovirus and start prednisone 5 daily in meantime to prevent rejection, see below.       2.  Diarrhea: Extensive w/u bland except +norovirus.  I think there is a good chance this is the issue and ID agrees.  Rx'd nitazoxanide - plan start when available likely tomorrow and cont 4 weeks.  D/w [facility] transplant nephrology --> hold MMF for now and reassess in 2 week; wouldn't resume more than 500 BID in future. I have talked to her at length about risk:benefit of holding/reducing MMF; she's understandably scared for rejection but it's low risk and this diarrhea is intolerable.  3.  Electrolyte abnormalities:  Suspected secondary to chronic diarrhea, have been repleted and are now stable.   3.  Anemia: Hgb 7-8, %sat 11, s/p dose of feraheme 3/21.  8.  Dispo: d/c today. [facility] for [facility] on Monday - d/w Shaquina who will order RFP, Mag and CBC.   Subjective:    Cr stable at 0.86.  Electrolytes are improved - no repletion x 48h.  Diarrhea continues. No new symptoms.   Objective:   BP 121/90   Pulse 89   Temp 98 F (36.7 C) (Oral)   Resp 18   Ht 5\' 2"  (1.575 m)   Wt 41.9 kg   LMP 01/16/2012   SpO2 100%   BMI 16.90 kg/m  No intake or output data in the 24 hours ending 04/21/19 1104 Weight change:   Physical Exam: GEN: thin, NAD, sitting in bed, looks fatigued but nontoxic HEENT EOMI PERRL NECK no JVD PULM normal WOB ABD nontender NABS  EXT no LE edema NEURO AAO x3 nonfocal SKIN warm and dry  Imaging: No results found.  Labs: BMET Recent Labs  Lab 04/15/19 1537 04/15/19 1537 04/16/19 0305  04/17/19 0222 04/18/19 0602 04/19/19 0126 04/19/19 0932 04/20/19 0319 04/21/19 0232  NA 145  --  143 137 138 134*  --  135 135  K 3.4*   < > 2.9* 4.4 4.5 5.6* 4.6 4.2 4.4  CL 124*  --  121* 114* 113* 110  --  108 110  CO2 13*  --  13* 17* 19* 21*  --  20* 18*  GLUCOSE 86  --  71 80 78 83  --  74 70  BUN 8  --  8 11 7* 7*  --  9 9  CREATININE 1.22*  --  1.14* 0.94 0.81 0.81  --  0.86 0.73  CALCIUM 5.0*  --  5.0* 6.2* 6.8* 7.1*  --  7.5* 7.7*  PHOS  --   --   --  <1.0* 1.6* 2.0*  --  2.9  --    < > = values in this interval not displayed.   CBC Recent Labs  Lab 04/17/19 0222 04/18/19 0602 04/20/19 0319 04/21/19 0232  WBC 5.5 4.9 4.9 5.0  NEUTROABS 4.5 3.9  --   --   HGB 7.8* 8.0* 7.7* 7.3*  HCT 24.8* 25.7* 24.9* 24.2*  MCV 93.2 95.2 94.7 98.4  PLT 233 251  234 235    Medications:    . atenolol  25 mg Oral Daily  . calcitRIOL  0.25 mcg Oral Daily  . calcium carbonate  2 tablet Oral TID  . enoxaparin (LOVENOX) injection  30 mg Subcutaneous Q24H  . febuxostat  40 mg Oral Daily  . hydrocortisone  25 mg Rectal BID  . levothyroxine  137 mcg Oral QAC breakfast  . multivitamin with minerals   Oral Q breakfast  . mycophenolate  500 mg Oral Q24H  . mycophenolate  500 mg Oral Q24H  . omega-3 acid ethyl esters  1 g Oral Q breakfast  . predniSONE  5 mg Oral Q breakfast  . rifaximin  550 mg Oral BID  . sodium bicarbonate  1,300 mg Oral TID  . tacrolimus  1 mg Oral BID  . cyanocobalamin  2,000 mcg Oral Daily  . Vitamin D (Ergocalciferol)  50,000 Units Oral Q Bartolo Darter MD [facility] Pager [phone]

## 2019-04-22 ENCOUNTER — Telehealth (HOSPITAL_COMMUNITY): Payer: Self-pay

## 2019-04-22 LAB — NOROVIRUS GROUP 1 & 2 BY PCR, STOOL
Norovirus 1 by PCR: NEGATIVE
Norovirus 2  by PCR: NEGATIVE

## 2019-04-22 NOTE — Progress Notes (Signed)
Called and spoke with patient about her Nitazoxanide prescription. Informed her that she can pick it up today from the [facility] Instructed patient on proper administration and potential side effects. Answered all medication questions.   Also informed patient of her upcoming clinic appointment with Dr. Megan Salon on 4/28 at 3:15pm and that she could access additional information through her MyChart.  Patient expressed understanding and intention to pick up her medication today.  Claudina Lick, PharmD Candidate 04/22/2019

## 2019-04-22 NOTE — Progress Notes (Signed)
I supervised the phone call with Claudina Lick and Ms. Nastasi and agree with his documentation.   Jimmy Footman, PharmD, BCPS, [facility] Clinical Pharmacist Phone: [phone]

## 2019-04-23 LAB — 5 HIAA, QUANTITATIVE, URINE, 24 HOUR
5-HIAA, Ur: 1.8 mg/L
5-HIAA,Quant.,24 Hr Urine: 5.4 mg/24 hr (ref 0.0–14.9)
Total Volume: 3000

## 2019-04-25 DIAGNOSIS — Z940 Kidney transplant status: Secondary | ICD-10-CM | POA: Diagnosis not present

## 2019-04-26 DIAGNOSIS — R197 Diarrhea, unspecified: Secondary | ICD-10-CM | POA: Diagnosis not present

## 2019-04-26 DIAGNOSIS — E1022 Type 1 diabetes mellitus with diabetic chronic kidney disease: Secondary | ICD-10-CM | POA: Diagnosis not present

## 2019-05-02 DIAGNOSIS — Z940 Kidney transplant status: Secondary | ICD-10-CM | POA: Diagnosis not present

## 2019-05-10 DIAGNOSIS — Z940 Kidney transplant status: Secondary | ICD-10-CM | POA: Diagnosis not present

## 2019-05-23 DIAGNOSIS — E782 Mixed hyperlipidemia: Secondary | ICD-10-CM | POA: Diagnosis not present

## 2019-05-23 DIAGNOSIS — Z1159 Encounter for screening for other viral diseases: Secondary | ICD-10-CM | POA: Diagnosis not present

## 2019-05-25 ENCOUNTER — Ambulatory Visit: Payer: BC Managed Care – PPO | Admitting: Internal Medicine

## 2019-05-25 ENCOUNTER — Encounter: Payer: Self-pay | Admitting: Internal Medicine

## 2019-05-25 ENCOUNTER — Other Ambulatory Visit: Payer: Self-pay

## 2019-05-25 DIAGNOSIS — Z524 Kidney donor: Secondary | ICD-10-CM | POA: Diagnosis not present

## 2019-05-25 DIAGNOSIS — A0811 Acute gastroenteropathy due to Norwalk agent: Secondary | ICD-10-CM

## 2019-05-25 DIAGNOSIS — I10 Essential (primary) hypertension: Secondary | ICD-10-CM | POA: Diagnosis not present

## 2019-05-25 DIAGNOSIS — Z940 Kidney transplant status: Secondary | ICD-10-CM | POA: Diagnosis not present

## 2019-05-25 DIAGNOSIS — Z0000 Encounter for general adult medical examination without abnormal findings: Secondary | ICD-10-CM | POA: Diagnosis not present

## 2019-05-25 DIAGNOSIS — N189 Chronic kidney disease, unspecified: Secondary | ICD-10-CM | POA: Diagnosis not present

## 2019-05-25 NOTE — Progress Notes (Signed)
[facility]  Patient Active Problem List   Diagnosis Date Noted  . Infection due to Norovirus species 04/19/2019    Priority: High  . Chronic diarrhea 04/15/2019    Priority: High  . H/O kidney transplant 03/16/2013    Priority: Medium  . Hypomagnesemia 04/15/2019  . Hypocalcemia 04/15/2019  . Metabolic acidosis, NAG, bicarbonate losses 04/15/2019  . Cellulitis 04/21/2015  . Hypertension 04/21/2015  . Hyponatremia 04/21/2015  . Infected wound 04/21/2015  . Breast cancer, left breast ([place]) 04/18/2014  . Anemia in neoplastic disease 04/18/2014  . History of left breast cancer 03/16/2013  . Anemia in chronic renal disease 03/16/2013  . Brain aneurysm 03/16/2013  . Hx of primary hypertension 03/16/2013  . Hx gestational diabetes 03/16/2013    Patient's Medications  New Prescriptions   No medications on file  Previous Medications   ACETAMINOPHEN (TYLENOL) 500 MG TABLET    Take 1,000 mg by mouth every 6 (six) hours as needed for mild pain, moderate pain or headache.    AMOXICILLIN (AMOXIL) 500 MG CAPSULE    Take 2,000 mg by mouth See admin instructions. Take 2,000 mg by mouth one hour prior to all dental cleanings and other procedures   ATENOLOL (TENORMIN) 25 MG TABLET    Take 25 mg by mouth daily.    BIOTIN 5000 MCG TABS    Take by mouth.   CALCITRIOL (ROCALTROL) 0.25 MCG CAPSULE    Take 0.25 mcg by mouth daily.   COLCHICINE 0.6 MG TABLET    Take 2 now and 1 in 1 hour.  May repeat dose once daily.  For gout attack.   CVS VITAMIN B12 1000 MCG TABLET    Take 2,000 mcg by mouth daily.   FENOFIBRATE MICRONIZED (ANTARA) 130 MG CAPSULE    Take 130 mg by mouth daily before breakfast.   FERROUS SULFATE 325 (65 FE) MG EC TABLET    Take 325 mg by mouth 3 (three) times daily with meals.   HYDROCORTISONE (ANUSOL-HC) 25 MG SUPPOSITORY    Place 25 mg rectally 2 (two) times daily. am   LOPERAMIDE (IMODIUM A-D) 2 MG TABLET    Take 2 mg by mouth 3 (three)  times daily as needed for diarrhea or loose stools.   MULTIPLE VITAMINS-MINERALS (ONE-A-DAY WOMENS 50+ ADVANTAGE PO)    Take 1 tablet by mouth daily with breakfast.   MYCOPHENOLATE (CELLCEPT) 500 MG TABLET    Take by mouth 2 (two) times daily.   OMEGA-3 1000 MG CAPS    Take 1,000 mg by mouth daily with breakfast.   ONDANSETRON (ZOFRAN ODT) 4 MG DISINTEGRATING TABLET    Take 1 tablet (4 mg total) by mouth every 8 (eight) hours as needed for nausea or vomiting.   PANTOPRAZOLE (PROTONIX) 40 MG TABLET    Take 40 mg by mouth daily before breakfast.   POTASSIUM CHLORIDE SA (KLOR-CON) 20 MEQ TABLET    Take 20 mEq by mouth 2 (two) times daily.   PREDNISONE (DELTASONE) 5 MG TABLET    Take 5 mg by mouth daily with breakfast.   RIFAXIMIN (XIFAXAN) 550 MG TABS TABLET    Take 550 mg by mouth 2 (two) times daily.   SODIUM BICARBONATE 650 MG TABLET    Take 1,300 mg by mouth 3 (three) times daily.   SYNTHROID 137 MCG TABLET    Take 137 mcg by mouth daily before breakfast.    TACROLIMUS (PROGRAF) 1 MG CAPSULE  Take 1 mg by mouth in the morning and at bedtime.    ULORIC 40 MG TABLET    Take 40 mg by mouth daily.    VITAMIN D, ERGOCALCIFEROL, (DRISDOL) 50000 UNITS CAPS CAPSULE    Take 50,000 Units by mouth every Sunday.    ZINC OXIDE (DESITIN RAPID RELIEF EX)    Apply 1 application topically See admin instructions. Apply externally to rectum after each loose bowel episode  Modified Medications   No medications on file  Discontinued Medications   No medications on file    Subjective: Vanessa [name] is in for her hospital follow-up visit.  She has a history of renal transplant 17 years ago. She developed diarrhea last November.  She has had chronic diarrhea ever since and has undergone an extensive evaluation by her gastroenterologist.  She had a normal colonoscopy.  She had mild gastritis on EGD with no evidence of Helicobacter pylori.  No findings on abdominal CT scans.  Celiac serology was negative.   Norovirus was found on a GI pathogen panel in January.  She has attempted to manage her diarrhea with over-the-counter Imodium.  She usually takes 2 to 3 tablets daily with only partial relief.  She has not noted any clear pattern in her diarrhea relative to her diet.  She was recently started on a trial of rifaximin by her gastroenterologist.  She took 2 doses but was called by her nephrologist and told to come to the hospital for admission on 04/15/2018 because there were significant abnormalities in her blood work.  She had a non anion gap metabolic acidosis with hypokalemia, hypocalcemia and hypomagnesemia.  C. difficile PCR was negative.  Norovirus was again positive on her GI pathogen panel.  Her mycophenolate was discontinued upon discharge.  I elected to treat her with nitazoxanide.  She completed 28-day course of therapy on 05/20/2019.  She is feeling much better.  She is now having soft stool about every other day as opposed to multiple liquid stools daily.  Her appetite is improved and she is gaining weight.  Review of Systems: Review of Systems  Constitutional: Negative for chills, fever and weight loss.  Gastrointestinal: Negative for abdominal pain, diarrhea, nausea and vomiting.    Past Medical History:  Diagnosis Date  . Anemia in chronic kidney disease(285.21) 03/16/2013  . Brain aneurysm 03/16/2013   Age 59 (1983)  . H/O kidney transplant 03/16/2013  . History of left breast cancer 03/16/2013  . Hx gestational diabetes 03/16/2013  . Hx of primary hypertension 03/16/2013  . Hypertension   . Hypomagnesemia   . Kidney transplant recipient     Social History   Tobacco Use  . Smoking status: Former Smoker    Packs/day: 0.50    Years: 2.00    Pack years: 1.00    Types: Cigarettes  . Smokeless tobacco: Never Used  Substance Use Topics  . Alcohol use: No  . Drug use: No    Family History  Problem Relation Age of Onset  . Brain cancer Mother   . Diabetes type II Father   .  Parkinson's disease Father     Allergies  Allergen Reactions  . Codeine Other (See Comments)    Hallucinations and "[place]"    Objective: Vitals:   05/25/19 1448  BP: (!) 155/85  Pulse: 68  Temp: 98.1 F (36.7 C)  TempSrc: Oral  Weight: 106 lb 6.4 oz (48.3 kg)   Body mass index is 19.46 kg/m.  Physical Exam Constitutional:  Comments: She is in good spirits.  Her weight is up 14 pounds.  Cardiovascular:     Rate and Rhythm: Normal rate and regular rhythm.     Heart sounds: No murmur.  Pulmonary:     Effort: Pulmonary effort is normal.     Breath sounds: Normal breath sounds.  Abdominal:     Palpations: Abdomen is soft.     Tenderness: There is no abdominal tenderness.  Psychiatric:        Mood and Affect: Mood normal.     Lab Results    Problem List Items Addressed This Visit      High   Infection due to Norovirus species    She had significant improvement in her chronic diarrhea coincident with holding her mycophenolate and reducing immunosuppression and taking a 28-day course of nitazoxanide.  I am comfortable with her restarting mycophenolate now if Dr. Joelyn Oms, her nephrologist recommend so.  I will see her back in 6 weeks.      Relevant Medications   mycophenolate (CELLCEPT) 500 MG tablet       Michel Bickers, MD [facility] for Infectious [place] [phone] pager   [phone] cell 05/25/2019, 3:31 PM

## 2019-05-25 NOTE — Assessment & Plan Note (Signed)
She had significant improvement in her chronic diarrhea coincident with holding her mycophenolate and reducing immunosuppression and taking a 28-day course of nitazoxanide.  I am comfortable with her restarting mycophenolate now if Dr. Joelyn Oms, her nephrologist recommend so.  I will see her back in 6 weeks.

## 2019-05-30 DIAGNOSIS — E559 Vitamin D deficiency, unspecified: Secondary | ICD-10-CM | POA: Diagnosis not present

## 2019-05-30 DIAGNOSIS — Z940 Kidney transplant status: Secondary | ICD-10-CM | POA: Diagnosis not present

## 2019-05-30 DIAGNOSIS — R197 Diarrhea, unspecified: Secondary | ICD-10-CM | POA: Diagnosis not present

## 2019-05-30 DIAGNOSIS — I10 Essential (primary) hypertension: Secondary | ICD-10-CM | POA: Diagnosis not present

## 2019-05-30 DIAGNOSIS — D638 Anemia in other chronic diseases classified elsewhere: Secondary | ICD-10-CM | POA: Diagnosis not present

## 2019-05-30 DIAGNOSIS — E782 Mixed hyperlipidemia: Secondary | ICD-10-CM | POA: Diagnosis not present

## 2019-05-30 DIAGNOSIS — D631 Anemia in chronic kidney disease: Secondary | ICD-10-CM | POA: Diagnosis not present

## 2019-05-30 DIAGNOSIS — E790 Hyperuricemia without signs of inflammatory arthritis and tophaceous disease: Secondary | ICD-10-CM | POA: Diagnosis not present

## 2019-05-30 DIAGNOSIS — E119 Type 2 diabetes mellitus without complications: Secondary | ICD-10-CM | POA: Diagnosis not present

## 2019-05-30 DIAGNOSIS — E039 Hypothyroidism, unspecified: Secondary | ICD-10-CM | POA: Diagnosis not present

## 2019-05-30 DIAGNOSIS — M25511 Pain in right shoulder: Secondary | ICD-10-CM | POA: Diagnosis not present

## 2019-06-01 DIAGNOSIS — Z940 Kidney transplant status: Secondary | ICD-10-CM | POA: Diagnosis not present

## 2019-06-01 DIAGNOSIS — I129 Hypertensive chronic kidney disease with stage 1 through stage 4 chronic kidney disease, or unspecified chronic kidney disease: Secondary | ICD-10-CM | POA: Diagnosis not present

## 2019-06-01 DIAGNOSIS — E1122 Type 2 diabetes mellitus with diabetic chronic kidney disease: Secondary | ICD-10-CM | POA: Diagnosis not present

## 2019-06-01 DIAGNOSIS — N182 Chronic kidney disease, stage 2 (mild): Secondary | ICD-10-CM | POA: Diagnosis not present

## 2019-06-03 DIAGNOSIS — Z940 Kidney transplant status: Secondary | ICD-10-CM | POA: Diagnosis not present

## 2019-06-08 ENCOUNTER — Other Ambulatory Visit: Payer: Self-pay | Admitting: Family Medicine

## 2019-06-08 ENCOUNTER — Other Ambulatory Visit (HOSPITAL_COMMUNITY)
Admission: RE | Admit: 2019-06-08 | Discharge: 2019-06-08 | Disposition: A | Discharge status: Home / Self Care | Payer: BC Managed Care – PPO | Source: Ambulatory Visit | Attending: Family Medicine | Admitting: Family Medicine

## 2019-06-08 DIAGNOSIS — E2839 Other primary ovarian failure: Secondary | ICD-10-CM

## 2019-06-08 DIAGNOSIS — S46011D Strain of muscle(s) and tendon(s) of the rotator cuff of right shoulder, subsequent encounter: Secondary | ICD-10-CM | POA: Diagnosis not present

## 2019-06-08 DIAGNOSIS — M25611 Stiffness of right shoulder, not elsewhere classified: Secondary | ICD-10-CM | POA: Diagnosis not present

## 2019-06-08 DIAGNOSIS — Z01419 Encounter for gynecological examination (general) (routine) without abnormal findings: Secondary | ICD-10-CM | POA: Diagnosis not present

## 2019-06-08 DIAGNOSIS — M6281 Muscle weakness (generalized): Secondary | ICD-10-CM | POA: Diagnosis not present

## 2019-06-08 DIAGNOSIS — M7541 Impingement syndrome of right shoulder: Secondary | ICD-10-CM | POA: Diagnosis not present

## 2019-06-08 DIAGNOSIS — Z23 Encounter for immunization: Secondary | ICD-10-CM | POA: Diagnosis not present

## 2019-06-09 LAB — CYTOLOGY - PAP
Comment: NEGATIVE
Diagnosis: NEGATIVE
High risk HPV: NEGATIVE

## 2019-06-20 DIAGNOSIS — M7541 Impingement syndrome of right shoulder: Secondary | ICD-10-CM | POA: Diagnosis not present

## 2019-06-20 DIAGNOSIS — S46011D Strain of muscle(s) and tendon(s) of the rotator cuff of right shoulder, subsequent encounter: Secondary | ICD-10-CM | POA: Diagnosis not present

## 2019-06-20 DIAGNOSIS — M6281 Muscle weakness (generalized): Secondary | ICD-10-CM | POA: Diagnosis not present

## 2019-06-20 DIAGNOSIS — M25611 Stiffness of right shoulder, not elsewhere classified: Secondary | ICD-10-CM | POA: Diagnosis not present

## 2019-06-22 DIAGNOSIS — N189 Chronic kidney disease, unspecified: Secondary | ICD-10-CM | POA: Diagnosis not present

## 2019-06-22 DIAGNOSIS — Z940 Kidney transplant status: Secondary | ICD-10-CM | POA: Diagnosis not present

## 2019-06-22 DIAGNOSIS — I10 Essential (primary) hypertension: Secondary | ICD-10-CM | POA: Diagnosis not present

## 2019-06-22 DIAGNOSIS — N2581 Secondary hyperparathyroidism of renal origin: Secondary | ICD-10-CM | POA: Diagnosis not present

## 2019-07-04 DIAGNOSIS — S46011D Strain of muscle(s) and tendon(s) of the rotator cuff of right shoulder, subsequent encounter: Secondary | ICD-10-CM | POA: Diagnosis not present

## 2019-07-04 DIAGNOSIS — M6281 Muscle weakness (generalized): Secondary | ICD-10-CM | POA: Diagnosis not present

## 2019-07-04 DIAGNOSIS — M7541 Impingement syndrome of right shoulder: Secondary | ICD-10-CM | POA: Diagnosis not present

## 2019-07-04 DIAGNOSIS — M25611 Stiffness of right shoulder, not elsewhere classified: Secondary | ICD-10-CM | POA: Diagnosis not present

## 2019-07-06 ENCOUNTER — Ambulatory Visit: Payer: BC Managed Care – PPO | Admitting: Internal Medicine

## 2019-07-22 DIAGNOSIS — Z940 Kidney transplant status: Secondary | ICD-10-CM | POA: Diagnosis not present

## 2019-07-22 DIAGNOSIS — N189 Chronic kidney disease, unspecified: Secondary | ICD-10-CM | POA: Diagnosis not present

## 2019-07-22 DIAGNOSIS — E872 Acidosis: Secondary | ICD-10-CM | POA: Diagnosis not present

## 2019-07-22 DIAGNOSIS — E559 Vitamin D deficiency, unspecified: Secondary | ICD-10-CM | POA: Diagnosis not present

## 2019-07-27 ENCOUNTER — Other Ambulatory Visit: Payer: Self-pay | Admitting: Family Medicine

## 2019-07-27 DIAGNOSIS — Z1231 Encounter for screening mammogram for malignant neoplasm of breast: Secondary | ICD-10-CM

## 2019-08-05 DIAGNOSIS — M25611 Stiffness of right shoulder, not elsewhere classified: Secondary | ICD-10-CM | POA: Diagnosis not present

## 2019-08-05 DIAGNOSIS — M25511 Pain in right shoulder: Secondary | ICD-10-CM | POA: Diagnosis not present

## 2019-08-05 DIAGNOSIS — M7541 Impingement syndrome of right shoulder: Secondary | ICD-10-CM | POA: Diagnosis not present

## 2019-08-05 DIAGNOSIS — M6281 Muscle weakness (generalized): Secondary | ICD-10-CM | POA: Diagnosis not present

## 2019-08-19 DIAGNOSIS — Z940 Kidney transplant status: Secondary | ICD-10-CM | POA: Diagnosis not present

## 2019-08-30 DIAGNOSIS — I10 Essential (primary) hypertension: Secondary | ICD-10-CM | POA: Diagnosis not present

## 2019-08-30 DIAGNOSIS — E039 Hypothyroidism, unspecified: Secondary | ICD-10-CM | POA: Diagnosis not present

## 2019-08-30 DIAGNOSIS — E042 Nontoxic multinodular goiter: Secondary | ICD-10-CM | POA: Diagnosis not present

## 2019-08-30 DIAGNOSIS — E790 Hyperuricemia without signs of inflammatory arthritis and tophaceous disease: Secondary | ICD-10-CM | POA: Diagnosis not present

## 2019-08-30 DIAGNOSIS — E782 Mixed hyperlipidemia: Secondary | ICD-10-CM | POA: Diagnosis not present

## 2019-08-30 DIAGNOSIS — E559 Vitamin D deficiency, unspecified: Secondary | ICD-10-CM | POA: Diagnosis not present

## 2019-08-30 DIAGNOSIS — D638 Anemia in other chronic diseases classified elsewhere: Secondary | ICD-10-CM | POA: Diagnosis not present

## 2019-08-30 DIAGNOSIS — E119 Type 2 diabetes mellitus without complications: Secondary | ICD-10-CM | POA: Diagnosis not present

## 2019-08-30 DIAGNOSIS — Z940 Kidney transplant status: Secondary | ICD-10-CM | POA: Diagnosis not present

## 2019-08-30 DIAGNOSIS — N182 Chronic kidney disease, stage 2 (mild): Secondary | ICD-10-CM | POA: Diagnosis not present

## 2019-09-01 DIAGNOSIS — E119 Type 2 diabetes mellitus without complications: Secondary | ICD-10-CM | POA: Diagnosis not present

## 2019-09-01 DIAGNOSIS — I129 Hypertensive chronic kidney disease with stage 1 through stage 4 chronic kidney disease, or unspecified chronic kidney disease: Secondary | ICD-10-CM | POA: Diagnosis not present

## 2019-09-01 DIAGNOSIS — Z940 Kidney transplant status: Secondary | ICD-10-CM | POA: Diagnosis not present

## 2019-09-01 DIAGNOSIS — E782 Mixed hyperlipidemia: Secondary | ICD-10-CM | POA: Diagnosis not present

## 2019-09-02 ENCOUNTER — Ambulatory Visit
Admission: RE | Admit: 2019-09-02 | Discharge: 2019-09-02 | Disposition: A | Discharge status: Home / Self Care | Payer: BC Managed Care – PPO | Source: Ambulatory Visit | Attending: Family Medicine | Admitting: Family Medicine

## 2019-09-02 ENCOUNTER — Other Ambulatory Visit: Payer: Self-pay

## 2019-09-02 DIAGNOSIS — Z1231 Encounter for screening mammogram for malignant neoplasm of breast: Secondary | ICD-10-CM

## 2019-09-13 DIAGNOSIS — Z940 Kidney transplant status: Secondary | ICD-10-CM | POA: Diagnosis not present

## 2019-09-13 DIAGNOSIS — N2581 Secondary hyperparathyroidism of renal origin: Secondary | ICD-10-CM | POA: Diagnosis not present

## 2019-09-13 DIAGNOSIS — N189 Chronic kidney disease, unspecified: Secondary | ICD-10-CM | POA: Diagnosis not present

## 2019-09-13 DIAGNOSIS — I10 Essential (primary) hypertension: Secondary | ICD-10-CM | POA: Diagnosis not present

## 2019-09-14 DIAGNOSIS — M7541 Impingement syndrome of right shoulder: Secondary | ICD-10-CM | POA: Diagnosis not present

## 2019-09-14 DIAGNOSIS — M25611 Stiffness of right shoulder, not elsewhere classified: Secondary | ICD-10-CM | POA: Diagnosis not present

## 2019-09-14 DIAGNOSIS — M25511 Pain in right shoulder: Secondary | ICD-10-CM | POA: Diagnosis not present

## 2019-09-14 DIAGNOSIS — M6281 Muscle weakness (generalized): Secondary | ICD-10-CM | POA: Diagnosis not present

## 2019-09-30 DIAGNOSIS — Z940 Kidney transplant status: Secondary | ICD-10-CM | POA: Diagnosis not present

## 2019-10-10 DIAGNOSIS — Z23 Encounter for immunization: Secondary | ICD-10-CM | POA: Diagnosis not present

## 2019-10-25 ENCOUNTER — Other Ambulatory Visit: Payer: Self-pay

## 2019-10-25 ENCOUNTER — Ambulatory Visit
Admission: RE | Admit: 2019-10-25 | Discharge: 2019-10-25 | Disposition: A | Discharge status: Home / Self Care | Payer: BC Managed Care – PPO | Source: Ambulatory Visit | Attending: Family Medicine | Admitting: Family Medicine

## 2019-10-25 DIAGNOSIS — M810 Age-related osteoporosis without current pathological fracture: Secondary | ICD-10-CM | POA: Diagnosis not present

## 2019-10-25 DIAGNOSIS — E2839 Other primary ovarian failure: Secondary | ICD-10-CM

## 2019-10-25 DIAGNOSIS — Z780 Asymptomatic menopausal state: Secondary | ICD-10-CM | POA: Diagnosis not present

## 2019-11-02 DIAGNOSIS — Z23 Encounter for immunization: Secondary | ICD-10-CM | POA: Diagnosis not present

## 2019-11-07 DIAGNOSIS — Z940 Kidney transplant status: Secondary | ICD-10-CM | POA: Diagnosis not present

## 2019-12-01 DIAGNOSIS — Z961 Presence of intraocular lens: Secondary | ICD-10-CM | POA: Diagnosis not present

## 2019-12-01 DIAGNOSIS — E119 Type 2 diabetes mellitus without complications: Secondary | ICD-10-CM | POA: Diagnosis not present

## 2019-12-19 DIAGNOSIS — Z940 Kidney transplant status: Secondary | ICD-10-CM | POA: Diagnosis not present

## 2020-01-09 DIAGNOSIS — Z940 Kidney transplant status: Secondary | ICD-10-CM | POA: Diagnosis not present

## 2020-01-09 DIAGNOSIS — E119 Type 2 diabetes mellitus without complications: Secondary | ICD-10-CM | POA: Diagnosis not present

## 2020-01-09 DIAGNOSIS — E782 Mixed hyperlipidemia: Secondary | ICD-10-CM | POA: Diagnosis not present

## 2020-01-09 DIAGNOSIS — E559 Vitamin D deficiency, unspecified: Secondary | ICD-10-CM | POA: Diagnosis not present

## 2020-01-09 DIAGNOSIS — E039 Hypothyroidism, unspecified: Secondary | ICD-10-CM | POA: Diagnosis not present

## 2020-01-09 DIAGNOSIS — E042 Nontoxic multinodular goiter: Secondary | ICD-10-CM | POA: Diagnosis not present

## 2020-01-09 DIAGNOSIS — I10 Essential (primary) hypertension: Secondary | ICD-10-CM | POA: Diagnosis not present

## 2020-01-09 DIAGNOSIS — D638 Anemia in other chronic diseases classified elsewhere: Secondary | ICD-10-CM | POA: Diagnosis not present

## 2020-01-09 DIAGNOSIS — E790 Hyperuricemia without signs of inflammatory arthritis and tophaceous disease: Secondary | ICD-10-CM | POA: Diagnosis not present

## 2020-01-09 DIAGNOSIS — N182 Chronic kidney disease, stage 2 (mild): Secondary | ICD-10-CM | POA: Diagnosis not present

## 2020-01-23 DIAGNOSIS — Z940 Kidney transplant status: Secondary | ICD-10-CM | POA: Diagnosis not present

## 2020-01-23 DIAGNOSIS — E1122 Type 2 diabetes mellitus with diabetic chronic kidney disease: Secondary | ICD-10-CM | POA: Diagnosis not present

## 2020-01-23 DIAGNOSIS — I129 Hypertensive chronic kidney disease with stage 1 through stage 4 chronic kidney disease, or unspecified chronic kidney disease: Secondary | ICD-10-CM | POA: Diagnosis not present

## 2020-01-23 DIAGNOSIS — N182 Chronic kidney disease, stage 2 (mild): Secondary | ICD-10-CM | POA: Diagnosis not present

## 2020-02-27 DIAGNOSIS — E559 Vitamin D deficiency, unspecified: Secondary | ICD-10-CM | POA: Diagnosis not present

## 2020-02-27 DIAGNOSIS — Z940 Kidney transplant status: Secondary | ICD-10-CM | POA: Diagnosis not present

## 2020-03-02 DIAGNOSIS — N189 Chronic kidney disease, unspecified: Secondary | ICD-10-CM | POA: Diagnosis not present

## 2020-03-15 DIAGNOSIS — I10 Essential (primary) hypertension: Secondary | ICD-10-CM | POA: Diagnosis not present

## 2020-03-15 DIAGNOSIS — N2581 Secondary hyperparathyroidism of renal origin: Secondary | ICD-10-CM | POA: Diagnosis not present

## 2020-03-15 DIAGNOSIS — N189 Chronic kidney disease, unspecified: Secondary | ICD-10-CM | POA: Diagnosis not present

## 2020-03-15 DIAGNOSIS — Z940 Kidney transplant status: Secondary | ICD-10-CM | POA: Diagnosis not present

## 2020-04-18 DIAGNOSIS — E782 Mixed hyperlipidemia: Secondary | ICD-10-CM | POA: Diagnosis not present

## 2020-04-18 DIAGNOSIS — E039 Hypothyroidism, unspecified: Secondary | ICD-10-CM | POA: Diagnosis not present

## 2020-04-18 DIAGNOSIS — E790 Hyperuricemia without signs of inflammatory arthritis and tophaceous disease: Secondary | ICD-10-CM | POA: Diagnosis not present

## 2020-04-18 DIAGNOSIS — D638 Anemia in other chronic diseases classified elsewhere: Secondary | ICD-10-CM | POA: Diagnosis not present

## 2020-04-18 DIAGNOSIS — N182 Chronic kidney disease, stage 2 (mild): Secondary | ICD-10-CM | POA: Diagnosis not present

## 2020-04-18 DIAGNOSIS — E559 Vitamin D deficiency, unspecified: Secondary | ICD-10-CM | POA: Diagnosis not present

## 2020-04-18 DIAGNOSIS — I10 Essential (primary) hypertension: Secondary | ICD-10-CM | POA: Diagnosis not present

## 2020-04-18 DIAGNOSIS — E119 Type 2 diabetes mellitus without complications: Secondary | ICD-10-CM | POA: Diagnosis not present

## 2020-04-18 DIAGNOSIS — Z940 Kidney transplant status: Secondary | ICD-10-CM | POA: Diagnosis not present

## 2020-04-20 DIAGNOSIS — D509 Iron deficiency anemia, unspecified: Secondary | ICD-10-CM | POA: Diagnosis not present

## 2020-04-20 DIAGNOSIS — I10 Essential (primary) hypertension: Secondary | ICD-10-CM | POA: Diagnosis not present

## 2020-04-20 DIAGNOSIS — E119 Type 2 diabetes mellitus without complications: Secondary | ICD-10-CM | POA: Diagnosis not present

## 2020-04-20 DIAGNOSIS — E782 Mixed hyperlipidemia: Secondary | ICD-10-CM | POA: Diagnosis not present

## 2020-06-28 DIAGNOSIS — D539 Nutritional anemia, unspecified: Secondary | ICD-10-CM | POA: Diagnosis not present

## 2020-06-28 DIAGNOSIS — Z940 Kidney transplant status: Secondary | ICD-10-CM | POA: Diagnosis not present

## 2020-06-28 DIAGNOSIS — E559 Vitamin D deficiency, unspecified: Secondary | ICD-10-CM | POA: Diagnosis not present

## 2020-06-28 DIAGNOSIS — D631 Anemia in chronic kidney disease: Secondary | ICD-10-CM | POA: Diagnosis not present

## 2020-07-18 DIAGNOSIS — E559 Vitamin D deficiency, unspecified: Secondary | ICD-10-CM | POA: Diagnosis not present

## 2020-07-18 DIAGNOSIS — E790 Hyperuricemia without signs of inflammatory arthritis and tophaceous disease: Secondary | ICD-10-CM | POA: Diagnosis not present

## 2020-07-18 DIAGNOSIS — E119 Type 2 diabetes mellitus without complications: Secondary | ICD-10-CM | POA: Diagnosis not present

## 2020-07-18 DIAGNOSIS — N182 Chronic kidney disease, stage 2 (mild): Secondary | ICD-10-CM | POA: Diagnosis not present

## 2020-07-18 DIAGNOSIS — D638 Anemia in other chronic diseases classified elsewhere: Secondary | ICD-10-CM | POA: Diagnosis not present

## 2020-07-18 DIAGNOSIS — E039 Hypothyroidism, unspecified: Secondary | ICD-10-CM | POA: Diagnosis not present

## 2020-07-18 DIAGNOSIS — I10 Essential (primary) hypertension: Secondary | ICD-10-CM | POA: Diagnosis not present

## 2020-07-18 DIAGNOSIS — E782 Mixed hyperlipidemia: Secondary | ICD-10-CM | POA: Diagnosis not present

## 2020-07-18 DIAGNOSIS — Z940 Kidney transplant status: Secondary | ICD-10-CM | POA: Diagnosis not present

## 2020-07-20 DIAGNOSIS — E119 Type 2 diabetes mellitus without complications: Secondary | ICD-10-CM | POA: Diagnosis not present

## 2020-07-20 DIAGNOSIS — N182 Chronic kidney disease, stage 2 (mild): Secondary | ICD-10-CM | POA: Diagnosis not present

## 2020-07-20 DIAGNOSIS — E1122 Type 2 diabetes mellitus with diabetic chronic kidney disease: Secondary | ICD-10-CM | POA: Diagnosis not present

## 2020-07-20 DIAGNOSIS — I129 Hypertensive chronic kidney disease with stage 1 through stage 4 chronic kidney disease, or unspecified chronic kidney disease: Secondary | ICD-10-CM | POA: Diagnosis not present

## 2020-07-31 DIAGNOSIS — Z23 Encounter for immunization: Secondary | ICD-10-CM | POA: Diagnosis not present

## 2020-08-02 ENCOUNTER — Other Ambulatory Visit: Payer: Self-pay | Admitting: Family Medicine

## 2020-08-02 DIAGNOSIS — Z1231 Encounter for screening mammogram for malignant neoplasm of breast: Secondary | ICD-10-CM

## 2020-08-15 DIAGNOSIS — K6289 Other specified diseases of anus and rectum: Secondary | ICD-10-CM | POA: Diagnosis not present

## 2020-08-15 DIAGNOSIS — Z800 Family history of malignant neoplasm of digestive organs: Secondary | ICD-10-CM | POA: Diagnosis not present

## 2020-08-24 DIAGNOSIS — Z940 Kidney transplant status: Secondary | ICD-10-CM | POA: Diagnosis not present

## 2020-09-04 DIAGNOSIS — K6289 Other specified diseases of anus and rectum: Secondary | ICD-10-CM | POA: Diagnosis not present

## 2020-09-04 DIAGNOSIS — C44520 Squamous cell carcinoma of anal skin: Secondary | ICD-10-CM | POA: Diagnosis not present

## 2020-09-04 DIAGNOSIS — D012 Carcinoma in situ of rectum: Secondary | ICD-10-CM | POA: Diagnosis not present

## 2020-09-04 DIAGNOSIS — K629 Disease of anus and rectum, unspecified: Secondary | ICD-10-CM | POA: Diagnosis not present

## 2020-09-13 DIAGNOSIS — K629 Disease of anus and rectum, unspecified: Secondary | ICD-10-CM | POA: Diagnosis not present

## 2020-09-13 DIAGNOSIS — C210 Malignant neoplasm of anus, unspecified: Secondary | ICD-10-CM | POA: Diagnosis not present

## 2020-09-13 DIAGNOSIS — K6289 Other specified diseases of anus and rectum: Secondary | ICD-10-CM | POA: Diagnosis not present

## 2020-09-14 DIAGNOSIS — Z940 Kidney transplant status: Secondary | ICD-10-CM | POA: Diagnosis not present

## 2020-09-14 DIAGNOSIS — I10 Essential (primary) hypertension: Secondary | ICD-10-CM | POA: Diagnosis not present

## 2020-09-14 DIAGNOSIS — N2581 Secondary hyperparathyroidism of renal origin: Secondary | ICD-10-CM | POA: Diagnosis not present

## 2020-09-14 DIAGNOSIS — N189 Chronic kidney disease, unspecified: Secondary | ICD-10-CM | POA: Diagnosis not present

## 2020-09-20 DIAGNOSIS — N8500 Endometrial hyperplasia, unspecified: Secondary | ICD-10-CM | POA: Diagnosis not present

## 2020-09-20 DIAGNOSIS — C44520 Squamous cell carcinoma of anal skin: Secondary | ICD-10-CM | POA: Diagnosis not present

## 2020-09-20 DIAGNOSIS — C4492 Squamous cell carcinoma of skin, unspecified: Secondary | ICD-10-CM | POA: Diagnosis not present

## 2020-09-20 DIAGNOSIS — D259 Leiomyoma of uterus, unspecified: Secondary | ICD-10-CM | POA: Diagnosis not present

## 2020-09-20 DIAGNOSIS — C218 Malignant neoplasm of overlapping sites of rectum, anus and anal canal: Secondary | ICD-10-CM | POA: Diagnosis not present

## 2020-09-20 DIAGNOSIS — C210 Malignant neoplasm of anus, unspecified: Secondary | ICD-10-CM | POA: Diagnosis not present

## 2020-09-24 DIAGNOSIS — C189 Malignant neoplasm of colon, unspecified: Secondary | ICD-10-CM | POA: Diagnosis not present

## 2020-09-24 DIAGNOSIS — D259 Leiomyoma of uterus, unspecified: Secondary | ICD-10-CM | POA: Diagnosis not present

## 2020-09-24 DIAGNOSIS — J811 Chronic pulmonary edema: Secondary | ICD-10-CM | POA: Diagnosis not present

## 2020-09-24 DIAGNOSIS — K6289 Other specified diseases of anus and rectum: Secondary | ICD-10-CM | POA: Diagnosis not present

## 2020-09-24 DIAGNOSIS — E041 Nontoxic single thyroid nodule: Secondary | ICD-10-CM | POA: Diagnosis not present

## 2020-09-24 DIAGNOSIS — I700 Atherosclerosis of aorta: Secondary | ICD-10-CM | POA: Diagnosis not present

## 2020-09-24 DIAGNOSIS — Z85048 Personal history of other malignant neoplasm of rectum, rectosigmoid junction, and anus: Secondary | ICD-10-CM | POA: Diagnosis not present

## 2020-09-26 ENCOUNTER — Ambulatory Visit: Payer: BC Managed Care – PPO

## 2020-09-26 DIAGNOSIS — K6289 Other specified diseases of anus and rectum: Secondary | ICD-10-CM | POA: Diagnosis not present

## 2020-09-26 DIAGNOSIS — C210 Malignant neoplasm of anus, unspecified: Secondary | ICD-10-CM | POA: Diagnosis not present

## 2020-09-26 DIAGNOSIS — K629 Disease of anus and rectum, unspecified: Secondary | ICD-10-CM | POA: Diagnosis not present

## 2020-09-26 DIAGNOSIS — Z923 Personal history of irradiation: Secondary | ICD-10-CM | POA: Diagnosis not present

## 2020-10-04 DIAGNOSIS — C210 Malignant neoplasm of anus, unspecified: Secondary | ICD-10-CM | POA: Diagnosis not present

## 2020-10-10 DIAGNOSIS — C210 Malignant neoplasm of anus, unspecified: Secondary | ICD-10-CM | POA: Diagnosis not present

## 2020-10-10 DIAGNOSIS — Q632 Ectopic kidney: Secondary | ICD-10-CM | POA: Diagnosis not present

## 2020-10-10 DIAGNOSIS — Z940 Kidney transplant status: Secondary | ICD-10-CM | POA: Diagnosis not present

## 2020-10-11 DIAGNOSIS — E041 Nontoxic single thyroid nodule: Secondary | ICD-10-CM | POA: Diagnosis not present

## 2020-10-11 DIAGNOSIS — C210 Malignant neoplasm of anus, unspecified: Secondary | ICD-10-CM | POA: Diagnosis not present

## 2020-10-11 DIAGNOSIS — I10 Essential (primary) hypertension: Secondary | ICD-10-CM | POA: Diagnosis not present

## 2020-10-11 DIAGNOSIS — I2510 Atherosclerotic heart disease of native coronary artery without angina pectoris: Secondary | ICD-10-CM | POA: Diagnosis not present

## 2020-10-11 DIAGNOSIS — C44520 Squamous cell carcinoma of anal skin: Secondary | ICD-10-CM | POA: Diagnosis not present

## 2020-10-11 DIAGNOSIS — I700 Atherosclerosis of aorta: Secondary | ICD-10-CM | POA: Diagnosis not present

## 2020-10-16 ENCOUNTER — Ambulatory Visit
Admission: RE | Admit: 2020-10-16 | Discharge: 2020-10-16 | Disposition: A | Discharge status: Home / Self Care | Payer: BC Managed Care – PPO | Source: Ambulatory Visit | Attending: Family Medicine | Admitting: Family Medicine

## 2020-10-16 ENCOUNTER — Other Ambulatory Visit: Payer: Self-pay

## 2020-10-16 DIAGNOSIS — Z1231 Encounter for screening mammogram for malignant neoplasm of breast: Secondary | ICD-10-CM | POA: Diagnosis not present

## 2020-10-16 HISTORY — DX: Malignant neoplasm of unspecified site of unspecified female breast: C50.919

## 2020-10-16 HISTORY — DX: Personal history of irradiation: Z92.3

## 2020-10-17 DIAGNOSIS — Z940 Kidney transplant status: Secondary | ICD-10-CM | POA: Diagnosis not present

## 2020-10-17 DIAGNOSIS — C210 Malignant neoplasm of anus, unspecified: Secondary | ICD-10-CM | POA: Diagnosis not present

## 2020-10-17 DIAGNOSIS — Z853 Personal history of malignant neoplasm of breast: Secondary | ICD-10-CM | POA: Diagnosis not present

## 2020-10-17 DIAGNOSIS — Z510 Encounter for antineoplastic radiation therapy: Secondary | ICD-10-CM | POA: Diagnosis not present

## 2020-10-22 DIAGNOSIS — Z452 Encounter for adjustment and management of vascular access device: Secondary | ICD-10-CM | POA: Diagnosis not present

## 2020-10-22 DIAGNOSIS — C210 Malignant neoplasm of anus, unspecified: Secondary | ICD-10-CM | POA: Diagnosis not present

## 2020-10-23 DIAGNOSIS — Z95828 Presence of other vascular implants and grafts: Secondary | ICD-10-CM | POA: Diagnosis not present

## 2020-10-23 DIAGNOSIS — Z940 Kidney transplant status: Secondary | ICD-10-CM | POA: Diagnosis not present

## 2020-10-23 DIAGNOSIS — Z853 Personal history of malignant neoplasm of breast: Secondary | ICD-10-CM | POA: Diagnosis not present

## 2020-10-23 DIAGNOSIS — C210 Malignant neoplasm of anus, unspecified: Secondary | ICD-10-CM | POA: Diagnosis not present

## 2020-10-24 ENCOUNTER — Other Ambulatory Visit: Payer: Self-pay | Admitting: Family Medicine

## 2020-10-24 DIAGNOSIS — Z510 Encounter for antineoplastic radiation therapy: Secondary | ICD-10-CM | POA: Diagnosis not present

## 2020-10-24 DIAGNOSIS — Z853 Personal history of malignant neoplasm of breast: Secondary | ICD-10-CM | POA: Diagnosis not present

## 2020-10-24 DIAGNOSIS — C210 Malignant neoplasm of anus, unspecified: Secondary | ICD-10-CM | POA: Diagnosis not present

## 2020-10-24 DIAGNOSIS — Z940 Kidney transplant status: Secondary | ICD-10-CM | POA: Diagnosis not present

## 2020-10-24 DIAGNOSIS — R928 Other abnormal and inconclusive findings on diagnostic imaging of breast: Secondary | ICD-10-CM

## 2020-10-24 DIAGNOSIS — Z5111 Encounter for antineoplastic chemotherapy: Secondary | ICD-10-CM | POA: Diagnosis not present

## 2020-10-25 DIAGNOSIS — Z510 Encounter for antineoplastic radiation therapy: Secondary | ICD-10-CM | POA: Diagnosis not present

## 2020-10-25 DIAGNOSIS — Z940 Kidney transplant status: Secondary | ICD-10-CM | POA: Diagnosis not present

## 2020-10-25 DIAGNOSIS — C210 Malignant neoplasm of anus, unspecified: Secondary | ICD-10-CM | POA: Diagnosis not present

## 2020-10-25 DIAGNOSIS — Z853 Personal history of malignant neoplasm of breast: Secondary | ICD-10-CM | POA: Diagnosis not present

## 2020-10-26 DIAGNOSIS — C210 Malignant neoplasm of anus, unspecified: Secondary | ICD-10-CM | POA: Diagnosis not present

## 2020-10-26 DIAGNOSIS — Z940 Kidney transplant status: Secondary | ICD-10-CM | POA: Diagnosis not present

## 2020-10-26 DIAGNOSIS — Z510 Encounter for antineoplastic radiation therapy: Secondary | ICD-10-CM | POA: Diagnosis not present

## 2020-10-26 DIAGNOSIS — Z79899 Other long term (current) drug therapy: Secondary | ICD-10-CM | POA: Diagnosis not present

## 2020-10-26 DIAGNOSIS — Z5111 Encounter for antineoplastic chemotherapy: Secondary | ICD-10-CM | POA: Diagnosis not present

## 2020-10-26 DIAGNOSIS — Z853 Personal history of malignant neoplasm of breast: Secondary | ICD-10-CM | POA: Diagnosis not present

## 2020-10-29 DIAGNOSIS — Z510 Encounter for antineoplastic radiation therapy: Secondary | ICD-10-CM | POA: Diagnosis not present

## 2020-10-29 DIAGNOSIS — Z853 Personal history of malignant neoplasm of breast: Secondary | ICD-10-CM | POA: Diagnosis not present

## 2020-10-29 DIAGNOSIS — C210 Malignant neoplasm of anus, unspecified: Secondary | ICD-10-CM | POA: Diagnosis not present

## 2020-10-29 DIAGNOSIS — Z940 Kidney transplant status: Secondary | ICD-10-CM | POA: Diagnosis not present

## 2020-10-30 DIAGNOSIS — C210 Malignant neoplasm of anus, unspecified: Secondary | ICD-10-CM | POA: Diagnosis not present

## 2020-10-30 DIAGNOSIS — Z940 Kidney transplant status: Secondary | ICD-10-CM | POA: Diagnosis not present

## 2020-10-30 DIAGNOSIS — Z510 Encounter for antineoplastic radiation therapy: Secondary | ICD-10-CM | POA: Diagnosis not present

## 2020-10-30 DIAGNOSIS — Z853 Personal history of malignant neoplasm of breast: Secondary | ICD-10-CM | POA: Diagnosis not present

## 2020-10-31 DIAGNOSIS — Z853 Personal history of malignant neoplasm of breast: Secondary | ICD-10-CM | POA: Diagnosis not present

## 2020-10-31 DIAGNOSIS — C210 Malignant neoplasm of anus, unspecified: Secondary | ICD-10-CM | POA: Diagnosis not present

## 2020-10-31 DIAGNOSIS — Z940 Kidney transplant status: Secondary | ICD-10-CM | POA: Diagnosis not present

## 2020-10-31 DIAGNOSIS — Z510 Encounter for antineoplastic radiation therapy: Secondary | ICD-10-CM | POA: Diagnosis not present

## 2020-11-01 DIAGNOSIS — Z940 Kidney transplant status: Secondary | ICD-10-CM | POA: Diagnosis not present

## 2020-11-01 DIAGNOSIS — Z510 Encounter for antineoplastic radiation therapy: Secondary | ICD-10-CM | POA: Diagnosis not present

## 2020-11-01 DIAGNOSIS — Z853 Personal history of malignant neoplasm of breast: Secondary | ICD-10-CM | POA: Diagnosis not present

## 2020-11-01 DIAGNOSIS — C210 Malignant neoplasm of anus, unspecified: Secondary | ICD-10-CM | POA: Diagnosis not present

## 2020-11-02 DIAGNOSIS — C210 Malignant neoplasm of anus, unspecified: Secondary | ICD-10-CM | POA: Diagnosis not present

## 2020-11-02 DIAGNOSIS — Z510 Encounter for antineoplastic radiation therapy: Secondary | ICD-10-CM | POA: Diagnosis not present

## 2020-11-02 DIAGNOSIS — Z940 Kidney transplant status: Secondary | ICD-10-CM | POA: Diagnosis not present

## 2020-11-02 DIAGNOSIS — Z853 Personal history of malignant neoplasm of breast: Secondary | ICD-10-CM | POA: Diagnosis not present

## 2020-11-05 DIAGNOSIS — Z510 Encounter for antineoplastic radiation therapy: Secondary | ICD-10-CM | POA: Diagnosis not present

## 2020-11-05 DIAGNOSIS — C210 Malignant neoplasm of anus, unspecified: Secondary | ICD-10-CM | POA: Diagnosis not present

## 2020-11-05 DIAGNOSIS — Z940 Kidney transplant status: Secondary | ICD-10-CM | POA: Diagnosis not present

## 2020-11-05 DIAGNOSIS — Z853 Personal history of malignant neoplasm of breast: Secondary | ICD-10-CM | POA: Diagnosis not present

## 2020-11-06 DIAGNOSIS — C20 Malignant neoplasm of rectum: Secondary | ICD-10-CM | POA: Diagnosis not present

## 2020-11-06 DIAGNOSIS — C210 Malignant neoplasm of anus, unspecified: Secondary | ICD-10-CM | POA: Diagnosis not present

## 2020-11-06 DIAGNOSIS — Z452 Encounter for adjustment and management of vascular access device: Secondary | ICD-10-CM | POA: Diagnosis not present

## 2020-11-06 DIAGNOSIS — Z510 Encounter for antineoplastic radiation therapy: Secondary | ICD-10-CM | POA: Diagnosis not present

## 2020-11-06 DIAGNOSIS — Z853 Personal history of malignant neoplasm of breast: Secondary | ICD-10-CM | POA: Diagnosis not present

## 2020-11-06 DIAGNOSIS — Z940 Kidney transplant status: Secondary | ICD-10-CM | POA: Diagnosis not present

## 2020-11-07 DIAGNOSIS — Z510 Encounter for antineoplastic radiation therapy: Secondary | ICD-10-CM | POA: Diagnosis not present

## 2020-11-07 DIAGNOSIS — Z940 Kidney transplant status: Secondary | ICD-10-CM | POA: Diagnosis not present

## 2020-11-07 DIAGNOSIS — Z853 Personal history of malignant neoplasm of breast: Secondary | ICD-10-CM | POA: Diagnosis not present

## 2020-11-07 DIAGNOSIS — C210 Malignant neoplasm of anus, unspecified: Secondary | ICD-10-CM | POA: Diagnosis not present

## 2020-11-08 DIAGNOSIS — C210 Malignant neoplasm of anus, unspecified: Secondary | ICD-10-CM | POA: Diagnosis not present

## 2020-11-08 DIAGNOSIS — Z940 Kidney transplant status: Secondary | ICD-10-CM | POA: Diagnosis not present

## 2020-11-08 DIAGNOSIS — Z510 Encounter for antineoplastic radiation therapy: Secondary | ICD-10-CM | POA: Diagnosis not present

## 2020-11-08 DIAGNOSIS — Z853 Personal history of malignant neoplasm of breast: Secondary | ICD-10-CM | POA: Diagnosis not present

## 2020-11-09 DIAGNOSIS — C210 Malignant neoplasm of anus, unspecified: Secondary | ICD-10-CM | POA: Diagnosis not present

## 2020-11-09 DIAGNOSIS — Z940 Kidney transplant status: Secondary | ICD-10-CM | POA: Diagnosis not present

## 2020-11-09 DIAGNOSIS — Z853 Personal history of malignant neoplasm of breast: Secondary | ICD-10-CM | POA: Diagnosis not present

## 2020-11-09 DIAGNOSIS — Z510 Encounter for antineoplastic radiation therapy: Secondary | ICD-10-CM | POA: Diagnosis not present

## 2020-11-12 DIAGNOSIS — C210 Malignant neoplasm of anus, unspecified: Secondary | ICD-10-CM | POA: Diagnosis not present

## 2020-11-12 DIAGNOSIS — Z510 Encounter for antineoplastic radiation therapy: Secondary | ICD-10-CM | POA: Diagnosis not present

## 2020-11-12 DIAGNOSIS — Z940 Kidney transplant status: Secondary | ICD-10-CM | POA: Diagnosis not present

## 2020-11-12 DIAGNOSIS — Z853 Personal history of malignant neoplasm of breast: Secondary | ICD-10-CM | POA: Diagnosis not present

## 2020-11-13 DIAGNOSIS — Z940 Kidney transplant status: Secondary | ICD-10-CM | POA: Diagnosis not present

## 2020-11-13 DIAGNOSIS — Z853 Personal history of malignant neoplasm of breast: Secondary | ICD-10-CM | POA: Diagnosis not present

## 2020-11-13 DIAGNOSIS — Z510 Encounter for antineoplastic radiation therapy: Secondary | ICD-10-CM | POA: Diagnosis not present

## 2020-11-13 DIAGNOSIS — K1379 Other lesions of oral mucosa: Secondary | ICD-10-CM | POA: Diagnosis not present

## 2020-11-13 DIAGNOSIS — R5383 Other fatigue: Secondary | ICD-10-CM | POA: Diagnosis not present

## 2020-11-13 DIAGNOSIS — C210 Malignant neoplasm of anus, unspecified: Secondary | ICD-10-CM | POA: Diagnosis not present

## 2020-11-14 DIAGNOSIS — C210 Malignant neoplasm of anus, unspecified: Secondary | ICD-10-CM | POA: Diagnosis not present

## 2020-11-14 DIAGNOSIS — Z940 Kidney transplant status: Secondary | ICD-10-CM | POA: Diagnosis not present

## 2020-11-14 DIAGNOSIS — Z853 Personal history of malignant neoplasm of breast: Secondary | ICD-10-CM | POA: Diagnosis not present

## 2020-11-14 DIAGNOSIS — Z510 Encounter for antineoplastic radiation therapy: Secondary | ICD-10-CM | POA: Diagnosis not present

## 2020-11-15 DIAGNOSIS — Z940 Kidney transplant status: Secondary | ICD-10-CM | POA: Diagnosis not present

## 2020-11-15 DIAGNOSIS — C210 Malignant neoplasm of anus, unspecified: Secondary | ICD-10-CM | POA: Diagnosis not present

## 2020-11-15 DIAGNOSIS — Z510 Encounter for antineoplastic radiation therapy: Secondary | ICD-10-CM | POA: Diagnosis not present

## 2020-11-15 DIAGNOSIS — Z853 Personal history of malignant neoplasm of breast: Secondary | ICD-10-CM | POA: Diagnosis not present

## 2020-11-16 DIAGNOSIS — Z853 Personal history of malignant neoplasm of breast: Secondary | ICD-10-CM | POA: Diagnosis not present

## 2020-11-16 DIAGNOSIS — Z510 Encounter for antineoplastic radiation therapy: Secondary | ICD-10-CM | POA: Diagnosis not present

## 2020-11-16 DIAGNOSIS — C210 Malignant neoplasm of anus, unspecified: Secondary | ICD-10-CM | POA: Diagnosis not present

## 2020-11-16 DIAGNOSIS — Z940 Kidney transplant status: Secondary | ICD-10-CM | POA: Diagnosis not present

## 2020-11-19 DIAGNOSIS — Z853 Personal history of malignant neoplasm of breast: Secondary | ICD-10-CM | POA: Diagnosis not present

## 2020-11-19 DIAGNOSIS — Z510 Encounter for antineoplastic radiation therapy: Secondary | ICD-10-CM | POA: Diagnosis not present

## 2020-11-19 DIAGNOSIS — Z940 Kidney transplant status: Secondary | ICD-10-CM | POA: Diagnosis not present

## 2020-11-19 DIAGNOSIS — C210 Malignant neoplasm of anus, unspecified: Secondary | ICD-10-CM | POA: Diagnosis not present

## 2020-11-20 DIAGNOSIS — Z853 Personal history of malignant neoplasm of breast: Secondary | ICD-10-CM | POA: Diagnosis not present

## 2020-11-20 DIAGNOSIS — Z510 Encounter for antineoplastic radiation therapy: Secondary | ICD-10-CM | POA: Diagnosis not present

## 2020-11-20 DIAGNOSIS — C210 Malignant neoplasm of anus, unspecified: Secondary | ICD-10-CM | POA: Diagnosis not present

## 2020-11-20 DIAGNOSIS — Z940 Kidney transplant status: Secondary | ICD-10-CM | POA: Diagnosis not present

## 2020-11-21 DIAGNOSIS — Z853 Personal history of malignant neoplasm of breast: Secondary | ICD-10-CM | POA: Diagnosis not present

## 2020-11-21 DIAGNOSIS — Z5111 Encounter for antineoplastic chemotherapy: Secondary | ICD-10-CM | POA: Diagnosis not present

## 2020-11-21 DIAGNOSIS — Z510 Encounter for antineoplastic radiation therapy: Secondary | ICD-10-CM | POA: Diagnosis not present

## 2020-11-21 DIAGNOSIS — C210 Malignant neoplasm of anus, unspecified: Secondary | ICD-10-CM | POA: Diagnosis not present

## 2020-11-21 DIAGNOSIS — Z940 Kidney transplant status: Secondary | ICD-10-CM | POA: Diagnosis not present

## 2020-11-22 DIAGNOSIS — Z940 Kidney transplant status: Secondary | ICD-10-CM | POA: Diagnosis not present

## 2020-11-22 DIAGNOSIS — Z510 Encounter for antineoplastic radiation therapy: Secondary | ICD-10-CM | POA: Diagnosis not present

## 2020-11-22 DIAGNOSIS — Z853 Personal history of malignant neoplasm of breast: Secondary | ICD-10-CM | POA: Diagnosis not present

## 2020-11-22 DIAGNOSIS — C210 Malignant neoplasm of anus, unspecified: Secondary | ICD-10-CM | POA: Diagnosis not present

## 2020-11-23 DIAGNOSIS — Z853 Personal history of malignant neoplasm of breast: Secondary | ICD-10-CM | POA: Diagnosis not present

## 2020-11-23 DIAGNOSIS — Z451 Encounter for adjustment and management of infusion pump: Secondary | ICD-10-CM | POA: Diagnosis not present

## 2020-11-23 DIAGNOSIS — Z940 Kidney transplant status: Secondary | ICD-10-CM | POA: Diagnosis not present

## 2020-11-23 DIAGNOSIS — C210 Malignant neoplasm of anus, unspecified: Secondary | ICD-10-CM | POA: Diagnosis not present

## 2020-11-23 DIAGNOSIS — Z510 Encounter for antineoplastic radiation therapy: Secondary | ICD-10-CM | POA: Diagnosis not present

## 2020-11-26 DIAGNOSIS — Z853 Personal history of malignant neoplasm of breast: Secondary | ICD-10-CM | POA: Diagnosis not present

## 2020-11-26 DIAGNOSIS — Z940 Kidney transplant status: Secondary | ICD-10-CM | POA: Diagnosis not present

## 2020-11-26 DIAGNOSIS — C210 Malignant neoplasm of anus, unspecified: Secondary | ICD-10-CM | POA: Diagnosis not present

## 2020-11-26 DIAGNOSIS — Z510 Encounter for antineoplastic radiation therapy: Secondary | ICD-10-CM | POA: Diagnosis not present

## 2020-11-28 DIAGNOSIS — C210 Malignant neoplasm of anus, unspecified: Secondary | ICD-10-CM | POA: Diagnosis not present

## 2020-11-28 DIAGNOSIS — Z940 Kidney transplant status: Secondary | ICD-10-CM | POA: Diagnosis not present

## 2020-11-28 DIAGNOSIS — Z510 Encounter for antineoplastic radiation therapy: Secondary | ICD-10-CM | POA: Diagnosis not present

## 2020-11-28 DIAGNOSIS — R300 Dysuria: Secondary | ICD-10-CM | POA: Diagnosis not present

## 2020-11-28 DIAGNOSIS — Z853 Personal history of malignant neoplasm of breast: Secondary | ICD-10-CM | POA: Diagnosis not present

## 2020-11-29 DIAGNOSIS — C210 Malignant neoplasm of anus, unspecified: Secondary | ICD-10-CM | POA: Diagnosis not present

## 2020-11-29 DIAGNOSIS — Z853 Personal history of malignant neoplasm of breast: Secondary | ICD-10-CM | POA: Diagnosis not present

## 2020-11-29 DIAGNOSIS — Z510 Encounter for antineoplastic radiation therapy: Secondary | ICD-10-CM | POA: Diagnosis not present

## 2020-11-29 DIAGNOSIS — Z940 Kidney transplant status: Secondary | ICD-10-CM | POA: Diagnosis not present

## 2020-11-30 DIAGNOSIS — C210 Malignant neoplasm of anus, unspecified: Secondary | ICD-10-CM | POA: Diagnosis not present

## 2020-11-30 DIAGNOSIS — Z853 Personal history of malignant neoplasm of breast: Secondary | ICD-10-CM | POA: Diagnosis not present

## 2020-11-30 DIAGNOSIS — Z510 Encounter for antineoplastic radiation therapy: Secondary | ICD-10-CM | POA: Diagnosis not present

## 2020-11-30 DIAGNOSIS — Z940 Kidney transplant status: Secondary | ICD-10-CM | POA: Diagnosis not present

## 2020-12-03 DIAGNOSIS — Z940 Kidney transplant status: Secondary | ICD-10-CM | POA: Diagnosis not present

## 2020-12-03 DIAGNOSIS — Z510 Encounter for antineoplastic radiation therapy: Secondary | ICD-10-CM | POA: Diagnosis not present

## 2020-12-03 DIAGNOSIS — C210 Malignant neoplasm of anus, unspecified: Secondary | ICD-10-CM | POA: Diagnosis not present

## 2020-12-03 DIAGNOSIS — Z853 Personal history of malignant neoplasm of breast: Secondary | ICD-10-CM | POA: Diagnosis not present

## 2020-12-13 ENCOUNTER — Other Ambulatory Visit: Payer: BC Managed Care – PPO

## 2021-01-03 DIAGNOSIS — R110 Nausea: Secondary | ICD-10-CM | POA: Diagnosis not present

## 2021-01-03 DIAGNOSIS — Z923 Personal history of irradiation: Secondary | ICD-10-CM | POA: Diagnosis not present

## 2021-01-03 DIAGNOSIS — C210 Malignant neoplasm of anus, unspecified: Secondary | ICD-10-CM | POA: Diagnosis not present

## 2021-01-03 DIAGNOSIS — R52 Pain, unspecified: Secondary | ICD-10-CM | POA: Diagnosis not present

## 2021-01-03 DIAGNOSIS — Z9221 Personal history of antineoplastic chemotherapy: Secondary | ICD-10-CM | POA: Diagnosis not present

## 2021-01-11 ENCOUNTER — Ambulatory Visit: Payer: BC Managed Care – PPO

## 2021-01-11 ENCOUNTER — Ambulatory Visit
Admission: RE | Admit: 2021-01-11 | Discharge: 2021-01-11 | Disposition: A | Discharge status: Home / Self Care | Payer: BC Managed Care – PPO | Source: Ambulatory Visit | Attending: Family Medicine | Admitting: Family Medicine

## 2021-01-11 DIAGNOSIS — R928 Other abnormal and inconclusive findings on diagnostic imaging of breast: Secondary | ICD-10-CM

## 2021-01-11 DIAGNOSIS — R922 Inconclusive mammogram: Secondary | ICD-10-CM | POA: Diagnosis not present

## 2021-01-16 DIAGNOSIS — Z961 Presence of intraocular lens: Secondary | ICD-10-CM | POA: Diagnosis not present

## 2021-01-16 DIAGNOSIS — E119 Type 2 diabetes mellitus without complications: Secondary | ICD-10-CM | POA: Diagnosis not present

## 2021-01-24 DIAGNOSIS — N189 Chronic kidney disease, unspecified: Secondary | ICD-10-CM | POA: Diagnosis not present

## 2021-01-24 DIAGNOSIS — Z940 Kidney transplant status: Secondary | ICD-10-CM | POA: Diagnosis not present

## 2021-02-12 DIAGNOSIS — Z940 Kidney transplant status: Secondary | ICD-10-CM | POA: Diagnosis not present

## 2021-02-12 DIAGNOSIS — I10 Essential (primary) hypertension: Secondary | ICD-10-CM | POA: Diagnosis not present

## 2021-02-12 DIAGNOSIS — N2581 Secondary hyperparathyroidism of renal origin: Secondary | ICD-10-CM | POA: Diagnosis not present

## 2021-02-12 DIAGNOSIS — N189 Chronic kidney disease, unspecified: Secondary | ICD-10-CM | POA: Diagnosis not present

## 2021-02-14 DIAGNOSIS — B349 Viral infection, unspecified: Secondary | ICD-10-CM | POA: Diagnosis not present

## 2021-02-14 DIAGNOSIS — R051 Acute cough: Secondary | ICD-10-CM | POA: Diagnosis not present

## 2021-03-07 DIAGNOSIS — D638 Anemia in other chronic diseases classified elsewhere: Secondary | ICD-10-CM | POA: Diagnosis not present

## 2021-03-07 DIAGNOSIS — Z923 Personal history of irradiation: Secondary | ICD-10-CM | POA: Diagnosis not present

## 2021-03-07 DIAGNOSIS — D649 Anemia, unspecified: Secondary | ICD-10-CM | POA: Diagnosis not present

## 2021-03-07 DIAGNOSIS — Z9221 Personal history of antineoplastic chemotherapy: Secondary | ICD-10-CM | POA: Diagnosis not present

## 2021-03-07 DIAGNOSIS — E538 Deficiency of other specified B group vitamins: Secondary | ICD-10-CM | POA: Diagnosis not present

## 2021-03-07 DIAGNOSIS — C210 Malignant neoplasm of anus, unspecified: Secondary | ICD-10-CM | POA: Diagnosis not present

## 2021-03-20 DIAGNOSIS — C210 Malignant neoplasm of anus, unspecified: Secondary | ICD-10-CM | POA: Diagnosis not present

## 2021-04-12 DIAGNOSIS — E039 Hypothyroidism, unspecified: Secondary | ICD-10-CM | POA: Diagnosis not present

## 2021-04-12 DIAGNOSIS — E1122 Type 2 diabetes mellitus with diabetic chronic kidney disease: Secondary | ICD-10-CM | POA: Diagnosis not present

## 2021-04-12 DIAGNOSIS — N1832 Chronic kidney disease, stage 3b: Secondary | ICD-10-CM | POA: Diagnosis not present

## 2021-04-12 DIAGNOSIS — E1169 Type 2 diabetes mellitus with other specified complication: Secondary | ICD-10-CM | POA: Diagnosis not present

## 2021-04-12 DIAGNOSIS — E785 Hyperlipidemia, unspecified: Secondary | ICD-10-CM | POA: Diagnosis not present

## 2021-04-17 DIAGNOSIS — C210 Malignant neoplasm of anus, unspecified: Secondary | ICD-10-CM | POA: Diagnosis not present

## 2021-04-18 DIAGNOSIS — Z940 Kidney transplant status: Secondary | ICD-10-CM | POA: Diagnosis not present

## 2021-04-18 DIAGNOSIS — Z800 Family history of malignant neoplasm of digestive organs: Secondary | ICD-10-CM | POA: Diagnosis not present

## 2021-04-18 DIAGNOSIS — C210 Malignant neoplasm of anus, unspecified: Secondary | ICD-10-CM | POA: Diagnosis not present

## 2021-04-18 DIAGNOSIS — Z853 Personal history of malignant neoplasm of breast: Secondary | ICD-10-CM | POA: Diagnosis not present

## 2021-04-18 DIAGNOSIS — D649 Anemia, unspecified: Secondary | ICD-10-CM | POA: Diagnosis not present

## 2021-04-18 DIAGNOSIS — Z9221 Personal history of antineoplastic chemotherapy: Secondary | ICD-10-CM | POA: Diagnosis not present

## 2021-04-18 DIAGNOSIS — Z923 Personal history of irradiation: Secondary | ICD-10-CM | POA: Diagnosis not present

## 2021-04-22 DIAGNOSIS — Z940 Kidney transplant status: Secondary | ICD-10-CM | POA: Diagnosis not present

## 2021-04-22 DIAGNOSIS — E559 Vitamin D deficiency, unspecified: Secondary | ICD-10-CM | POA: Diagnosis not present

## 2021-04-22 DIAGNOSIS — N189 Chronic kidney disease, unspecified: Secondary | ICD-10-CM | POA: Diagnosis not present

## 2021-04-29 DIAGNOSIS — Z940 Kidney transplant status: Secondary | ICD-10-CM | POA: Diagnosis not present

## 2021-05-10 DIAGNOSIS — Z940 Kidney transplant status: Secondary | ICD-10-CM | POA: Diagnosis not present

## 2021-07-01 DIAGNOSIS — N189 Chronic kidney disease, unspecified: Secondary | ICD-10-CM | POA: Diagnosis not present

## 2021-07-01 DIAGNOSIS — B359 Dermatophytosis, unspecified: Secondary | ICD-10-CM | POA: Diagnosis not present

## 2021-07-01 DIAGNOSIS — Z940 Kidney transplant status: Secondary | ICD-10-CM | POA: Diagnosis not present

## 2021-07-03 DIAGNOSIS — Z85048 Personal history of other malignant neoplasm of rectum, rectosigmoid junction, and anus: Secondary | ICD-10-CM | POA: Diagnosis not present

## 2021-07-03 DIAGNOSIS — Z923 Personal history of irradiation: Secondary | ICD-10-CM | POA: Diagnosis not present

## 2021-07-03 DIAGNOSIS — Z853 Personal history of malignant neoplasm of breast: Secondary | ICD-10-CM | POA: Diagnosis not present

## 2021-07-31 DIAGNOSIS — C210 Malignant neoplasm of anus, unspecified: Secondary | ICD-10-CM | POA: Diagnosis not present

## 2021-07-31 DIAGNOSIS — C20 Malignant neoplasm of rectum: Secondary | ICD-10-CM | POA: Diagnosis not present

## 2021-07-31 DIAGNOSIS — D649 Anemia, unspecified: Secondary | ICD-10-CM | POA: Diagnosis not present

## 2021-07-31 DIAGNOSIS — E538 Deficiency of other specified B group vitamins: Secondary | ICD-10-CM | POA: Diagnosis not present

## 2021-08-13 DIAGNOSIS — Z940 Kidney transplant status: Secondary | ICD-10-CM | POA: Diagnosis not present

## 2021-08-23 DIAGNOSIS — Z940 Kidney transplant status: Secondary | ICD-10-CM | POA: Diagnosis not present

## 2021-08-23 DIAGNOSIS — I10 Essential (primary) hypertension: Secondary | ICD-10-CM | POA: Diagnosis not present

## 2021-08-23 DIAGNOSIS — N189 Chronic kidney disease, unspecified: Secondary | ICD-10-CM | POA: Diagnosis not present

## 2021-08-23 DIAGNOSIS — N2581 Secondary hyperparathyroidism of renal origin: Secondary | ICD-10-CM | POA: Diagnosis not present

## 2021-08-28 DIAGNOSIS — E041 Nontoxic single thyroid nodule: Secondary | ICD-10-CM | POA: Diagnosis not present

## 2021-08-28 DIAGNOSIS — C211 Malignant neoplasm of anal canal: Secondary | ICD-10-CM | POA: Diagnosis not present

## 2021-08-28 DIAGNOSIS — I7789 Other specified disorders of arteries and arterioles: Secondary | ICD-10-CM | POA: Diagnosis not present

## 2021-08-28 DIAGNOSIS — C210 Malignant neoplasm of anus, unspecified: Secondary | ICD-10-CM | POA: Diagnosis not present

## 2021-08-28 DIAGNOSIS — I700 Atherosclerosis of aorta: Secondary | ICD-10-CM | POA: Diagnosis not present

## 2021-09-17 DIAGNOSIS — Z940 Kidney transplant status: Secondary | ICD-10-CM | POA: Diagnosis not present

## 2021-09-17 DIAGNOSIS — Z85048 Personal history of other malignant neoplasm of rectum, rectosigmoid junction, and anus: Secondary | ICD-10-CM | POA: Diagnosis not present

## 2021-09-17 DIAGNOSIS — Z08 Encounter for follow-up examination after completed treatment for malignant neoplasm: Secondary | ICD-10-CM | POA: Diagnosis not present

## 2021-09-17 DIAGNOSIS — K648 Other hemorrhoids: Secondary | ICD-10-CM | POA: Diagnosis not present

## 2021-09-23 DIAGNOSIS — Z940 Kidney transplant status: Secondary | ICD-10-CM | POA: Diagnosis not present

## 2021-10-17 DIAGNOSIS — C210 Malignant neoplasm of anus, unspecified: Secondary | ICD-10-CM | POA: Diagnosis not present

## 2021-10-17 DIAGNOSIS — K6289 Other specified diseases of anus and rectum: Secondary | ICD-10-CM | POA: Diagnosis not present

## 2021-10-17 DIAGNOSIS — L83 Acanthosis nigricans: Secondary | ICD-10-CM | POA: Diagnosis not present

## 2021-10-17 DIAGNOSIS — Z85048 Personal history of other malignant neoplasm of rectum, rectosigmoid junction, and anus: Secondary | ICD-10-CM | POA: Diagnosis not present

## 2021-10-31 DIAGNOSIS — Z940 Kidney transplant status: Secondary | ICD-10-CM | POA: Diagnosis not present

## 2021-10-31 DIAGNOSIS — E559 Vitamin D deficiency, unspecified: Secondary | ICD-10-CM | POA: Diagnosis not present

## 2021-12-04 DIAGNOSIS — C20 Malignant neoplasm of rectum: Secondary | ICD-10-CM | POA: Diagnosis not present

## 2021-12-16 ENCOUNTER — Other Ambulatory Visit: Payer: Self-pay | Admitting: Family Medicine

## 2021-12-16 DIAGNOSIS — Z1231 Encounter for screening mammogram for malignant neoplasm of breast: Secondary | ICD-10-CM

## 2021-12-20 ENCOUNTER — Encounter (HOSPITAL_COMMUNITY): Payer: Self-pay

## 2021-12-20 ENCOUNTER — Ambulatory Visit (HOSPITAL_COMMUNITY)
Admission: EM | Admit: 2021-12-20 | Discharge: 2021-12-20 | Disposition: A | Discharge status: Home / Self Care | Payer: BC Managed Care – PPO | Attending: Family Medicine | Admitting: Family Medicine

## 2021-12-20 DIAGNOSIS — Z1152 Encounter for screening for COVID-19: Secondary | ICD-10-CM | POA: Diagnosis not present

## 2021-12-20 DIAGNOSIS — J069 Acute upper respiratory infection, unspecified: Secondary | ICD-10-CM | POA: Insufficient documentation

## 2021-12-20 DIAGNOSIS — J029 Acute pharyngitis, unspecified: Secondary | ICD-10-CM | POA: Diagnosis not present

## 2021-12-20 DIAGNOSIS — R0981 Nasal congestion: Secondary | ICD-10-CM | POA: Diagnosis not present

## 2021-12-20 DIAGNOSIS — R059 Cough, unspecified: Secondary | ICD-10-CM | POA: Insufficient documentation

## 2021-12-20 LAB — SARS CORONAVIRUS 2 (TAT 6-24 HRS): SARS Coronavirus 2: NEGATIVE

## 2021-12-20 MED ORDER — BENZONATATE 100 MG PO CAPS: 100.0000 mg | ORAL_CAPSULE | Freq: Three times a day (TID) | ORAL | 0 refills | Status: DC | PRN

## 2021-12-20 NOTE — Discharge Instructions (Signed)
  You have been swabbed for COVID, and the test will result in the next 24 hours. Our staff will call you if positive. If the COVID test is positive, you should quarantine for 5 days from the start of your symptoms  Take benzonatate 100 mg, 1 tab every 8 hours as needed for cough.

## 2021-12-20 NOTE — ED Triage Notes (Signed)
Chief Complaint: sore throat, stuffy nose.   Onset: Runny nose yesterday, sore throat today  OTC medications tried: Yes- 12 hr non drowsy cold pill     with little relief  Sick exposure: 17 month old nephew yesterday had a runny nose.   New foods or medications: No  Recent Travel: No

## 2021-12-20 NOTE — ED Provider Notes (Signed)
[facility]    CSN: [id number] Arrival date & time: 12/20/21  1147      History   Chief Complaint Chief Complaint  Patient presents with   Sore Throat   Nasal Congestion    HPI Vanessa [name] is a 61 y.o. female.    Sore Throat   Here for nasal congestion and rhinorrhea that began yesterday.  She did take a Claritin-D this morning.  Now she is having some sore throat and she did cough all night also.  No fever or chills and no nausea or vomiting or diarrhea. 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   Age 55 (1983)   Breast cancer ([place])    H/O kidney transplant 03/16/2013   History of left breast cancer 03/16/2013   Hx gestational diabetes 03/16/2013   Hx of primary hypertension 03/16/2013   Hypertension    Hypomagnesemia    Kidney transplant recipient    Personal history of radiation therapy     Patient Active Problem List   Diagnosis Date Noted   Infection due to Norovirus species 04/19/2019   Chronic diarrhea 04/15/2019   Hypomagnesemia 04/15/2019   Hypocalcemia 68/12/7515   Metabolic acidosis, NAG, bicarbonate losses 04/15/2019   Cellulitis 04/21/2015   Hypertension 04/21/2015   Hyponatremia 04/21/2015   Infected wound 04/21/2015   Breast cancer, left breast ([place]) 04/18/2014   Anemia in neoplastic disease 04/18/2014   History of left breast cancer 03/16/2013   Anemia in chronic renal disease 03/16/2013   H/O kidney transplant 03/16/2013   Brain aneurysm 03/16/2013   Hx of primary hypertension 03/16/2013   Hx gestational diabetes 03/16/2013    Past Surgical History:  Procedure Laterality Date   BREAST BIOPSY Right    BREAST LUMPECTOMY Left    2011   CEREBRAL ANEURYSM REPAIR     CESAREAN SECTION     KIDNEY TRANSPLANT      OB History   No obstetric history on file.      Home Medications    Prior to Admission medications   Medication Sig Start  Date End Date Taking? Authorizing Provider  benzonatate (TESSALON) 100 MG capsule Take 1 capsule (100 mg total) by mouth 3 (three) times daily as needed for cough. 12/20/21  Yes Barrett Henle, MD  acetaminophen (TYLENOL) 500 MG tablet Take 1,000 mg by mouth every 6 (six) hours as needed for mild pain, moderate pain or headache.     [provider]  amoxicillin (AMOXIL) 500 MG capsule Take 2,000 mg by mouth See admin instructions. Take 2,000 mg by mouth one hour prior to all dental cleanings and other procedures 05/22/15   [provider]  atenolol (TENORMIN) 25 MG tablet Take 25 mg by mouth daily.  06/20/15   [provider]  Biotin 5000 MCG TABS Take by mouth.    [provider]  calcitRIOL (ROCALTROL) 0.25 MCG capsule Take 0.25 mcg by mouth daily. 10/10/18   [provider]  colchicine 0.6 MG tablet Take 2 now and 1 in 1 hour.  May repeat dose once daily.  For gout attack. Patient not taking: Reported on 05/25/2019 02/08/12   Harden Mo, MD  CVS VITAMIN B12 1000 MCG tablet Take 2,000 mcg by mouth daily. 04/03/15   [provider]  fenofibrate micronized (ANTARA) 130 MG capsule Take 130 mg by mouth daily before breakfast.    [provider]  ferrous  sulfate 325 (65 FE) MG EC tablet Take 325 mg by mouth 3 (three) times daily with meals.    [provider]  hydrocortisone (ANUSOL-HC) 25 MG suppository Place 25 mg rectally 2 (two) times daily. am 03/30/19   [provider]  loperamide (IMODIUM A-D) 2 MG tablet Take 2 mg by mouth 3 (three) times daily as needed for diarrhea or loose stools.    [provider]  Multiple Vitamins-Minerals (ONE-A-DAY WOMENS 50+ ADVANTAGE PO) Take 1 tablet by mouth daily with breakfast.    [provider]  mycophenolate (CELLCEPT) 500 MG tablet Take by mouth 2 (two) times daily.    [provider]  Omega-3 1000 MG CAPS Take 1,000 mg by mouth daily with breakfast.     [provider]  ondansetron (ZOFRAN ODT) 4 MG disintegrating tablet Take 1 tablet (4 mg total) by mouth every 8 (eight) hours as needed for nausea or vomiting. Patient not taking: Reported on 05/25/2019 12/20/18   Corena Herter, PA-C  pantoprazole (PROTONIX) 40 MG tablet Take 40 mg by mouth daily before breakfast. 02/16/19   [provider]  potassium chloride SA (KLOR-CON) 20 MEQ tablet Take 20 mEq by mouth 2 (two) times daily.    [provider]  predniSONE (DELTASONE) 5 MG tablet Take 5 mg by mouth daily with breakfast.    [provider]  rifaximin (XIFAXAN) 550 MG TABS tablet Take 550 mg by mouth 2 (two) times daily.    [provider]  sodium bicarbonate 650 MG tablet Take 1,300 mg by mouth 3 (three) times daily.    [provider]  SYNTHROID 137 MCG tablet Take 137 mcg by mouth daily before breakfast.  01/25/13   [provider]  tacrolimus (PROGRAF) 1 MG capsule Take 1 mg by mouth in the morning and at bedtime.  02/27/13   [provider]  ULORIC 40 MG tablet Take 40 mg by mouth daily.  02/18/13   [provider]  Vitamin D, Ergocalciferol, (DRISDOL) 50000 units CAPS capsule Take 50,000 Units by mouth every Sunday.  03/29/15   [provider]  Zinc Oxide (DESITIN RAPID RELIEF EX) Apply 1 application topically See admin instructions. Apply externally to rectum after each loose bowel episode    [provider]    Family History Family History  Problem Relation Age of Onset   Brain cancer Mother    Diabetes type II Father    Parkinson's disease Father    Breast cancer Sister 69    Social History Social History   Tobacco Use   Smoking status: Former    Packs/day: 0.50    Years: 2.00    Total pack years: 1.00    Types: Cigarettes   Smokeless tobacco: Never  Vaping Use   Vaping Use: Never used  Substance Use Topics   Alcohol use: No   Drug use: No     Allergies    Codeine   Review of Systems Review of Systems   Physical Exam Triage Vital Signs ED Triage Vitals [12/20/21 1307]  Enc Vitals Group     BP (!) 180/86     Pulse Rate 70     Resp 16     Temp 98 F (36.7 C)     Temp Source Oral     SpO2 97 %     Weight      Height      Head Circumference      Peak Flow  Pain Score 6     Pain Loc      Pain Edu?      Excl. in [place]?    No data found.  Updated Vital Signs BP (!) 180/86 (BP Location: Right Wrist)   Pulse 70   Temp 98 F (36.7 C) (Oral)   Resp 16   LMP 01/16/2012   SpO2 97%   Visual Acuity Right Eye Distance:   Left Eye Distance:   Bilateral Distance:    Right Eye Near:   Left Eye Near:    Bilateral Near:     Physical Exam Vitals reviewed.  Constitutional:      General: She is not in acute distress.    Appearance: She is not toxic-appearing.  HENT:     Right Ear: Tympanic membrane and ear canal normal.     Left Ear: Tympanic membrane and ear canal normal.     Nose: Congestion present.     Mouth/Throat:     Mouth: Mucous membranes are moist.     Pharynx: No oropharyngeal exudate or posterior oropharyngeal erythema.  Eyes:     Extraocular Movements: Extraocular movements intact.     Conjunctiva/sclera: Conjunctivae normal.     Pupils: Pupils are equal, round, and reactive to light.  Cardiovascular:     Rate and Rhythm: Normal rate and regular rhythm.     Heart sounds: No murmur heard. Pulmonary:     Effort: Pulmonary effort is normal. No respiratory distress.     Breath sounds: No stridor. No wheezing, rhonchi or rales.  Musculoskeletal:     Cervical back: Neck supple.  Lymphadenopathy:     Cervical: No cervical adenopathy.  Skin:    Capillary Refill: Capillary refill takes less than 2 seconds.     Coloration: Skin is not jaundiced or pale.  Neurological:     General: No focal deficit present.     Mental Status: She is alert and oriented to person, place, and time.  Psychiatric:         Behavior: Behavior normal.      UC Treatments / Results  Labs (all labs ordered are listed, but only abnormal results are displayed) Labs Reviewed  SARS CORONAVIRUS 2 (TAT 6-24 HRS)    EKG   Radiology No results found.  Procedures Procedures (including critical care time)  Medications Ordered in UC Medications - No data to display  Initial Impression / Assessment and Plan / UC Course  I have reviewed the triage vital signs and the nursing notes.  Pertinent labs & imaging results that were available during my care of the patient were reviewed by me and considered in my medical decision making (see chart for details).        COVID swab is done, and if positive, she is a candidate for Paxlovid as her EGFR was over 60.  However, if she has any drug interactions with her current medications she may need to take molnupiravir instead.  Tessalon Perles are sent for the cough Final Clinical Impressions(s) / UC Diagnoses   Final diagnoses:  Viral URI with cough     Discharge Instructions       You have been swabbed for COVID, and the test will result in the next 24 hours. Our staff will call you if positive. If the COVID test is positive, you should quarantine for 5 days from the start of your symptoms  Take benzonatate 100 mg, 1 tab every 8 hours as needed for cough.  ED Prescriptions     Medication Sig Dispense Auth. Provider   benzonatate (TESSALON) 100 MG capsule Take 1 capsule (100 mg total) by mouth 3 (three) times daily as needed for cough. 21 capsule Barrett Henle, MD      PDMP not reviewed this encounter.   Barrett Henle, MD 12/20/21 [phone]

## 2021-12-23 NOTE — ED Notes (Signed)
Returned pt call and informed pt she will need to be revaluated to determined if pt needs a antibiotic.

## 2021-12-24 ENCOUNTER — Ambulatory Visit (HOSPITAL_COMMUNITY)
Admission: RE | Admit: 2021-12-24 | Discharge: 2021-12-24 | Disposition: A | Discharge status: Home / Self Care | Payer: BC Managed Care – PPO | Source: Ambulatory Visit | Attending: Physician Assistant | Admitting: Physician Assistant

## 2021-12-24 ENCOUNTER — Encounter (HOSPITAL_COMMUNITY): Payer: Self-pay

## 2021-12-24 VITALS — BP 124/73 | HR 83 | Temp 99.0°F | Resp 20

## 2021-12-24 DIAGNOSIS — E890 Postprocedural hypothyroidism: Secondary | ICD-10-CM | POA: Diagnosis not present

## 2021-12-24 DIAGNOSIS — E1122 Type 2 diabetes mellitus with diabetic chronic kidney disease: Secondary | ICD-10-CM | POA: Diagnosis not present

## 2021-12-24 DIAGNOSIS — J069 Acute upper respiratory infection, unspecified: Secondary | ICD-10-CM

## 2021-12-24 DIAGNOSIS — Z940 Kidney transplant status: Secondary | ICD-10-CM | POA: Diagnosis not present

## 2021-12-24 DIAGNOSIS — N1831 Chronic kidney disease, stage 3a: Secondary | ICD-10-CM | POA: Diagnosis not present

## 2021-12-24 MED ORDER — PROMETHAZINE-DM 6.25-15 MG/5ML PO SYRP: 5.0000 mL | ORAL_SOLUTION | Freq: Four times a day (QID) | ORAL | 0 refills | Status: DC | PRN

## 2021-12-24 MED ORDER — AMOXICILLIN-POT CLAVULANATE 875-125 MG PO TABS: 1.0000 | ORAL_TABLET | Freq: Two times a day (BID) | ORAL | 0 refills | Status: AC

## 2021-12-24 NOTE — ED Provider Notes (Signed)
[place]    CSN: [id number] Arrival date & time: 12/24/21  1608      History   Chief Complaint Chief Complaint  Patient presents with   Nasal Congestion    HPI Cloee Dunwoody Schorr is a 61 y.o. female.   PT seen about four days ago with similar sx.  Reports since that time she has had no improvement, sx have become worse.  She is requesting an antibiotic.  Reports cough is worse at night.  She is taking tessalon pearls with some relief.  Denies fever, chills, n/v/d, body aches, shortness of breath, wheezing.  COVID test negative.     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   Age 61 (1983)   Breast cancer ([place])    H/O kidney transplant 03/16/2013   History of left breast cancer 03/16/2013   Hx gestational diabetes 03/16/2013   Hx of primary hypertension 03/16/2013   Hypertension    Hypomagnesemia    Kidney transplant recipient    Personal history of radiation therapy     Patient Active Problem List   Diagnosis Date Noted   Infection due to Norovirus species 04/19/2019   Chronic diarrhea 04/15/2019   Hypomagnesemia 04/15/2019   Hypocalcemia 59/56/3875   Metabolic acidosis, NAG, bicarbonate losses 04/15/2019   Cellulitis 04/21/2015   Hypertension 04/21/2015   Hyponatremia 04/21/2015   Infected wound 04/21/2015   Breast cancer, left breast ([place]) 04/18/2014   Anemia in neoplastic disease 04/18/2014   History of left breast cancer 03/16/2013   Anemia in chronic renal disease 03/16/2013   H/O kidney transplant 03/16/2013   Brain aneurysm 03/16/2013   Hx of primary hypertension 03/16/2013   Hx gestational diabetes 03/16/2013    Past Surgical History:  Procedure Laterality Date   BREAST BIOPSY Right    BREAST LUMPECTOMY Left    2011   CEREBRAL ANEURYSM REPAIR     CESAREAN SECTION     KIDNEY TRANSPLANT      OB History   No obstetric history on file.      Home  Medications    Prior to Admission medications   Medication Sig Start Date End Date Taking? Authorizing Provider  amoxicillin-clavulanate (AUGMENTIN) 875-125 MG tablet Take 1 tablet by mouth 2 (two) times daily for 5 days. 12/24/21 12/29/21 Yes Ward, Lenise Arena, PA-C  promethazine-dextromethorphan (PROMETHAZINE-DM) 6.25-15 MG/5ML syrup Take 5 mLs by mouth 4 (four) times daily as needed for cough. 12/24/21  Yes Ward, Lenise Arena, PA-C  acetaminophen (TYLENOL) 500 MG tablet Take 1,000 mg by mouth every 6 (six) hours as needed for mild pain, moderate pain or headache.     [provider]  atenolol (TENORMIN) 25 MG tablet Take 25 mg by mouth daily.  06/20/15   [provider]  benzonatate (TESSALON) 100 MG capsule Take 1 capsule (100 mg total) by mouth 3 (three) times daily as needed for cough. 12/20/21   Barrett Henle, MD  Biotin 5000 MCG TABS Take by mouth.    [provider]  calcitRIOL (ROCALTROL) 0.25 MCG capsule Take 0.25 mcg by mouth daily. 10/10/18   [provider]  colchicine 0.6 MG tablet Take 2 now and 1 in 1 hour.  May repeat dose once daily.  For gout attack. Patient not taking: Reported on 05/25/2019 02/08/12   Harden Mo, MD  [employer] VITAMIN B12 1000 MCG tablet Take 2,000 mcg by mouth daily. 04/03/15  [provider]  fenofibrate micronized (ANTARA) 130 MG capsule Take 130 mg by mouth daily before breakfast.    [provider]  ferrous sulfate 325 (65 FE) MG EC tablet Take 325 mg by mouth 3 (three) times daily with meals.    [provider]  hydrocortisone (ANUSOL-HC) 25 MG suppository Place 25 mg rectally 2 (two) times daily. am 03/30/19   [provider]  loperamide (IMODIUM A-D) 2 MG tablet Take 2 mg by mouth 3 (three) times daily as needed for diarrhea or loose stools.    [provider]  Multiple Vitamins-Minerals (ONE-A-DAY WOMENS 50+ ADVANTAGE PO) Take 1 tablet by mouth daily with breakfast.    [provider]  mycophenolate (CELLCEPT) 500 MG tablet Take by mouth 2 (two) times daily.    [provider]  Omega-3 1000 MG CAPS Take 1,000 mg by mouth daily with breakfast.    [provider]  ondansetron (ZOFRAN ODT) 4 MG disintegrating tablet Take 1 tablet (4 mg total) by mouth every 8 (eight) hours as needed for nausea or vomiting. Patient not taking: Reported on 05/25/2019 12/20/18   Corena Herter, PA-C  pantoprazole (PROTONIX) 40 MG tablet Take 40 mg by mouth daily before breakfast. 02/16/19   [provider]  potassium chloride SA (KLOR-CON) 20 MEQ tablet Take 20 mEq by mouth 2 (two) times daily.    [provider]  predniSONE (DELTASONE) 5 MG tablet Take 5 mg by mouth daily with breakfast.    [provider]  rifaximin (XIFAXAN) 550 MG TABS tablet Take 550 mg by mouth 2 (two) times daily.    [provider]  sodium bicarbonate 650 MG tablet Take 1,300 mg by mouth 3 (three) times daily.    [provider]  SYNTHROID 137 MCG tablet Take 137 mcg by mouth daily before breakfast.  01/25/13   [provider]  tacrolimus (PROGRAF) 1 MG capsule Take 1 mg by mouth in the morning and at bedtime.  02/27/13   [provider]  ULORIC 40 MG tablet Take 40 mg by mouth daily.  02/18/13   [provider]  Vitamin D, Ergocalciferol, (DRISDOL) 50000 units CAPS capsule Take 50,000 Units by mouth every Sunday.  03/29/15   [provider]  Zinc Oxide (DESITIN RAPID RELIEF EX) Apply 1 application topically See admin instructions. Apply externally to rectum after each loose bowel episode    [provider]    Family History Family History  Problem Relation Age of Onset   Brain cancer Mother    Diabetes type II Father    Parkinson's disease Father    Breast cancer Sister 44    Social History Social History   Tobacco Use   Smoking status: Former    Packs/day: 0.50    Years: 2.00    Total pack  years: 1.00    Types: Cigarettes   Smokeless tobacco: Never  Vaping Use   Vaping Use: Never used  Substance Use Topics   Alcohol use: No   Drug use: No     Allergies   Codeine   Review of Systems Review of Systems  Constitutional:  Negative for chills and fever.  HENT:  Positive for congestion. Negative for ear pain and sore throat.   Eyes:  Negative for pain and visual disturbance.  Respiratory:  Positive for cough. Negative for shortness of breath.   Cardiovascular:  Negative for chest pain and palpitations.  Gastrointestinal:  Negative for abdominal pain  and vomiting.  Genitourinary:  Negative for dysuria and hematuria.  Musculoskeletal:  Negative for arthralgias and back pain.  Skin:  Negative for color change and rash.  Neurological:  Negative for seizures and syncope.  All other systems reviewed and are negative.    Physical Exam Triage Vital Signs ED Triage Vitals  Enc Vitals Group     BP 12/24/21 1651 124/73     Pulse Rate 12/24/21 1651 83     Resp 12/24/21 1651 20     Temp 12/24/21 1651 99 F (37.2 C)     Temp Source 12/24/21 1651 Oral     SpO2 12/24/21 1651 93 %     Weight --      Height --      Head Circumference --      Peak Flow --      Pain Score 12/24/21 1652 0     Pain Loc --      Pain Edu? --      Excl. in [place]? --    No data found.  Updated Vital Signs BP 124/73 (BP Location: Left Arm)   Pulse 83   Temp 99 F (37.2 C) (Oral)   Resp 20   LMP 01/16/2012   SpO2 93%   Visual Acuity Right Eye Distance:   Left Eye Distance:   Bilateral Distance:    Right Eye Near:   Left Eye Near:    Bilateral Near:     Physical Exam Vitals and nursing note reviewed.  Constitutional:      General: She is not in acute distress.    Appearance: She is well-developed.  HENT:     Head: Normocephalic and atraumatic.  Eyes:     Conjunctiva/sclera: Conjunctivae normal.  Cardiovascular:     Rate and Rhythm: Normal rate and regular rhythm.     Heart  sounds: No murmur heard. Pulmonary:     Effort: Pulmonary effort is normal. No respiratory distress.     Breath sounds: Normal breath sounds.  Abdominal:     Palpations: Abdomen is soft.     Tenderness: There is no abdominal tenderness.  Musculoskeletal:        General: No swelling.     Cervical back: Neck supple.  Skin:    General: Skin is warm and dry.     Capillary Refill: Capillary refill takes less than 2 seconds.  Neurological:     Mental Status: She is alert.  Psychiatric:        Mood and Affect: Mood normal.      UC Treatments / Results  Labs (all labs ordered are listed, but only abnormal results are displayed) Labs Reviewed - No data to display  EKG   Radiology No results found.  Procedures Procedures (including critical care time)  Medications Ordered in UC Medications - No data to display  Initial Impression / Assessment and Plan / UC Course  I have reviewed the triage vital signs and the nursing notes.  Pertinent labs & imaging results that were available during my care of the patient were reviewed by me and considered in my medical decision making (see chart for details).     Viral URI.  Pt overall well appearing, in no acute distress, lungs clear, vitals normal.  Advised continued supportive care.  Pt requesting antibiotics.  Will prescribe, but discussed holding at this time as sx more viral in nature.  Return precautions discussed.  Final Clinical Impressions(s) / UC Diagnoses   Final diagnoses:  Viral upper respiratory tract infection     Discharge Instructions      Recommend cough syrup as needed Can add Mucinex and Flonase Continue Tessalon as needed for cough Drink plenty of fluids Will send in an antibiotic, recommend holding for a few more days before starting as your symptoms are viral in nature.    ED Prescriptions     Medication Sig Dispense Auth. Provider   amoxicillin-clavulanate (AUGMENTIN) 875-125 MG tablet Take 1 tablet  by mouth 2 (two) times daily for 5 days. 10 tablet Ward, Lenise Arena, PA-C   promethazine-dextromethorphan (PROMETHAZINE-DM) 6.25-15 MG/5ML syrup Take 5 mLs by mouth 4 (four) times daily as needed for cough. 118 mL Ward, Lenise Arena, PA-C      PDMP not reviewed this encounter.   Ward, Lenise Arena, PA-C 12/24/21 1735

## 2021-12-24 NOTE — Discharge Instructions (Addendum)
Recommend cough syrup as needed Can add Mucinex and Flonase Continue Tessalon as needed for cough Drink plenty of fluids Will send in an antibiotic, recommend holding for a few more days before starting as your symptoms are viral in nature.

## 2021-12-24 NOTE — ED Triage Notes (Signed)
sore throat, stuffy nose. Symptoms are worsening since 4 days ago. Wants antibiotic to clear symptoms

## 2022-02-03 DIAGNOSIS — Z940 Kidney transplant status: Secondary | ICD-10-CM | POA: Diagnosis not present

## 2022-02-05 DIAGNOSIS — Z923 Personal history of irradiation: Secondary | ICD-10-CM | POA: Diagnosis not present

## 2022-02-05 DIAGNOSIS — C210 Malignant neoplasm of anus, unspecified: Secondary | ICD-10-CM | POA: Diagnosis not present

## 2022-02-05 DIAGNOSIS — D649 Anemia, unspecified: Secondary | ICD-10-CM | POA: Diagnosis not present

## 2022-02-05 DIAGNOSIS — Z9221 Personal history of antineoplastic chemotherapy: Secondary | ICD-10-CM | POA: Diagnosis not present

## 2022-02-05 DIAGNOSIS — Z79899 Other long term (current) drug therapy: Secondary | ICD-10-CM | POA: Diagnosis not present

## 2022-02-05 DIAGNOSIS — D638 Anemia in other chronic diseases classified elsewhere: Secondary | ICD-10-CM | POA: Diagnosis not present

## 2022-02-06 DIAGNOSIS — N189 Chronic kidney disease, unspecified: Secondary | ICD-10-CM | POA: Diagnosis not present

## 2022-02-06 DIAGNOSIS — Z940 Kidney transplant status: Secondary | ICD-10-CM | POA: Diagnosis not present

## 2022-02-06 DIAGNOSIS — I10 Essential (primary) hypertension: Secondary | ICD-10-CM | POA: Diagnosis not present

## 2022-02-06 DIAGNOSIS — N2581 Secondary hyperparathyroidism of renal origin: Secondary | ICD-10-CM | POA: Diagnosis not present

## 2022-02-13 ENCOUNTER — Ambulatory Visit
Admission: RE | Admit: 2022-02-13 | Discharge: 2022-02-13 | Disposition: A | Discharge status: Home / Self Care | Payer: BC Managed Care – PPO | Source: Ambulatory Visit | Attending: Family Medicine | Admitting: Family Medicine

## 2022-02-13 DIAGNOSIS — Z1231 Encounter for screening mammogram for malignant neoplasm of breast: Secondary | ICD-10-CM

## 2022-02-26 DIAGNOSIS — Z940 Kidney transplant status: Secondary | ICD-10-CM | POA: Diagnosis not present

## 2022-03-06 ENCOUNTER — Other Ambulatory Visit: Payer: Self-pay

## 2022-03-06 ENCOUNTER — Encounter (HOSPITAL_COMMUNITY): Payer: Self-pay | Admitting: Emergency Medicine

## 2022-03-06 ENCOUNTER — Ambulatory Visit (HOSPITAL_COMMUNITY)
Admission: EM | Admit: 2022-03-06 | Discharge: 2022-03-06 | Disposition: A | Discharge status: Home / Self Care | Payer: BC Managed Care – PPO | Attending: Sports Medicine | Admitting: Sports Medicine

## 2022-03-06 ENCOUNTER — Ambulatory Visit (INDEPENDENT_AMBULATORY_CARE_PROVIDER_SITE_OTHER): Payer: BC Managed Care – PPO

## 2022-03-06 DIAGNOSIS — S86002A Unspecified injury of left Achilles tendon, initial encounter: Secondary | ICD-10-CM

## 2022-03-06 DIAGNOSIS — M25572 Pain in left ankle and joints of left foot: Secondary | ICD-10-CM | POA: Diagnosis not present

## 2022-03-06 MED ORDER — TRAMADOL HCL 50 MG PO TABS: 50.0000 mg | ORAL_TABLET | Freq: Four times a day (QID) | ORAL | 0 refills | Status: DC | PRN

## 2022-03-06 NOTE — Discharge Instructions (Signed)
Your x-rays showed concern for possible small avulsion injury at the site of your Achilles tendon.  Continue to ambulate in the walking boot we gave you today, ice and elevate the ankle.  Follow-up at [place].

## 2022-03-06 NOTE — ED Provider Notes (Signed)
[name]    CSN: [id number] Arrival date & time: 03/06/22  1124      History   Chief Complaint Chief Complaint  Patient presents with   Ankle Pain    HPI Vanessa [name] is a 62 y.o. female.   She is here today with chief complaint of left posterior heel pain after she had a twisting injury of her ankle last night when she tripped over a rug.  She reports she had immediate pain causing her to sit down and she was unable to ambulate at that time.  Her pain has only slightly resolved.  She did take some ibuprofen last night however she only has 1 kidney so cannot continue with that medication at this time.  She denies any distinct pop.  She has noticed some swelling and bruising.  She denies any previous injuries to this ankle before.   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   Age 1 (1983)   Breast cancer ([place])    H/O kidney transplant 03/16/2013   History of left breast cancer 03/16/2013   Hx gestational diabetes 03/16/2013   Hx of primary hypertension 03/16/2013   Hypertension    Hypomagnesemia    Kidney transplant recipient    Personal history of radiation therapy     Patient Active Problem List   Diagnosis Date Noted   Infection due to Norovirus species 04/19/2019   Chronic diarrhea 04/15/2019   Hypomagnesemia 04/15/2019   Hypocalcemia 03/47/4259   Metabolic acidosis, NAG, bicarbonate losses 04/15/2019   Cellulitis 04/21/2015   Hypertension 04/21/2015   Hyponatremia 04/21/2015   Infected wound 04/21/2015   Breast cancer, left breast ([place]) 04/18/2014   Anemia in neoplastic disease 04/18/2014   History of left breast cancer 03/16/2013   Anemia in chronic renal disease 03/16/2013   H/O kidney transplant 03/16/2013   Brain aneurysm 03/16/2013   Hx of primary hypertension 03/16/2013   Hx gestational diabetes 03/16/2013    Past Surgical History:   Procedure Laterality Date   BREAST BIOPSY Right    BREAST LUMPECTOMY Left    2011   CEREBRAL ANEURYSM REPAIR     CESAREAN SECTION     KIDNEY TRANSPLANT      OB History   No obstetric history on file.      Home Medications    Prior to Admission medications   Medication Sig Start Date End Date Taking? Authorizing Provider  acetaminophen (TYLENOL) 500 MG tablet Take 1,000 mg by mouth every 6 (six) hours as needed for mild pain, moderate pain or headache.     [provider]  atenolol (TENORMIN) 25 MG tablet Take 25 mg by mouth daily.  06/20/15   [provider]  Biotin 5000 MCG TABS Take by mouth.    [provider]  calcitRIOL (ROCALTROL) 0.25 MCG capsule Take 0.25 mcg by mouth daily. 10/10/18   [provider]  [employer]S VITAMIN B12 1000 MCG tablet Take 2,000 mcg by mouth daily. 04/03/15   [provider]  fenofibrate micronized (ANTARA) 130 MG capsule Take 130 mg by mouth daily before breakfast.    [provider]  ferrous sulfate 325 (65 FE) MG EC tablet Take 325 mg by mouth 3 (three) times daily with meals.    [provider]  hydrocortisone (ANUSOL-HC) 25 MG suppository Place 25 mg rectally 2 (two) times daily. am 03/30/19   [provider]  loperamide (IMODIUM A-D) 2 MG tablet Take 2 mg by mouth 3 (three) times daily as needed for diarrhea or loose stools.    [provider]  Multiple Vitamins-Minerals (ONE-A-DAY WOMENS 50+ ADVANTAGE PO) Take 1 tablet by mouth daily with breakfast.    [provider]  mycophenolate (CELLCEPT) 500 MG tablet Take by mouth 2 (two) times daily.    [provider]  Omega-3 1000 MG CAPS Take 1,000 mg by mouth daily with breakfast.    [provider]  pantoprazole (PROTONIX) 40 MG tablet Take 40 mg by mouth daily before breakfast. 02/16/19   [provider]  potassium chloride SA (KLOR-CON) 20 MEQ tablet Take 20 mEq by mouth 2 (two) times daily.     [provider]  predniSONE (DELTASONE) 5 MG tablet Take 5 mg by mouth daily with breakfast.    [provider]  promethazine-dextromethorphan (PROMETHAZINE-DM) 6.25-15 MG/5ML syrup Take 5 mLs by mouth 4 (four) times daily as needed for cough. 12/24/21   Ward, Lenise Arena, PA-C  sodium bicarbonate 650 MG tablet Take 1,300 mg by mouth 3 (three) times daily.    [provider]  SYNTHROID 137 MCG tablet Take 137 mcg by mouth daily before breakfast.  01/25/13   [provider]  tacrolimus (PROGRAF) 1 MG capsule Take 1 mg by mouth in the morning and at bedtime.  02/27/13   [provider]  traMADol (ULTRAM) 50 MG tablet Take 1 tablet (50 mg total) by mouth every 6 (six) hours as needed. 03/06/22  Yes Minahil Quinlivan A, DO  ULORIC 40 MG tablet Take 40 mg by mouth daily.  02/18/13   [provider]  Vitamin D, Ergocalciferol, (DRISDOL) 50000 units CAPS capsule Take 50,000 Units by mouth every Sunday.  03/29/15   [provider]  Zinc Oxide (DESITIN RAPID RELIEF EX) Apply 1 application topically See admin instructions. Apply externally to rectum after each loose bowel episode    [provider]    Family History Family History  Problem Relation Age of Onset   Brain cancer Mother    Diabetes type II Father    Parkinson's disease Father    Breast cancer Sister 57    Social History Social History   Tobacco Use   Smoking status: Former    Packs/day: 0.50    Years: 2.00    Total pack years: 1.00    Types: Cigarettes   Smokeless tobacco: Never  Vaping Use   Vaping Use: Never used  Substance Use Topics   Alcohol use: No   Drug use: No     Allergies   Codeine   Review of Systems Review of Systems As listed above in HPI  Physical Exam Triage Vital Signs ED Triage Vitals  Enc Vitals Group     BP 03/06/22 1233 (!) 145/76     Pulse Rate 03/06/22 1233 71     Resp 03/06/22 1233 18     Temp 03/06/22 1233 97.7 F (36.5 C)      Temp Source 03/06/22 1233 Oral     SpO2 03/06/22 1233 98 %     Weight --      Height --      Head Circumference --      Peak Flow --      Pain Score 03/06/22 1230 8     Pain Loc --      Pain Edu? --      Excl. in [place]? --  No data found.  Updated Vital Signs BP (!) 145/76 (BP Location: Right Arm)   Pulse 71   Temp 97.7 F (36.5 C) (Oral)   Resp 18   LMP 01/16/2012   SpO2 98%   Physical Exam Vitals reviewed.  Constitutional:      General: She is not in acute distress.    Appearance: Normal appearance. She is not ill-appearing, toxic-appearing or diaphoretic.  Cardiovascular:     Rate and Rhythm: Normal rate.  Pulmonary:     Effort: Pulmonary effort is normal.  Neurological:     Mental Status: She is alert.   Left ankle: No obvious deformity or asymmetry.  She does have some swelling in the posterior aspect of her calcaneus.  Very tender to palpation at the origin of the Achilles tendon.  Decreased range of motion with dorsiflexion secondary to pain.  Full range of motion with plantarflexion.  Dorsalis pedis 2+.  Plantarflexion visualized on [place] testing.   UC Treatments / Results  Labs (all labs ordered are listed, but only abnormal results are displayed) Labs Reviewed - No data to display  EKG   Radiology DG Os Calcis Left  Result Date: 03/06/2022 CLINICAL DATA:  Left ankle and heel pain EXAM: LEFT OS CALCIS - 2+ VIEW; LEFT ANKLE - 2 VIEW COMPARISON:  None Available. FINDINGS: Multiple foci of calcification/mineralization posterior to the calcaneus at the Achilles tendon insertion. Soft tissue thickening along the course of the expected distal Achilles tendon. Otherwise, no acute fracture or dislocation. Ankle mortise is congruent. Joint spaces of the ankle and hindfoot are maintained. IMPRESSION: 1. Multiple foci of calcification/mineralization posterior to the calcaneus at the Achilles tendon insertion. Findings may represent calcific tendinopathy although  bony avulsive changes related to Achilles tendon injury may have this appearance. Correlate with physical exam and consider sonographic evaluation if there is concern for Achilles tear. 2. Otherwise, no acute fracture or dislocation. Electronically Signed   By: Davina Poke D.O.   On: 03/06/2022 13:19   DG Ankle 2 Views Left  Result Date: 03/06/2022 CLINICAL DATA:  Left ankle and heel pain EXAM: LEFT OS CALCIS - 2+ VIEW; LEFT ANKLE - 2 VIEW COMPARISON:  None Available. FINDINGS: Multiple foci of calcification/mineralization posterior to the calcaneus at the Achilles tendon insertion. Soft tissue thickening along the course of the expected distal Achilles tendon. Otherwise, no acute fracture or dislocation. Ankle mortise is congruent. Joint spaces of the ankle and hindfoot are maintained. IMPRESSION: 1. Multiple foci of calcification/mineralization posterior to the calcaneus at the Achilles tendon insertion. Findings may represent calcific tendinopathy although bony avulsive changes related to Achilles tendon injury may have this appearance. Correlate with physical exam and consider sonographic evaluation if there is concern for Achilles tear. 2. Otherwise, no acute fracture or dislocation. Electronically Signed   By: Davina Poke D.O.   On: 03/06/2022 13:19    Procedures Procedures (including critical care time)  Medications Ordered in UC Medications - No data to display  Initial Impression / Assessment and Plan / UC Course  I have reviewed the triage vital signs and the nursing notes.  Pertinent labs & imaging results that were available during my care of the patient were reviewed by me and considered in my medical decision making (see chart for details).     Injury to the left Achilles, x-ray 2 view calcaneus 2 view ankle, findings likely representative of a bony avulsion to the Achilles tendon.  Patient had acute injury last night with swelling  tenderness to palpation and difficulty with  range of motion.  She was placed in a tall cam walker boot for ambulation.  Recommend she ice and elevate and follow-up with orthopedics next week.  Patient is unable to take ibuprofen secondary to her other chronic medical conditions.  I have sent to her pharmacy a very short supply of tramadol.  She states she has had tramadol in the past and done well with this medication.  I provided a work note with standing and walking restrictions. Final Clinical Impressions(s) / UC Diagnoses   Final diagnoses:  Injury of left Achilles tendon, initial encounter     Discharge Instructions      Your x-rays showed concern for possible small avulsion injury at the site of your Achilles tendon.  Continue to ambulate in the walking boot we gave you today, ice and elevate the ankle.  Follow-up at [facility].     ED Prescriptions     Medication Sig Dispense Auth. Provider   traMADol (ULTRAM) 50 MG tablet Take 1 tablet (50 mg total) by mouth every 6 (six) hours as needed. 15 tablet Rodena Goldmann A, DO      I have reviewed the PDMP during this encounter.   Rodena Goldmann A, DO 03/06/22 1344

## 2022-03-06 NOTE — ED Triage Notes (Signed)
Lat night was carrying groceries and tripped on a floor mat.  Felt left ankle roll.  Patient did not fall, but rolled ankle and has had pain.  Patient took ibuprofen, applied a brace she had at home.  Pain is in the back of ankle and heel.  Not on either side, directly in back

## 2022-03-10 DIAGNOSIS — S86012A Strain of left Achilles tendon, initial encounter: Secondary | ICD-10-CM | POA: Diagnosis not present

## 2022-03-18 ENCOUNTER — Other Ambulatory Visit (HOSPITAL_COMMUNITY): Payer: Self-pay | Admitting: Orthopaedic Surgery

## 2022-03-18 DIAGNOSIS — M25572 Pain in left ankle and joints of left foot: Secondary | ICD-10-CM

## 2022-03-20 DIAGNOSIS — Z524 Kidney donor: Secondary | ICD-10-CM | POA: Diagnosis not present

## 2022-03-25 ENCOUNTER — Ambulatory Visit (HOSPITAL_COMMUNITY)
Admission: RE | Admit: 2022-03-25 | Discharge: 2022-03-25 | Disposition: A | Discharge status: Home / Self Care | Payer: BC Managed Care – PPO | Source: Ambulatory Visit | Attending: Orthopaedic Surgery | Admitting: Orthopaedic Surgery

## 2022-03-25 ENCOUNTER — Encounter (HOSPITAL_COMMUNITY): Payer: Self-pay

## 2022-03-25 ENCOUNTER — Other Ambulatory Visit (HOSPITAL_COMMUNITY): Payer: Self-pay | Admitting: Orthopaedic Surgery

## 2022-03-25 DIAGNOSIS — M25572 Pain in left ankle and joints of left foot: Secondary | ICD-10-CM

## 2022-03-25 DIAGNOSIS — Z9889 Other specified postprocedural states: Secondary | ICD-10-CM | POA: Insufficient documentation

## 2022-03-25 DIAGNOSIS — Z8679 Personal history of other diseases of the circulatory system: Secondary | ICD-10-CM

## 2022-03-25 DIAGNOSIS — I671 Cerebral aneurysm, nonruptured: Secondary | ICD-10-CM | POA: Diagnosis not present

## 2022-03-25 NOTE — Progress Notes (Signed)
Pt arrived for MRI appt. Pt wrote "aneurysm" as part of her surgical history. When questioned, pt stated she had an aneurysm repair in 1981 "but they just tied it off they didn't put any clip in it." Pt has no imaging history of brain or skull, no device card to research and no op report could be found. Skull X-rays were ordered today and there is a metallic clip in place and Radiologist deemed it UNSAFE to proceed with MRI. Pt made aware and stated she understood that she would not be able to have this or any future MRIs done within the [facility] system.

## 2022-03-28 DIAGNOSIS — S86012D Strain of left Achilles tendon, subsequent encounter: Secondary | ICD-10-CM | POA: Diagnosis not present

## 2022-03-31 DIAGNOSIS — E119 Type 2 diabetes mellitus without complications: Secondary | ICD-10-CM | POA: Diagnosis not present

## 2022-03-31 DIAGNOSIS — Z961 Presence of intraocular lens: Secondary | ICD-10-CM | POA: Diagnosis not present

## 2022-03-31 DIAGNOSIS — H04123 Dry eye syndrome of bilateral lacrimal glands: Secondary | ICD-10-CM | POA: Diagnosis not present

## 2022-04-18 DIAGNOSIS — S86012D Strain of left Achilles tendon, subsequent encounter: Secondary | ICD-10-CM | POA: Diagnosis not present

## 2022-04-26 ENCOUNTER — Ambulatory Visit (HOSPITAL_COMMUNITY)
Admission: RE | Admit: 2022-04-26 | Discharge: 2022-04-26 | Disposition: A | Discharge status: Home / Self Care | Payer: BC Managed Care – PPO | Source: Ambulatory Visit | Attending: Family Medicine | Admitting: Family Medicine

## 2022-04-26 ENCOUNTER — Encounter (HOSPITAL_COMMUNITY): Payer: Self-pay

## 2022-04-26 VITALS — BP 140/83 | HR 68 | Temp 98.4°F | Resp 18 | Ht 62.0 in | Wt 127.0 lb

## 2022-04-26 DIAGNOSIS — J029 Acute pharyngitis, unspecified: Secondary | ICD-10-CM

## 2022-04-26 DIAGNOSIS — J309 Allergic rhinitis, unspecified: Secondary | ICD-10-CM

## 2022-04-26 DIAGNOSIS — R059 Cough, unspecified: Secondary | ICD-10-CM

## 2022-04-26 MED ORDER — PROMETHAZINE-DM 6.25-15 MG/5ML PO SYRP: 5.0000 mL | ORAL_SOLUTION | Freq: Four times a day (QID) | ORAL | 0 refills | Status: DC | PRN

## 2022-04-26 MED ORDER — FEXOFENADINE HCL 180 MG PO TABS: 180.0000 mg | ORAL_TABLET | Freq: Every day | ORAL | 0 refills | Status: AC

## 2022-04-26 MED ORDER — AZITHROMYCIN 250 MG PO TABS: 250.0000 mg | ORAL_TABLET | Freq: Every day | ORAL | 0 refills | Status: AC

## 2022-04-26 NOTE — ED Triage Notes (Signed)
Onset Thursday night with scratchy throaty, last night onset of cough, drainage, and sore throat. No known sick exposure.  Patient has not tried any meds, only peppermint.

## 2022-04-26 NOTE — ED Provider Notes (Signed)
Vanessa [name]    CSN: [id number] Arrival date & time: 04/26/22  1403      History   Chief Complaint Chief Complaint  Patient presents with   Appointment    Sore throat , bad cough all night! - Entered by patient    HPI Vanessa [name] is a 62 y.o. female.   HPI Very pleasant 62 year old female presents with sore throat and bad cough that began 2 nights ago.  PMH significant for breast cancer, brain aneurysm, and HTN.  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   Age 37 (1983)   Breast cancer ([place])    H/O kidney transplant 03/16/2013   History of left breast cancer 03/16/2013   Hx gestational diabetes 03/16/2013   Hx of primary hypertension 03/16/2013   Hypertension    Hypomagnesemia    Kidney transplant recipient    Personal history of radiation therapy     Patient Active Problem List   Diagnosis Date Noted   Infection due to Norovirus species 04/19/2019   Chronic diarrhea 04/15/2019   Hypomagnesemia 04/15/2019   Hypocalcemia [id number]   Metabolic acidosis, NAG, bicarbonate losses 04/15/2019   Cellulitis 04/21/2015   Hypertension 04/21/2015   Hyponatremia 04/21/2015   Infected wound 04/21/2015   Breast cancer, left breast ([place]) 04/18/2014   Anemia in neoplastic disease 04/18/2014   History of left breast cancer 03/16/2013   Anemia in chronic renal disease 03/16/2013   H/O kidney transplant 03/16/2013   Brain aneurysm 03/16/2013   Hx of primary hypertension 03/16/2013   Hx gestational diabetes 03/16/2013    Past Surgical History:  Procedure Laterality Date   BREAST BIOPSY Right    BREAST LUMPECTOMY Left    2011   CEREBRAL ANEURYSM REPAIR  01/28/1979   CLIP IN PLACE AND UNSAFE FOR MRI PER RADIOLOGIST 03/25/22   CESAREAN SECTION     KIDNEY TRANSPLANT      OB History   No obstetric history on file.      Home Medications    Prior to Admission medications    Medication Sig Start Date End Date Taking? Authorizing Provider  atenolol (TENORMIN) 25 MG tablet Take 25 mg by mouth daily.  06/20/15  Yes [provider]  azithromycin (ZITHROMAX) 250 MG tablet Take 1 tablet (250 mg total) by mouth daily. Take first 2 tablets together, then 1 every day until finished. 04/26/22  Yes Eliezer Lofts, FNP  Biotin 5000 MCG TABS Take by mouth.   Yes [provider]  calcitRIOL (ROCALTROL) 0.25 MCG capsule Take 0.25 mcg by mouth daily. 10/10/18  Yes [provider]  [employer] VITAMIN B12 1000 MCG tablet Take 2,000 mcg by mouth daily. 04/03/15  Yes [provider]  fenofibrate micronized (ANTARA) 130 MG capsule Take 130 mg by mouth daily before breakfast.   Yes [provider]  ferrous sulfate 325 (65 FE) MG EC tablet Take 325 mg by mouth 3 (three) times daily with meals.   Yes [provider]  fexofenadine (ALLEGRA ALLERGY) 180 MG tablet Take 1 tablet (180 mg total) by mouth daily for 15 days. 04/26/22 05/11/22 Yes Eliezer Lofts, FNP  Multiple Vitamins-Minerals (ONE-A-DAY WOMENS 50+ ADVANTAGE PO) Take 1 tablet by mouth daily with breakfast.   Yes [provider]  mycophenolate (CELLCEPT) 500 MG tablet Take by mouth 2 (two) times daily.   Yes [provider]  Omega-3 1000 MG CAPS Take 1,000 mg by  mouth daily with breakfast.   Yes [provider]  pantoprazole (PROTONIX) 40 MG tablet Take 40 mg by mouth daily before breakfast. 02/16/19  Yes [provider]  potassium chloride SA (KLOR-CON) 20 MEQ tablet Take 20 mEq by mouth 2 (two) times daily.   Yes [provider]  predniSONE (DELTASONE) 5 MG tablet Take 5 mg by mouth daily with breakfast.   Yes [provider]  promethazine-dextromethorphan (PROMETHAZINE-DM) 6.25-15 MG/5ML syrup Take 5 mLs by mouth 4 (four) times daily as needed for cough. 04/26/22  Yes Eliezer Lofts, FNP  sodium bicarbonate 650 MG tablet Take 1,300 mg by  mouth 3 (three) times daily.   Yes [provider]  SYNTHROID 137 MCG tablet Take 137 mcg by mouth daily before breakfast.  01/25/13  Yes [provider]  tacrolimus (PROGRAF) 1 MG capsule Take 1 mg by mouth in the morning and at bedtime.  02/27/13  Yes [provider]  Vitamin D, Ergocalciferol, (DRISDOL) 50000 units CAPS capsule Take 50,000 Units by mouth every Sunday.  03/29/15  Yes [provider]  loperamide (IMODIUM A-D) 2 MG tablet Take 2 mg by mouth 3 (three) times daily as needed for diarrhea or loose stools.    [provider]    Family History Family History  Problem Relation Age of Onset   Brain cancer Mother    Diabetes type II Father    Parkinson's disease Father    Breast cancer Sister 21    Social History Social History   Tobacco Use   Smoking status: Former    Packs/day: 0.50    Years: 2.00    Additional pack years: 0.00    Total pack years: 1.00    Types: Cigarettes   Smokeless tobacco: Never  Vaping Use   Vaping Use: Never used  Substance Use Topics   Alcohol use: No   Drug use: No     Allergies   Codeine   Review of Systems Review of Systems  HENT:  Positive for congestion and sore throat.   Respiratory:  Positive for cough.   All other systems reviewed and are negative.    Physical Exam Triage Vital Signs ED Triage Vitals  Enc Vitals Group     BP 04/26/22 1458 (!) 140/83     Pulse Rate 04/26/22 1458 68     Resp 04/26/22 1458 18     Temp 04/26/22 1458 98.4 F (36.9 C)     Temp Source 04/26/22 1458 Oral     SpO2 04/26/22 1458 97 %     Weight 04/26/22 1457 127 lb (57.6 kg)     Height 04/26/22 1457 5\' 2"  (1.575 m)     Head Circumference --      Peak Flow --      Pain Score 04/26/22 1454 8     Pain Loc --      Pain Edu? --      Excl. in [place]? --    No data found.  Updated Vital Signs BP (!) 140/83 (BP Location: Left Arm)   Pulse 68   Temp 98.4 F (36.9 C) (Oral)   Resp 18   Ht 5\' 2"   (1.575 m)   Wt 127 lb (57.6 kg)   LMP 01/16/2012   SpO2 97%   BMI 23.23 kg/m   Visual Acuity Right Eye Distance:   Left Eye Distance:   Bilateral Distance:    Right Eye Near:   Left Eye Near:  Bilateral Near:     Physical Exam Vitals reviewed.  Constitutional:      Appearance: Normal appearance. She is normal weight.  HENT:     Head: Normocephalic and atraumatic.     Right Ear: Tympanic membrane, ear canal and external ear normal.     Left Ear: Tympanic membrane, ear canal and external ear normal.     Mouth/Throat:     Mouth: Mucous membranes are moist.     Pharynx: Oropharynx is clear.     Comments: Significant amount of clear drainage of posterior oropharynx noted Eyes:     Extraocular Movements: Extraocular movements intact.     Conjunctiva/sclera: Conjunctivae normal.     Pupils: Pupils are equal, round, and reactive to light.  Cardiovascular:     Rate and Rhythm: Normal rate and regular rhythm.     Pulses: Normal pulses.     Heart sounds: Normal heart sounds.  Pulmonary:     Effort: Pulmonary effort is normal.     Breath sounds: Normal breath sounds. No wheezing, rhonchi or rales.     Comments: Infrequent nonproductive cough noted on exam Musculoskeletal:        General: Normal range of motion.     Cervical back: Normal range of motion and neck supple. No tenderness.  Lymphadenopathy:     Cervical: No cervical adenopathy.  Skin:    General: Skin is warm and dry.  Neurological:     General: No focal deficit present.     Mental Status: She is alert and oriented to person, place, and time. Mental status is at baseline.      UC Treatments / Results  Labs (all labs ordered are listed, but only abnormal results are displayed) Labs Reviewed - No data to display  EKG   Radiology No results found.  Procedures Procedures (including critical care time)  Medications Ordered in UC Medications - No data to display  Initial Impression / Assessment and  Plan / UC Course  I have reviewed the triage vital signs and the nursing notes.  Pertinent labs & imaging results that were available during my care of the patient were reviewed by me and considered in my medical decision making (see chart for details).     MDM: 1.  Pharyngitis-Rx'd Zithromax 500 mg day 1, then 250 mg x 4 days; 2.  Cough-same as 1.  Rx'd Zithromax, Promethazine DM Directed patient to take medications as directed with food to completion.  Advised may take promethazine daily or as needed for cough.  3.  Allergic rhinitis-Rx'd fexofenadine 180 mg daily for 5 days, then as needed. Encouraged increase daily water intake while taking these medications.  Advised if symptoms worsen and/or unresolved please follow-up with PCP or here for further evaluation. Final Clinical Impressions(s) / UC Diagnoses   Final diagnoses:  Acute pharyngitis, unspecified etiology  Cough, unspecified type  Allergic rhinitis, unspecified seasonality, unspecified trigger     Discharge Instructions      Instructed patient to take medications as directed with food to completion.  Advised patient to take Allegra with Zithromax daily for the next 5 days for concurrent postnasal drainage/drip.  Advised may use as needed afterwards.  Advised may take promethazine daily or as needed for cough.  Encouraged increase daily water intake while taking these medications.  Advised if symptoms worsen and/or unresolved please follow-up with PCP or here for further evaluation.     ED Prescriptions     Medication Sig Dispense Auth. Provider  azithromycin (ZITHROMAX) 250 MG tablet Take 1 tablet (250 mg total) by mouth daily. Take first 2 tablets together, then 1 every day until finished. 6 tablet Eliezer Lofts, FNP   promethazine-dextromethorphan (PROMETHAZINE-DM) 6.25-15 MG/5ML syrup Take 5 mLs by mouth 4 (four) times daily as needed for cough. 118 mL Eliezer Lofts, FNP   fexofenadine [facility] ALLERGY) 180 MG tablet  Take 1 tablet (180 mg total) by mouth daily for 15 days. 15 tablet Eliezer Lofts, FNP      PDMP not reviewed this encounter.   Eliezer Lofts, [place] 04/26/22 1534

## 2022-04-26 NOTE — Discharge Instructions (Addendum)
Instructed patient to take medications as directed with food to completion.  Advised patient to take Allegra with Zithromax daily for the next 5 days for concurrent postnasal drainage/drip.  Advised may use as needed afterwards.  Advised may take promethazine daily or as needed for cough.  Encouraged increase daily water intake while taking these medications.  Advised if symptoms worsen and/or unresolved please follow-up with PCP or here for further evaluation.

## 2022-04-29 ENCOUNTER — Ambulatory Visit (HOSPITAL_COMMUNITY)
Admission: EM | Admit: 2022-04-29 | Discharge: 2022-04-29 | Disposition: A | Discharge status: Home / Self Care | Payer: BC Managed Care – PPO | Attending: Emergency Medicine | Admitting: Emergency Medicine

## 2022-04-29 ENCOUNTER — Encounter (HOSPITAL_COMMUNITY): Payer: Self-pay | Admitting: *Deleted

## 2022-04-29 DIAGNOSIS — J069 Acute upper respiratory infection, unspecified: Secondary | ICD-10-CM

## 2022-04-29 MED ORDER — ALBUTEROL SULFATE HFA 108 (90 BASE) MCG/ACT IN AERS: 2.0000 | INHALATION_SPRAY | Freq: Four times a day (QID) | RESPIRATORY_TRACT | 1 refills | Status: AC | PRN

## 2022-04-29 MED ORDER — PREDNISONE 20 MG PO TABS: 40.0000 mg | ORAL_TABLET | Freq: Every day | ORAL | 0 refills | Status: AC

## 2022-04-29 MED ORDER — DEXTROMETHORPHAN POLISTIREX ER 30 MG/5ML PO SUER: 30.0000 mg | Freq: Three times a day (TID) | ORAL | 0 refills | Status: AC | PRN

## 2022-04-29 NOTE — ED Triage Notes (Signed)
Pt states she was seen 04/26/2022 she is taking meds given and she feels worse than she did before. She is coughing so much her sides and chest are hurting.

## 2022-04-29 NOTE — Discharge Instructions (Addendum)
Please use the inhaler 3 times daily (every 6 hours) for the next several days.  Prednisone 40 mg daily for the next 5 days  You can use the cough syrup 2-3 times daily  Make sure you are drinking lots of fluids  Please return if symptoms are still worsening!

## 2022-04-29 NOTE — ED Provider Notes (Signed)
[facility]    CSN: [id number] Arrival date & time: 04/29/22  1601     History   Chief Complaint Chief Complaint  Patient presents with   Cough   Nasal Congestion   Chest Pain    HPI Arletha Trevithick Gahan is a 62 y.o. female.  Here with productive cough and chest discomfort from coughing She was seen 3 days ago diagnosed with acute pharyngitis, cough, and placed on azithromycin.  She has also been using promethazine and Allegra.  Reports symptoms have worsened; rib cage hurts with each cough and cough is more frequent No fevers. No chest pain at rest; only with cough Denies shortness of breath or wheezing Former smoker  No history of lung problems  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   Age 89 (1983)   Breast cancer    H/O kidney transplant 03/16/2013   History of left breast cancer 03/16/2013   Hx gestational diabetes 03/16/2013   Hx of primary hypertension 03/16/2013   Hypertension    Hypomagnesemia    Kidney transplant recipient    Personal history of radiation therapy     Patient Active Problem List   Diagnosis Date Noted   Infection due to Norovirus species 04/19/2019   Chronic diarrhea 04/15/2019   Hypomagnesemia 04/15/2019   Hypocalcemia [id number]   Metabolic acidosis, NAG, bicarbonate losses 04/15/2019   Cellulitis 04/21/2015   Hypertension 04/21/2015   Hyponatremia 04/21/2015   Infected wound 04/21/2015   Breast cancer, left breast ([place]) 04/18/2014   Anemia in neoplastic disease 04/18/2014   History of left breast cancer 03/16/2013   Anemia in chronic renal disease 03/16/2013   H/O kidney transplant 03/16/2013   Brain aneurysm 03/16/2013   Hx of primary hypertension 03/16/2013   Hx gestational diabetes 03/16/2013    Past Surgical History:  Procedure Laterality Date   BREAST BIOPSY Right    BREAST LUMPECTOMY Left    2011   CEREBRAL ANEURYSM REPAIR   01/28/1979   CLIP IN PLACE AND UNSAFE FOR MRI PER RADIOLOGIST 03/25/22   CESAREAN SECTION     KIDNEY TRANSPLANT      OB History   No obstetric history on file.      Home Medications    Prior to Admission medications   Medication Sig Start Date End Date Taking? Authorizing Provider  albuterol (VENTOLIN HFA) 108 (90 Base) MCG/ACT inhaler Inhale 2 puffs into the lungs every 6 (six) hours as needed for wheezing or shortness of breath. 04/29/22  Yes [place] Mckeough, Wells Guiles, PA-C  atenolol (TENORMIN) 25 MG tablet Take 25 mg by mouth daily.  06/20/15  Yes [provider]  azithromycin (ZITHROMAX) 250 MG tablet Take 1 tablet (250 mg total) by mouth daily. Take first 2 tablets together, then 1 every day until finished. 04/26/22  Yes Eliezer Lofts, FNP  Biotin 5000 MCG TABS Take by mouth.   Yes [provider]  calcitRIOL (ROCALTROL) 0.25 MCG capsule Take 0.25 mcg by mouth daily. 10/10/18  Yes [provider]  [employer] VITAMIN B12 1000 MCG tablet Take 2,000 mcg by mouth daily. 04/03/15  Yes [provider]  dextromethorphan (DELSYM) 30 MG/5ML liquid Take 5 mLs (30 mg total) by mouth 3 (three) times daily as needed for cough. 04/29/22  Yes Alexismarie Flaim, Wells Guiles, PA-C  fenofibrate micronized (ANTARA) 130 MG capsule Take 130 mg by mouth daily before breakfast.   Yes [provider]  ferrous sulfate  325 (65 FE) MG EC tablet Take 325 mg by mouth 3 (three) times daily with meals.   Yes [provider]  fexofenadine (ALLEGRA ALLERGY) 180 MG tablet Take 1 tablet (180 mg total) by mouth daily for 15 days. 04/26/22 05/11/22 Yes Eliezer Lofts, FNP  loperamide (IMODIUM A-D) 2 MG tablet Take 2 mg by mouth 3 (three) times daily as needed for diarrhea or loose stools.   Yes [provider]  Multiple Vitamins-Minerals (ONE-A-DAY WOMENS 50+ ADVANTAGE PO) Take 1 tablet by mouth daily with breakfast.   Yes [provider]  mycophenolate (CELLCEPT) 500 MG tablet Take by mouth  2 (two) times daily.   Yes [provider]  Omega-3 1000 MG CAPS Take 1,000 mg by mouth daily with breakfast.   Yes [provider]  pantoprazole (PROTONIX) 40 MG tablet Take 40 mg by mouth daily before breakfast. 02/16/19  Yes [provider]  potassium chloride SA (KLOR-CON) 20 MEQ tablet Take 20 mEq by mouth 2 (two) times daily.   Yes [provider]  predniSONE (DELTASONE) 20 MG tablet Take 2 tablets (40 mg total) by mouth daily with breakfast for 5 days. 04/29/22 05/04/22 Yes Seana Underwood, Wells Guiles, PA-C  sodium bicarbonate 650 MG tablet Take 1,300 mg by mouth 3 (three) times daily.   Yes [provider]  SYNTHROID 137 MCG tablet Take 137 mcg by mouth daily before breakfast.  01/25/13  Yes [provider]  tacrolimus (PROGRAF) 1 MG capsule Take 1 mg by mouth in the morning and at bedtime.  02/27/13  Yes [provider]  Vitamin D, Ergocalciferol, (DRISDOL) 50000 units CAPS capsule Take 50,000 Units by mouth every Sunday.  03/29/15  Yes [provider]    Family History Family History  Problem Relation Age of Onset   Brain cancer Mother    Diabetes type II Father    Parkinson's disease Father    Breast cancer Sister 25    Social History Social History   Tobacco Use   Smoking status: Former    Packs/day: 0.50    Years: 2.00    Additional pack years: 0.00    Total pack years: 1.00    Types: Cigarettes   Smokeless tobacco: Never  Vaping Use   Vaping Use: Never used  Substance Use Topics   Alcohol use: No   Drug use: No     Allergies   Codeine   Review of Systems Review of Systems As per HPI  Physical Exam Triage Vital Signs ED Triage Vitals  Enc Vitals Group     BP 04/29/22 1748 138/83     Pulse Rate 04/29/22 1748 63     Resp 04/29/22 1748 20     Temp 04/29/22 1748 98.7 F (37.1 C)     Temp Source 04/29/22 1748 Oral     SpO2 04/29/22 1748 96 %     Weight --      Height --      Head Circumference --       Peak Flow --      Pain Score 04/29/22 1746 8     Pain Loc --      Pain Edu? --      Excl. in [place]? --    No data found.  Updated Vital Signs BP 138/83 (BP Location: Left Arm)   Pulse 63   Temp 98.7 F (37.1 C) (Oral)   Resp 20   LMP 01/16/2012   SpO2 96%    Physical  Exam Vitals and nursing note reviewed.  Constitutional:      General: She is not in acute distress.    Appearance: She is not ill-appearing.  HENT:     Right Ear: Tympanic membrane and ear canal normal.     Left Ear: Tympanic membrane and ear canal normal.     Nose: No congestion or rhinorrhea.     Mouth/Throat:     Mouth: Mucous membranes are moist.     Pharynx: Oropharynx is clear. No posterior oropharyngeal erythema.  Eyes:     Conjunctiva/sclera: Conjunctivae normal.  Cardiovascular:     Rate and Rhythm: Normal rate and regular rhythm.     Pulses: Normal pulses.     Heart sounds: Normal heart sounds.  Pulmonary:     Effort: Pulmonary effort is normal.     Breath sounds: Normal breath sounds.  Musculoskeletal:     Cervical back: Normal range of motion.  Lymphadenopathy:     Cervical: No cervical adenopathy.  Skin:    General: Skin is warm and dry.  Neurological:     Mental Status: She is alert and oriented to person, place, and time.    UC Treatments / Results  Labs (all labs ordered are listed, but only abnormal results are displayed) Labs Reviewed - No data to display  EKG  Radiology No results found.  Procedures Procedures   Medications Ordered in UC Medications - No data to display  Initial Impression / Assessment and Plan / UC Course  I have reviewed the triage vital signs and the nursing notes.  Pertinent labs & imaging results that were available during my care of the patient were reviewed by me and considered in my medical decision making (see chart for details).  Afebrile and well-appearing, occasional dry cough in clinic Discussed her symptoms are likely viral in  etiology and does not need to take any azithromycin.  Instead I recommend she use albuterol inhaler every 6 hours for the next several days, prednisone 40 mg once daily x5. Dextromethorphan 2-3 times daily. Recommend Tylenol for likely costochondritis.  Pain is only with cough and I have low concern for underlying cardiac abnormality. We did discuss if symptoms continue to worsen she needs to be evaluated again in clinic. Patient agrees to plan  Final Clinical Impressions(s) / UC Diagnoses   Final diagnoses:  Viral URI with cough     Discharge Instructions      Please use the inhaler 3 times daily (every 6 hours) for the next several days.  Prednisone 40 mg daily for the next 5 days  You can use the cough syrup 2-3 times daily  Make sure you are drinking lots of fluids  Please return if symptoms are still worsening!     ED Prescriptions     Medication Sig Dispense Auth. Provider   predniSONE (DELTASONE) 20 MG tablet Take 2 tablets (40 mg total) by mouth daily with breakfast for 5 days. 10 tablet Tyrrell Stephens, PA-C   albuterol (VENTOLIN HFA) 108 (90 Base) MCG/ACT inhaler Inhale 2 puffs into the lungs every 6 (six) hours as needed for wheezing or shortness of breath. 8 g Aleicia Kenagy, PA-C   dextromethorphan (DELSYM) 30 MG/5ML liquid Take 5 mLs (30 mg total) by mouth 3 (three) times daily as needed for cough. 148 mL Daiveon Markman, Wells Guiles, PA-C      PDMP not reviewed this encounter.   [place]ou, [place] 04/29/22 [id number]

## 2022-05-05 DIAGNOSIS — L814 Other melanin hyperpigmentation: Secondary | ICD-10-CM | POA: Diagnosis not present

## 2022-05-09 DIAGNOSIS — S86012D Strain of left Achilles tendon, subsequent encounter: Secondary | ICD-10-CM | POA: Diagnosis not present

## 2022-05-20 DIAGNOSIS — R262 Difficulty in walking, not elsewhere classified: Secondary | ICD-10-CM | POA: Diagnosis not present

## 2022-05-20 DIAGNOSIS — M79605 Pain in left leg: Secondary | ICD-10-CM | POA: Diagnosis not present

## 2022-05-22 DIAGNOSIS — R262 Difficulty in walking, not elsewhere classified: Secondary | ICD-10-CM | POA: Diagnosis not present

## 2022-05-22 DIAGNOSIS — M79605 Pain in left leg: Secondary | ICD-10-CM | POA: Diagnosis not present

## 2022-05-27 DIAGNOSIS — M79605 Pain in left leg: Secondary | ICD-10-CM | POA: Diagnosis not present

## 2022-05-27 DIAGNOSIS — R262 Difficulty in walking, not elsewhere classified: Secondary | ICD-10-CM | POA: Diagnosis not present

## 2022-05-29 DIAGNOSIS — R262 Difficulty in walking, not elsewhere classified: Secondary | ICD-10-CM | POA: Diagnosis not present

## 2022-05-29 DIAGNOSIS — M79605 Pain in left leg: Secondary | ICD-10-CM | POA: Diagnosis not present

## 2022-06-02 DIAGNOSIS — R262 Difficulty in walking, not elsewhere classified: Secondary | ICD-10-CM | POA: Diagnosis not present

## 2022-06-02 DIAGNOSIS — M79605 Pain in left leg: Secondary | ICD-10-CM | POA: Diagnosis not present

## 2022-06-04 DIAGNOSIS — M79605 Pain in left leg: Secondary | ICD-10-CM | POA: Diagnosis not present

## 2022-06-04 DIAGNOSIS — R262 Difficulty in walking, not elsewhere classified: Secondary | ICD-10-CM | POA: Diagnosis not present

## 2022-06-10 DIAGNOSIS — M79605 Pain in left leg: Secondary | ICD-10-CM | POA: Diagnosis not present

## 2022-06-10 DIAGNOSIS — R262 Difficulty in walking, not elsewhere classified: Secondary | ICD-10-CM | POA: Diagnosis not present

## 2022-06-11 DIAGNOSIS — M79605 Pain in left leg: Secondary | ICD-10-CM | POA: Diagnosis not present

## 2022-06-11 DIAGNOSIS — R262 Difficulty in walking, not elsewhere classified: Secondary | ICD-10-CM | POA: Diagnosis not present

## 2022-06-16 DIAGNOSIS — R262 Difficulty in walking, not elsewhere classified: Secondary | ICD-10-CM | POA: Diagnosis not present

## 2022-06-16 DIAGNOSIS — M79605 Pain in left leg: Secondary | ICD-10-CM | POA: Diagnosis not present

## 2022-06-18 DIAGNOSIS — R262 Difficulty in walking, not elsewhere classified: Secondary | ICD-10-CM | POA: Diagnosis not present

## 2022-06-18 DIAGNOSIS — M79605 Pain in left leg: Secondary | ICD-10-CM | POA: Diagnosis not present

## 2022-06-20 DIAGNOSIS — M79605 Pain in left leg: Secondary | ICD-10-CM | POA: Diagnosis not present

## 2022-06-25 DIAGNOSIS — R262 Difficulty in walking, not elsewhere classified: Secondary | ICD-10-CM | POA: Diagnosis not present

## 2022-06-25 DIAGNOSIS — M79605 Pain in left leg: Secondary | ICD-10-CM | POA: Diagnosis not present

## 2022-06-27 DIAGNOSIS — Z940 Kidney transplant status: Secondary | ICD-10-CM | POA: Diagnosis not present

## 2022-07-01 DIAGNOSIS — M79605 Pain in left leg: Secondary | ICD-10-CM | POA: Diagnosis not present

## 2022-07-01 DIAGNOSIS — R262 Difficulty in walking, not elsewhere classified: Secondary | ICD-10-CM | POA: Diagnosis not present

## 2022-07-08 DIAGNOSIS — M79605 Pain in left leg: Secondary | ICD-10-CM | POA: Diagnosis not present

## 2022-07-08 DIAGNOSIS — R262 Difficulty in walking, not elsewhere classified: Secondary | ICD-10-CM | POA: Diagnosis not present

## 2022-07-10 DIAGNOSIS — Z85048 Personal history of other malignant neoplasm of rectum, rectosigmoid junction, and anus: Secondary | ICD-10-CM | POA: Diagnosis not present

## 2022-07-10 DIAGNOSIS — Z9012 Acquired absence of left breast and nipple: Secondary | ICD-10-CM | POA: Diagnosis not present

## 2022-07-10 DIAGNOSIS — Z853 Personal history of malignant neoplasm of breast: Secondary | ICD-10-CM | POA: Diagnosis not present

## 2022-07-10 DIAGNOSIS — C210 Malignant neoplasm of anus, unspecified: Secondary | ICD-10-CM | POA: Diagnosis not present

## 2022-07-10 DIAGNOSIS — Q632 Ectopic kidney: Secondary | ICD-10-CM | POA: Diagnosis not present

## 2022-07-10 DIAGNOSIS — Z940 Kidney transplant status: Secondary | ICD-10-CM | POA: Diagnosis not present

## 2022-07-10 DIAGNOSIS — Z08 Encounter for follow-up examination after completed treatment for malignant neoplasm: Secondary | ICD-10-CM | POA: Diagnosis not present

## 2022-07-10 DIAGNOSIS — Z923 Personal history of irradiation: Secondary | ICD-10-CM | POA: Diagnosis not present

## 2022-07-10 DIAGNOSIS — Z9229 Personal history of other drug therapy: Secondary | ICD-10-CM | POA: Diagnosis not present

## 2022-07-16 DIAGNOSIS — M79605 Pain in left leg: Secondary | ICD-10-CM | POA: Diagnosis not present

## 2022-07-16 DIAGNOSIS — R262 Difficulty in walking, not elsewhere classified: Secondary | ICD-10-CM | POA: Diagnosis not present

## 2022-07-17 DIAGNOSIS — E782 Mixed hyperlipidemia: Secondary | ICD-10-CM | POA: Diagnosis not present

## 2022-07-17 DIAGNOSIS — E039 Hypothyroidism, unspecified: Secondary | ICD-10-CM | POA: Diagnosis not present

## 2022-07-17 DIAGNOSIS — E119 Type 2 diabetes mellitus without complications: Secondary | ICD-10-CM | POA: Diagnosis not present

## 2022-07-18 DIAGNOSIS — E1122 Type 2 diabetes mellitus with diabetic chronic kidney disease: Secondary | ICD-10-CM | POA: Diagnosis not present

## 2022-07-18 DIAGNOSIS — Z940 Kidney transplant status: Secondary | ICD-10-CM | POA: Diagnosis not present

## 2022-07-18 DIAGNOSIS — Z905 Acquired absence of kidney: Secondary | ICD-10-CM | POA: Diagnosis not present

## 2022-07-18 DIAGNOSIS — E119 Type 2 diabetes mellitus without complications: Secondary | ICD-10-CM | POA: Diagnosis not present

## 2022-07-18 DIAGNOSIS — E1169 Type 2 diabetes mellitus with other specified complication: Secondary | ICD-10-CM | POA: Diagnosis not present

## 2022-07-18 DIAGNOSIS — E782 Mixed hyperlipidemia: Secondary | ICD-10-CM | POA: Diagnosis not present

## 2022-07-18 DIAGNOSIS — E039 Hypothyroidism, unspecified: Secondary | ICD-10-CM | POA: Diagnosis not present

## 2022-07-21 DIAGNOSIS — M7662 Achilles tendinitis, left leg: Secondary | ICD-10-CM | POA: Diagnosis not present

## 2022-07-23 IMAGING — MG MM DIGITAL SCREENING BILAT W/ TOMO AND CAD
8 series · 9 of 24 positions shown · non-contrast
Comparison: Previous exams.

CLINICAL DATA: Screening.

EXAM:
DIGITAL SCREENING BILATERAL MAMMOGRAM WITH TOMOSYNTHESIS AND CAD
TECHNIQUE: Bilateral screening digital craniocaudal and mediolateral oblique
mammograms were obtained. Bilateral screening digital breast
tomosynthesis was performed. The images were evaluated with
computer-aided detection.

[L CC synth-2D]
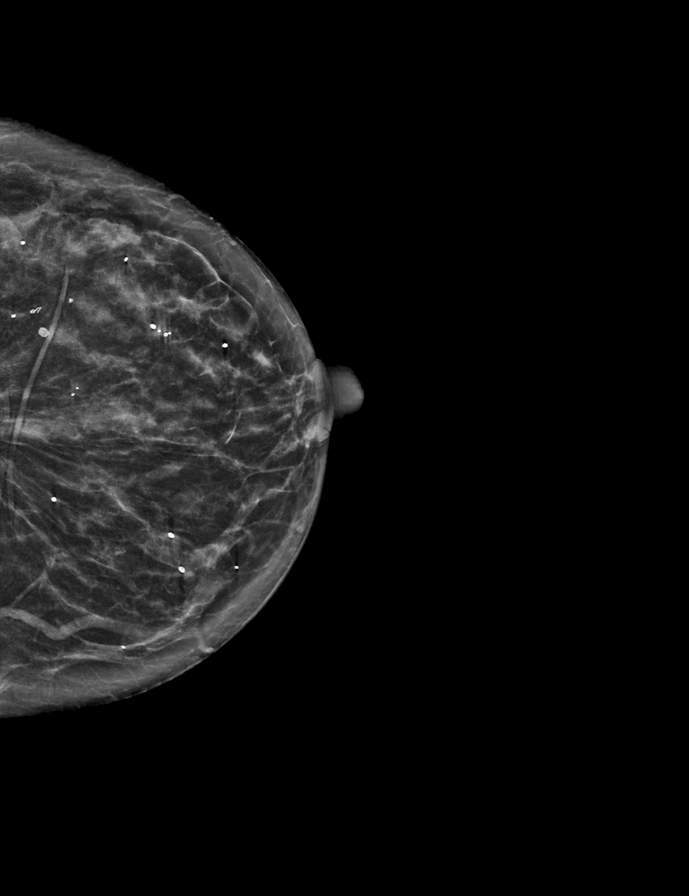

[R CC synth-2D]
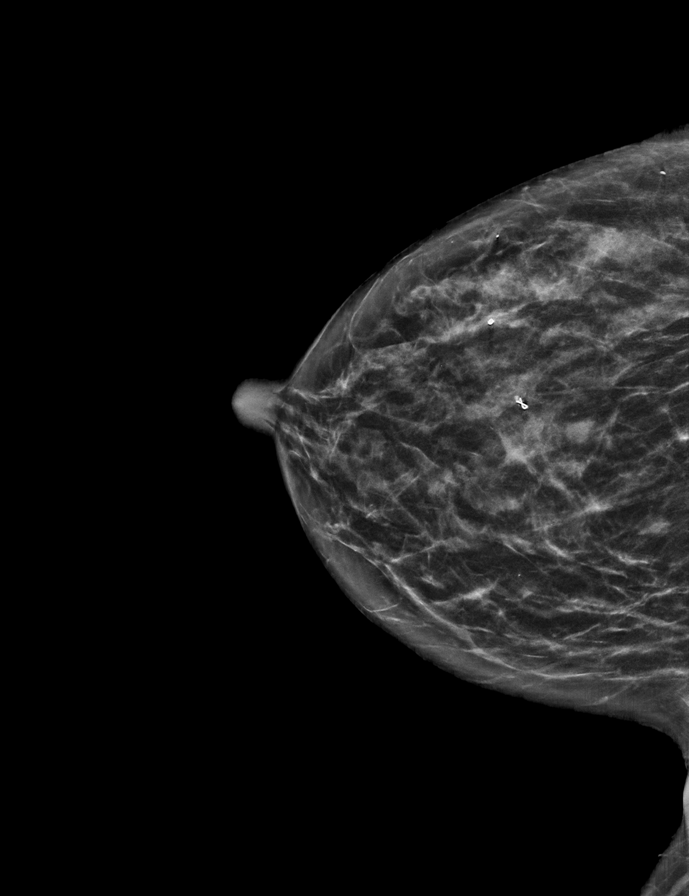

[R MLO synth-2D]
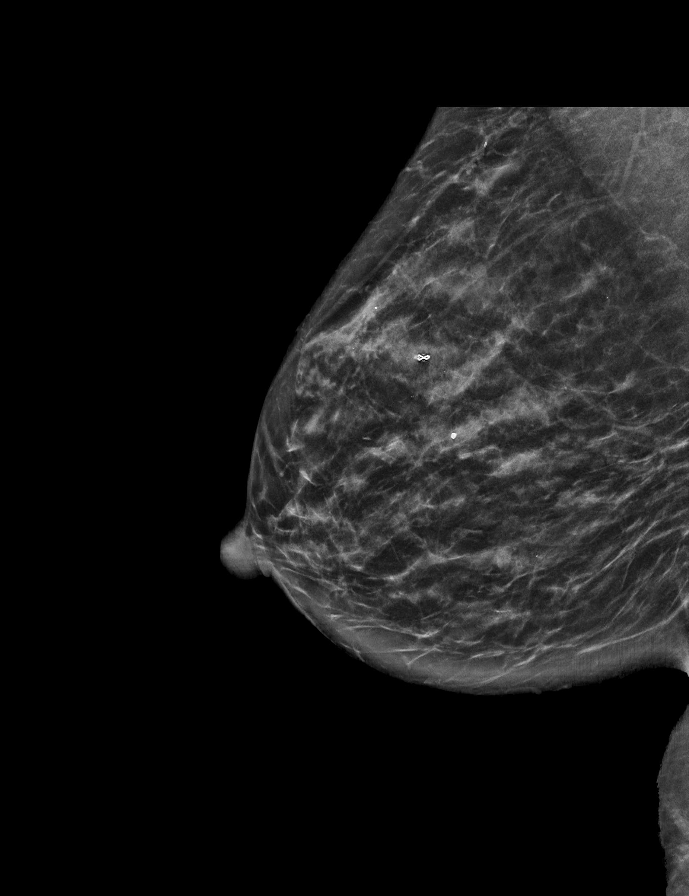

[L MLO synth-2D]
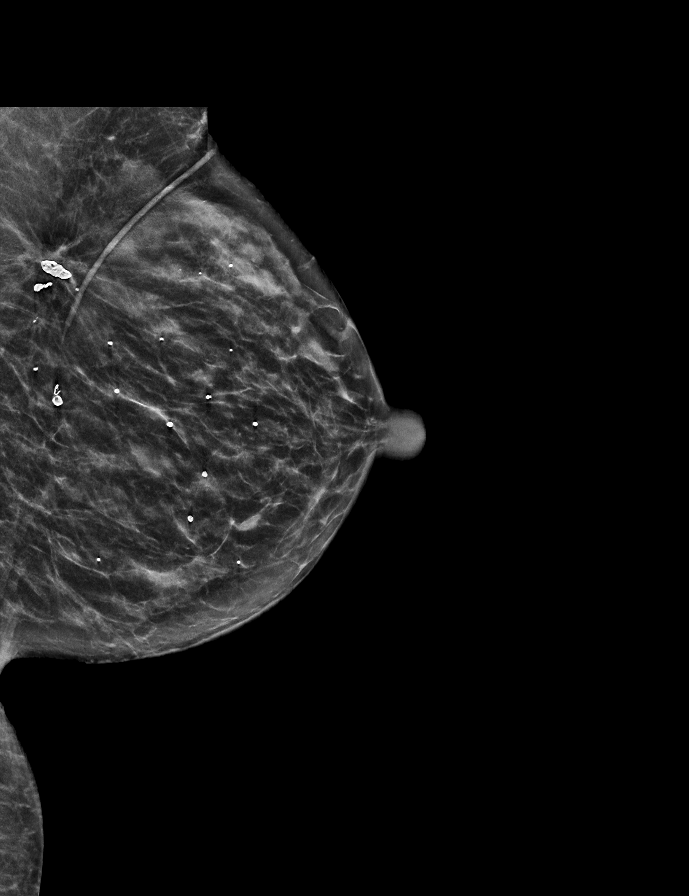

[R CC tomo · 2 of 50 frames shown]
[frame 17/50]
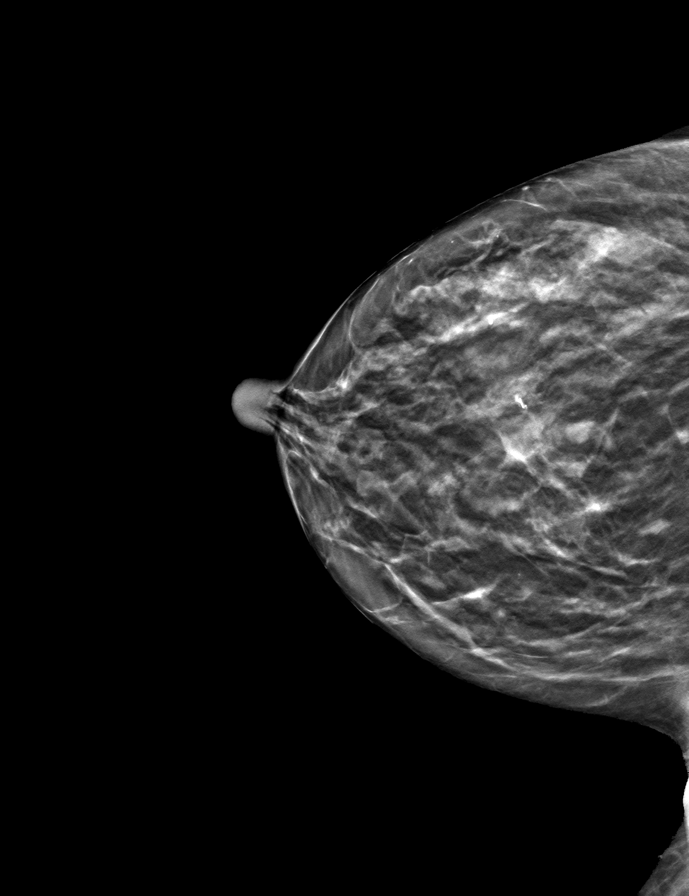
[frame 25/50]
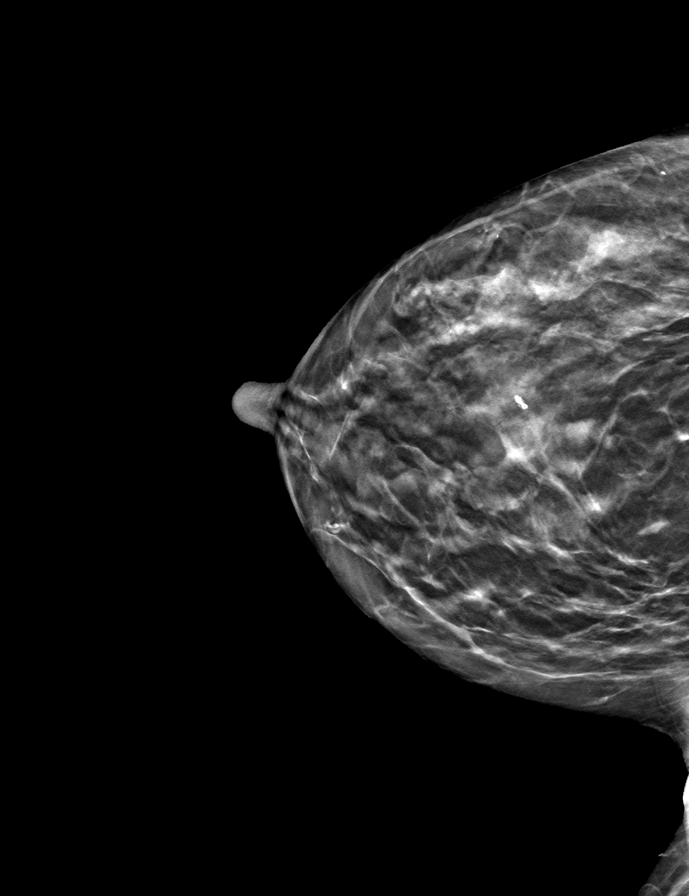

[R MLO tomo · tomo slice 26/51.0]
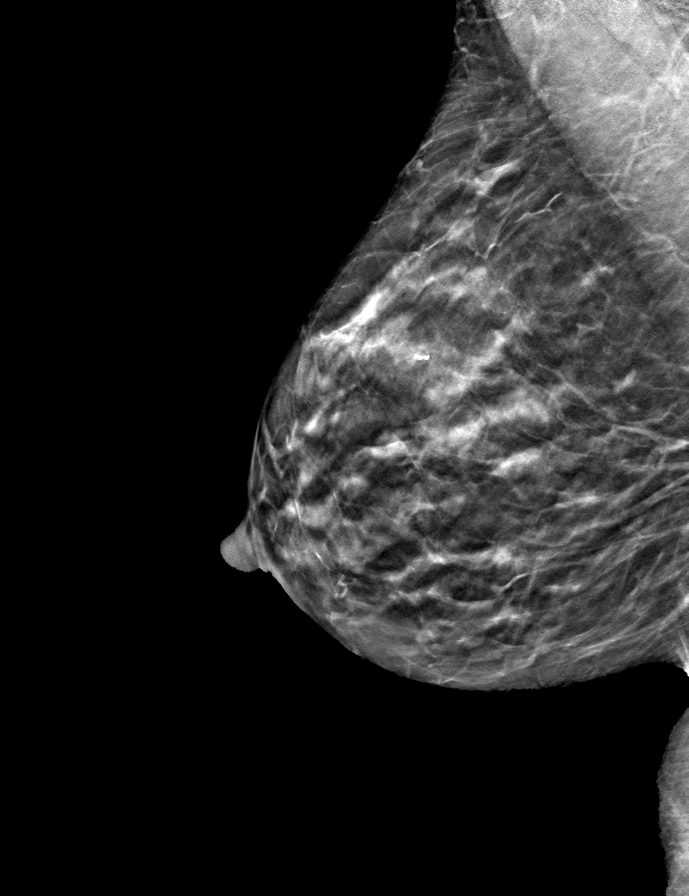

[L CC tomo · tomo slice 25/50.0]
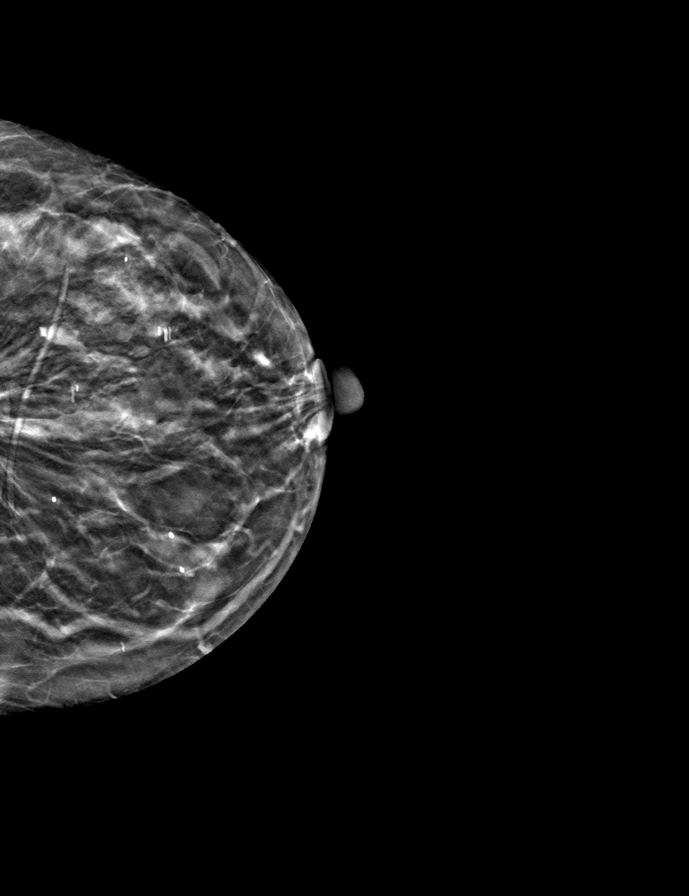

[L MLO tomo · tomo slice 25/48.0]
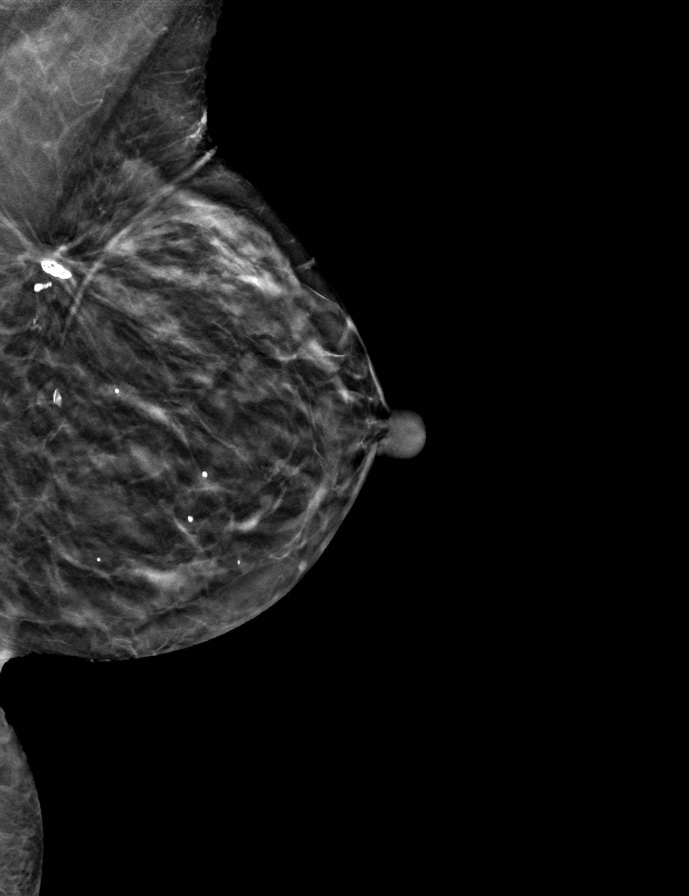

[9 of 24 positions shown; findings below may reference images not displayed]

ACR Breast Density Category c: The breast tissue is heterogeneously
dense, which may obscure small masses.
FINDINGS: In the right breast, a possible mass warrants further evaluation. In
the left breast, no findings suspicious for malignancy.
IMPRESSION: Further evaluation is suggested for a possible mass in the right
breast.

RECOMMENDATION:
Diagnostic mammogram and possibly ultrasound of the right breast.
(Code:79-3-33E)

The patient will be contacted regarding the findings, and additional
imaging will be scheduled.

BI-RADS CATEGORY  0: Incomplete. Need additional imaging evaluation
and/or prior mammograms for comparison.

## 2022-07-25 DIAGNOSIS — M79605 Pain in left leg: Secondary | ICD-10-CM | POA: Diagnosis not present

## 2022-07-25 DIAGNOSIS — R262 Difficulty in walking, not elsewhere classified: Secondary | ICD-10-CM | POA: Diagnosis not present

## 2022-07-29 DIAGNOSIS — M79605 Pain in left leg: Secondary | ICD-10-CM | POA: Diagnosis not present

## 2022-07-29 DIAGNOSIS — R262 Difficulty in walking, not elsewhere classified: Secondary | ICD-10-CM | POA: Diagnosis not present

## 2022-07-30 DIAGNOSIS — C20 Malignant neoplasm of rectum: Secondary | ICD-10-CM | POA: Diagnosis not present

## 2022-07-30 DIAGNOSIS — D638 Anemia in other chronic diseases classified elsewhere: Secondary | ICD-10-CM | POA: Diagnosis not present

## 2022-09-03 DIAGNOSIS — Z940 Kidney transplant status: Secondary | ICD-10-CM | POA: Diagnosis not present

## 2022-09-03 DIAGNOSIS — N189 Chronic kidney disease, unspecified: Secondary | ICD-10-CM | POA: Diagnosis not present

## 2022-09-09 DIAGNOSIS — I10 Essential (primary) hypertension: Secondary | ICD-10-CM | POA: Diagnosis not present

## 2022-09-09 DIAGNOSIS — N2581 Secondary hyperparathyroidism of renal origin: Secondary | ICD-10-CM | POA: Diagnosis not present

## 2022-09-09 DIAGNOSIS — N189 Chronic kidney disease, unspecified: Secondary | ICD-10-CM | POA: Diagnosis not present

## 2022-09-09 DIAGNOSIS — Z940 Kidney transplant status: Secondary | ICD-10-CM | POA: Diagnosis not present

## 2022-09-11 DIAGNOSIS — C210 Malignant neoplasm of anus, unspecified: Secondary | ICD-10-CM | POA: Diagnosis not present

## 2022-09-11 DIAGNOSIS — I700 Atherosclerosis of aorta: Secondary | ICD-10-CM | POA: Diagnosis not present

## 2022-09-11 DIAGNOSIS — Z940 Kidney transplant status: Secondary | ICD-10-CM | POA: Diagnosis not present

## 2022-09-11 DIAGNOSIS — K439 Ventral hernia without obstruction or gangrene: Secondary | ICD-10-CM | POA: Diagnosis not present

## 2022-09-11 DIAGNOSIS — E041 Nontoxic single thyroid nodule: Secondary | ICD-10-CM | POA: Diagnosis not present

## 2022-09-15 DIAGNOSIS — M79605 Pain in left leg: Secondary | ICD-10-CM | POA: Diagnosis not present

## 2022-11-11 DIAGNOSIS — E871 Hypo-osmolality and hyponatremia: Secondary | ICD-10-CM | POA: Diagnosis not present

## 2022-11-11 DIAGNOSIS — Z940 Kidney transplant status: Secondary | ICD-10-CM | POA: Diagnosis not present

## 2023-01-26 DIAGNOSIS — Z08 Encounter for follow-up examination after completed treatment for malignant neoplasm: Secondary | ICD-10-CM | POA: Diagnosis not present

## 2023-01-26 DIAGNOSIS — C20 Malignant neoplasm of rectum: Secondary | ICD-10-CM | POA: Diagnosis not present

## 2023-01-26 DIAGNOSIS — Z853 Personal history of malignant neoplasm of breast: Secondary | ICD-10-CM | POA: Diagnosis not present

## 2023-01-26 DIAGNOSIS — Z85048 Personal history of other malignant neoplasm of rectum, rectosigmoid junction, and anus: Secondary | ICD-10-CM | POA: Diagnosis not present

## 2023-01-26 DIAGNOSIS — C210 Malignant neoplasm of anus, unspecified: Secondary | ICD-10-CM | POA: Diagnosis not present

## 2023-01-26 DIAGNOSIS — Z800 Family history of malignant neoplasm of digestive organs: Secondary | ICD-10-CM | POA: Diagnosis not present

## 2023-01-26 DIAGNOSIS — C50912 Malignant neoplasm of unspecified site of left female breast: Secondary | ICD-10-CM | POA: Diagnosis not present

## 2023-01-26 DIAGNOSIS — Z923 Personal history of irradiation: Secondary | ICD-10-CM | POA: Diagnosis not present

## 2023-04-01 ENCOUNTER — Other Ambulatory Visit: Payer: Self-pay | Admitting: Family Medicine

## 2023-04-01 DIAGNOSIS — Z1231 Encounter for screening mammogram for malignant neoplasm of breast: Secondary | ICD-10-CM

## 2023-04-06 DIAGNOSIS — Z85048 Personal history of other malignant neoplasm of rectum, rectosigmoid junction, and anus: Secondary | ICD-10-CM | POA: Diagnosis not present

## 2023-04-22 ENCOUNTER — Ambulatory Visit
Admission: RE | Admit: 2023-04-22 | Discharge: 2023-04-22 | Disposition: A | Discharge status: Home / Self Care | Source: Ambulatory Visit | Attending: Family Medicine | Admitting: Family Medicine

## 2023-04-22 DIAGNOSIS — Z1231 Encounter for screening mammogram for malignant neoplasm of breast: Secondary | ICD-10-CM | POA: Diagnosis not present

## 2023-05-26 DIAGNOSIS — E559 Vitamin D deficiency, unspecified: Secondary | ICD-10-CM | POA: Diagnosis not present

## 2023-05-28 DIAGNOSIS — Z940 Kidney transplant status: Secondary | ICD-10-CM | POA: Diagnosis not present

## 2023-05-28 DIAGNOSIS — I10 Essential (primary) hypertension: Secondary | ICD-10-CM | POA: Diagnosis not present

## 2023-05-28 DIAGNOSIS — N2581 Secondary hyperparathyroidism of renal origin: Secondary | ICD-10-CM | POA: Diagnosis not present

## 2023-05-28 DIAGNOSIS — N189 Chronic kidney disease, unspecified: Secondary | ICD-10-CM | POA: Diagnosis not present

## 2023-06-09 DIAGNOSIS — Z940 Kidney transplant status: Secondary | ICD-10-CM | POA: Diagnosis not present

## 2023-06-09 DIAGNOSIS — E871 Hypo-osmolality and hyponatremia: Secondary | ICD-10-CM | POA: Diagnosis not present

## 2023-06-09 DIAGNOSIS — N189 Chronic kidney disease, unspecified: Secondary | ICD-10-CM | POA: Diagnosis not present

## 2023-07-07 DIAGNOSIS — H43811 Vitreous degeneration, right eye: Secondary | ICD-10-CM | POA: Diagnosis not present

## 2023-07-07 DIAGNOSIS — Z961 Presence of intraocular lens: Secondary | ICD-10-CM | POA: Diagnosis not present

## 2023-07-27 DIAGNOSIS — Z923 Personal history of irradiation: Secondary | ICD-10-CM | POA: Diagnosis not present

## 2023-07-27 DIAGNOSIS — C210 Malignant neoplasm of anus, unspecified: Secondary | ICD-10-CM | POA: Diagnosis not present

## 2023-07-27 DIAGNOSIS — Z853 Personal history of malignant neoplasm of breast: Secondary | ICD-10-CM | POA: Diagnosis not present

## 2023-07-27 DIAGNOSIS — Z9889 Other specified postprocedural states: Secondary | ICD-10-CM | POA: Diagnosis not present

## 2023-07-27 DIAGNOSIS — Z08 Encounter for follow-up examination after completed treatment for malignant neoplasm: Secondary | ICD-10-CM | POA: Diagnosis not present

## 2023-07-27 DIAGNOSIS — Z85048 Personal history of other malignant neoplasm of rectum, rectosigmoid junction, and anus: Secondary | ICD-10-CM | POA: Diagnosis not present

## 2023-08-17 DIAGNOSIS — C210 Malignant neoplasm of anus, unspecified: Secondary | ICD-10-CM | POA: Diagnosis not present

## 2023-10-26 DIAGNOSIS — E1122 Type 2 diabetes mellitus with diabetic chronic kidney disease: Secondary | ICD-10-CM | POA: Diagnosis not present

## 2023-10-26 DIAGNOSIS — E041 Nontoxic single thyroid nodule: Secondary | ICD-10-CM | POA: Diagnosis not present

## 2023-10-26 DIAGNOSIS — N1831 Chronic kidney disease, stage 3a: Secondary | ICD-10-CM | POA: Diagnosis not present

## 2023-10-26 DIAGNOSIS — I129 Hypertensive chronic kidney disease with stage 1 through stage 4 chronic kidney disease, or unspecified chronic kidney disease: Secondary | ICD-10-CM | POA: Diagnosis not present

## 2023-12-07 DIAGNOSIS — N189 Chronic kidney disease, unspecified: Secondary | ICD-10-CM | POA: Diagnosis not present

## 2023-12-07 DIAGNOSIS — E871 Hypo-osmolality and hyponatremia: Secondary | ICD-10-CM | POA: Diagnosis not present

## 2023-12-07 DIAGNOSIS — Z940 Kidney transplant status: Secondary | ICD-10-CM | POA: Diagnosis not present

## 2023-12-07 DIAGNOSIS — Z23 Encounter for immunization: Secondary | ICD-10-CM | POA: Diagnosis not present

## 2023-12-07 DIAGNOSIS — I10 Essential (primary) hypertension: Secondary | ICD-10-CM | POA: Diagnosis not present

## 2023-12-07 DIAGNOSIS — E872 Acidosis, unspecified: Secondary | ICD-10-CM | POA: Diagnosis not present
